# Patient Record
Sex: Male | Born: 1985 | Race: White | Hispanic: No | Smoking: Current every day smoker
Health system: Southern US, Community
[De-identification: ages and names within clinical notes are randomized; demographics above are authoritative.]

## PROBLEM LIST (undated history)

## (undated) DIAGNOSIS — F1920 Other psychoactive substance dependence, uncomplicated: Secondary | ICD-10-CM

## (undated) DIAGNOSIS — B1920 Unspecified viral hepatitis C without hepatic coma: Secondary | ICD-10-CM

## (undated) DIAGNOSIS — F1910 Other psychoactive substance abuse, uncomplicated: Secondary | ICD-10-CM

## (undated) DIAGNOSIS — F17200 Nicotine dependence, unspecified, uncomplicated: Secondary | ICD-10-CM

## (undated) DIAGNOSIS — M4622 Osteomyelitis of vertebra, cervical region: Secondary | ICD-10-CM

## (undated) HISTORY — PX: ANKLE SURGERY: SHX546

---
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
## 2017-07-31 ENCOUNTER — Encounter (HOSPITAL_COMMUNITY): Payer: Self-pay | Admitting: Emergency Medicine

## 2017-07-31 ENCOUNTER — Other Ambulatory Visit: Payer: Self-pay

## 2017-07-31 ENCOUNTER — Emergency Department (HOSPITAL_COMMUNITY): Payer: Self-pay

## 2017-07-31 ENCOUNTER — Emergency Department (HOSPITAL_COMMUNITY)
Admission: EM | Admit: 2017-07-31 | Discharge: 2017-08-01 | Disposition: A | Discharge status: Home / Self Care | Payer: Self-pay | Attending: Emergency Medicine | Admitting: Emergency Medicine

## 2017-07-31 DIAGNOSIS — R748 Abnormal levels of other serum enzymes: Secondary | ICD-10-CM | POA: Insufficient documentation

## 2017-07-31 DIAGNOSIS — R451 Restlessness and agitation: Secondary | ICD-10-CM | POA: Insufficient documentation

## 2017-07-31 DIAGNOSIS — T50901A Poisoning by unspecified drugs, medicaments and biological substances, accidental (unintentional), initial encounter: Secondary | ICD-10-CM | POA: Insufficient documentation

## 2017-07-31 DIAGNOSIS — J181 Lobar pneumonia, unspecified organism: Secondary | ICD-10-CM | POA: Insufficient documentation

## 2017-07-31 DIAGNOSIS — F1910 Other psychoactive substance abuse, uncomplicated: Secondary | ICD-10-CM | POA: Insufficient documentation

## 2017-07-31 DIAGNOSIS — J189 Pneumonia, unspecified organism: Secondary | ICD-10-CM

## 2017-07-31 DIAGNOSIS — R062 Wheezing: Secondary | ICD-10-CM

## 2017-07-31 DIAGNOSIS — F17210 Nicotine dependence, cigarettes, uncomplicated: Secondary | ICD-10-CM | POA: Insufficient documentation

## 2017-07-31 LAB — RAPID URINE DRUG SCREEN, HOSP PERFORMED
Amphetamines: POSITIVE — AB
BENZODIAZEPINES: NOT DETECTED
Cocaine: NOT DETECTED
Opiates: POSITIVE — AB
Tetrahydrocannabinol: NOT DETECTED

## 2017-07-31 LAB — COMPREHENSIVE METABOLIC PANEL
ALBUMIN: 5 g/dL (ref 3.5–5.0)
ALK PHOS: 79 U/L (ref 38–126)
ALT: 63 U/L — ABNORMAL HIGH (ref 0–44)
AST: 54 U/L — AB (ref 15–41)
Anion gap: 13 (ref 5–15)
BUN: 15 mg/dL (ref 6–20)
CALCIUM: 10 mg/dL (ref 8.9–10.3)
CO2: 25 mmol/L (ref 22–32)
CREATININE: 1.14 mg/dL (ref 0.61–1.24)
Chloride: 105 mmol/L (ref 98–111)
GFR calc Af Amer: >60 mL/min (ref >60)
GLUCOSE: 59 mg/dL — AB (ref 70–99)
Potassium: 4.3 mmol/L (ref 3.5–5.1)
Sodium: 143 mmol/L (ref 135–145)
Total Bilirubin: 0.9 mg/dL (ref 0.3–1.2)
Total Protein: 8.6 g/dL — ABNORMAL HIGH (ref 6.5–8.1)

## 2017-07-31 LAB — CBC
HEMATOCRIT: 38.7 % — AB (ref 39.0–52.0)
Hemoglobin: 13 g/dL (ref 13.0–17.0)
MCH: 28.8 pg (ref 26.0–34.0)
MCHC: 33.6 g/dL (ref 30.0–36.0)
MCV: 85.8 fL (ref 78.0–100.0)
Platelets: 307 10*3/uL (ref 150–400)
RBC: 4.51 MIL/uL (ref 4.22–5.81)
RDW: 14.6 % (ref 11.5–15.5)
WBC: 16.1 10*3/uL — ABNORMAL HIGH (ref 4.0–10.5)

## 2017-07-31 LAB — CBG MONITORING, ED: GLUCOSE-CAPILLARY: 87 mg/dL (ref 70–99)

## 2017-07-31 LAB — ETHANOL: Alcohol, Ethyl (B): <10 mg/dL (ref <10)

## 2017-07-31 LAB — TROPONIN I

## 2017-07-31 LAB — ACETAMINOPHEN LEVEL: Acetaminophen (Tylenol), Serum: <10 ug/mL — ABNORMAL LOW (ref 10–30)

## 2017-07-31 LAB — SALICYLATE LEVEL: Salicylate Lvl: <7 mg/dL (ref 2.8–30.0)

## 2017-07-31 IMAGING — CR DG CHEST 1V
1 series · 1 of 1 positions shown · non-contrast
Comparison: None.

CLINICAL DATA: Shortness of breath.  Possible drug overdose.

EXAM:
CHEST  1 VIEW

[x chest ap]
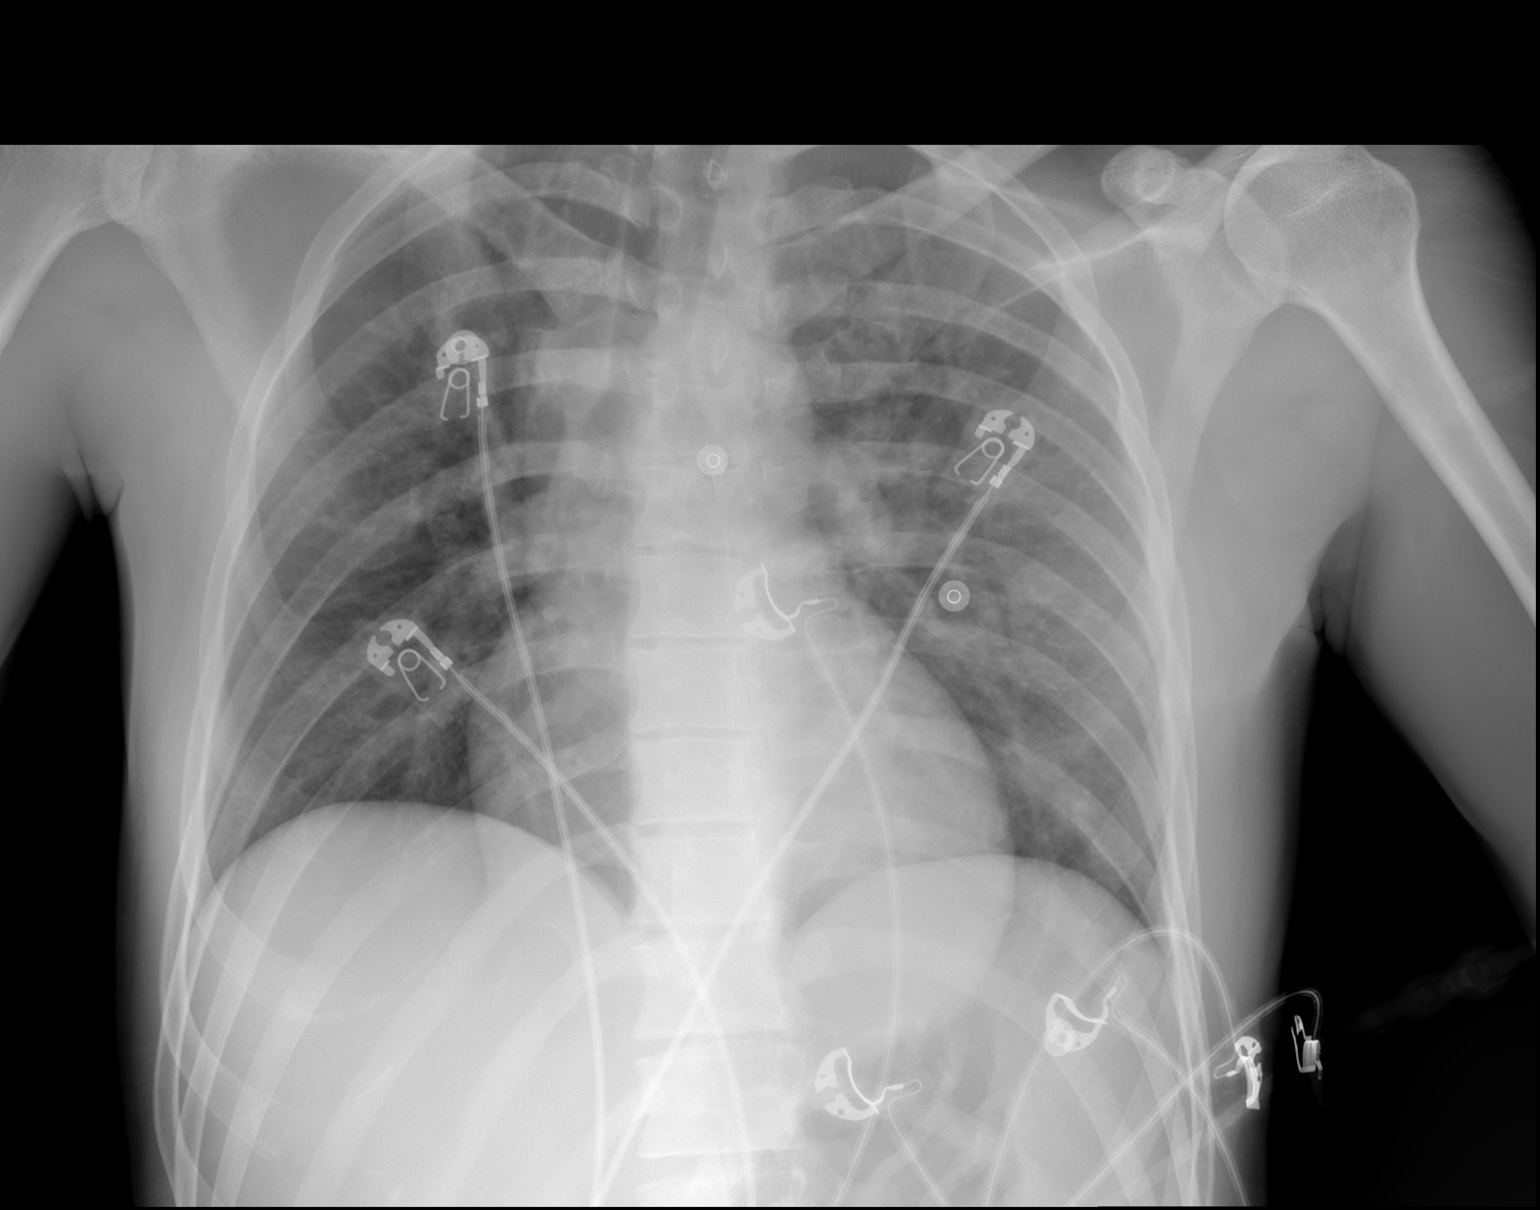

[1 of 1 positions shown; findings below may reference images not displayed]

FINDINGS: [ML] hours. Low lung volumes. Patchy airspace opacities seen in the
left mid lung. No focal airspace consolidation in the right lung.
The cardiopericardial silhouette is within normal limits for size.
The visualized bony structures of the thorax are intact. Telemetry
leads overlie the chest.
IMPRESSION: Patchy airspace disease left mid lung. Infectious/inflammatory
etiology suspected.

## 2017-07-31 MED ORDER — SODIUM CHLORIDE 0.9 % IV BOLUS (SEPSIS)
1000.0000 mL | Freq: Once | INTRAVENOUS | Status: AC
  Administered 2017-07-31: 1000 mL via INTRAVENOUS

## 2017-07-31 MED ORDER — LORAZEPAM 2 MG/ML IJ SOLN
1.0000 mg | Freq: Once | INTRAMUSCULAR | Status: AC
  Administered 2017-07-31: 1 mg via INTRAVENOUS
  Filled 2017-07-31: qty 1

## 2017-07-31 MED ORDER — SODIUM CHLORIDE 0.9 % IV SOLN
1.5000 g | Freq: Once | INTRAVENOUS | Status: AC
  Administered 2017-07-31: 1.5 g via INTRAVENOUS
  Filled 2017-07-31 (×2): qty 1.5

## 2017-07-31 MED ORDER — SODIUM CHLORIDE 0.9 % IV BOLUS (SEPSIS)
1000.0000 mL | Freq: Once | INTRAVENOUS | Status: AC
Start: 2017-07-31 — End: 2017-07-31
  Administered 2017-07-31: 1000 mL via INTRAVENOUS

## 2017-07-31 MED ORDER — SODIUM CHLORIDE 0.9 % IV SOLN
1000.0000 mL | INTRAVENOUS | Status: DC
  Administered 2017-07-31: 1000 mL via INTRAVENOUS
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
## 2017-07-31 NOTE — ED Notes (Signed)
Bed: RESB Expected date:  Expected time:  Means of arrival:  Comments: overdose 
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
## 2017-08-01 LAB — RPR: RPR Ser Ql: NONREACTIVE

## 2017-08-01 LAB — HIV ANTIBODY (ROUTINE TESTING W REFLEX): HIV SCREEN 4TH GENERATION: NONREACTIVE

## 2017-08-01 LAB — CBG MONITORING, ED: Glucose-Capillary: 64 mg/dL — ABNORMAL LOW (ref 70–99)

## 2017-08-01 MED ORDER — AMOXICILLIN-POT CLAVULANATE 875-125 MG PO TABS: 1.0000 | ORAL_TABLET | Freq: Two times a day (BID) | ORAL | 0 refills | Status: AC

## 2017-08-01 MED ORDER — AZITHROMYCIN 250 MG PO TABS
500.0000 mg | ORAL_TABLET | Freq: Once | ORAL | Status: AC
  Administered 2017-08-01: 500 mg via ORAL
  Filled 2017-08-01: qty 2

## 2017-08-01 MED ORDER — AZITHROMYCIN 250 MG PO TABS: 250.0000 mg | ORAL_TABLET | Freq: Every day | ORAL | 0 refills | Status: AC

## 2017-08-01 MED ORDER — AMOXICILLIN-POT CLAVULANATE 875-125 MG PO TABS: 1.0000 | ORAL_TABLET | Freq: Two times a day (BID) | ORAL | 0 refills | Status: DC

## 2017-08-01 MED ORDER — AZITHROMYCIN 250 MG PO TABS: 250.0000 mg | ORAL_TABLET | Freq: Every day | ORAL | 0 refills | Status: DC

## 2017-08-01 MED ORDER — AMMONIA AROMATIC IN INHA
RESPIRATORY_TRACT | Status: AC
  Administered 2017-08-01: 3 mL
  Filled 2017-08-01: qty 10

## 2017-08-01 NOTE — Discharge Instructions (Signed)
Please see the information and instructions below regarding your visit.  Your diagnoses today include:  1. Accidental drug overdose, initial encounter   2. Wheezing     Tests performed today include: See side panel of your discharge paperwork for testing performed today. Vital signs are listed at the bottom of these instructions.   Your chest x-ray shows that you have pneumonia.  This is likely due to aspiration of fluid.  We will treat you with 2 antibiotics.  See the information about these antibiotics below.  He will need a repeat chest x-ray to make sure it is cleared in 3 to 4 weeks.  Medications prescribed:    Take any prescribed medications only as prescribed, and any over the counter medications only as directed on the packaging.  Please take all of your antibiotics until finished.   You are prescribed 2 antibiotics, azithromycin, 250 mg once a day, and Augmentin twice a day for 7 days.  You may develop abdominal discomfort or nausea from the antibiotic. If this occurs, you may take it with food. Some patients also get diarrhea with antibiotics. You may help offset this with probiotics which you can buy or get in yogurt. Do not eat or take the probiotics until 2 hours after your antibiotic. Some women develop vaginal yeast infections after antibiotics. If you develop unusual vaginal discharge after being on this medication, please see your primary care provider.   Some people develop allergies to antibiotics. Symptoms of antibiotic allergy can be mild and include a flat rash and itching. They can also be more serious and include.  ?Hives - Hives are raised, red patches of skin that are usually very itchy.  ?Lip or tongue swelling  ?Trouble swallowing or breathing  ?Blistering of the skin or mouth.  If you have any of these serious symptoms, please seek emergency medical care immediately.   Home care instructions:  Please follow any educational materials contained in this  packet.   Follow-up instructions: Please follow-up with your primary care provider as soon as possible for further evaluation of your symptoms if they are not completely improved.  Please contact the primary care provider as listed in this paperwork.  I also placed a consult to case management to assist you from a primary care provider.  You may need to set up primary care in your community if you are not a [place] resident.  You may go to [place] urgent care in 3 to 4 weeks to get a repeat chest x-ray.  At that time, I recommend repeat liver enzymes.  They were slightly elevated today.  This is nonspecific.  I listed  information in this paperwork regarding substance use treatment options outpatient.  Alternatively, you got some resources for housing for substance use from the Child psychotherapistsocial worker.  Please review this information.  Return instructions:  Please return to the Emergency Department if you experience worsening symptoms.  Please return to the emergency department for any chest pain, shortness of breath, dizziness or lightheadedness, or any new or worsening symptoms. Please return if you have any other emergent concerns.  Additional Information:   Your vital signs today were: BP 90/61 (BP Location: Right Arm)    Pulse 65    Temp 98 F (36.7 C) (Oral)    Resp 12    SpO2 98%  If your blood pressure (BP) was elevated on multiple readings during this visit above 130 for the top number or above 80 for the bottom number, please  have this repeated by your primary care provider within one month. --------------  Thank you for allowing [place] to participate in your care today.
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
## 2017-08-01 NOTE — Care Management Note (Signed)
Case Management Note  CM consulted for no pcp and no ins.  CM noted multiple clinics provided on pt's AVS at time of D/C that pt can call to follow up with.  Pt also does not live locally.  He normally receives resources from [facility] in the [place] area.  Pt is able to follow up with their resources also as he does not normally come to [place].  No further CM needs noted at this time.  Jasiyah Poland, Lynnae SandhoffAngela N, RN 08/01/2017, 9:03 AM

## 2019-11-17 ENCOUNTER — Inpatient Hospital Stay (HOSPITAL_COMMUNITY)
Admission: EM | Admit: 2019-11-17 | Discharge: 2019-12-13 | DRG: 540 | Discharge status: Against Medical Advice | Payer: Self-pay | Attending: Internal Medicine | Admitting: Internal Medicine

## 2019-11-17 ENCOUNTER — Other Ambulatory Visit: Payer: Self-pay

## 2019-11-17 ENCOUNTER — Encounter (HOSPITAL_COMMUNITY): Payer: Self-pay

## 2019-11-17 DIAGNOSIS — M47812 Spondylosis without myelopathy or radiculopathy, cervical region: Secondary | ICD-10-CM | POA: Diagnosis present

## 2019-11-17 DIAGNOSIS — F17200 Nicotine dependence, unspecified, uncomplicated: Secondary | ICD-10-CM | POA: Diagnosis present

## 2019-11-17 DIAGNOSIS — M4622 Osteomyelitis of vertebra, cervical region: Principal | ICD-10-CM | POA: Diagnosis present

## 2019-11-17 DIAGNOSIS — F1910 Other psychoactive substance abuse, uncomplicated: Secondary | ICD-10-CM | POA: Diagnosis present

## 2019-11-17 DIAGNOSIS — Z20822 Contact with and (suspected) exposure to covid-19: Secondary | ICD-10-CM | POA: Diagnosis present

## 2019-11-17 DIAGNOSIS — M4652 Other infective spondylopathies, cervical region: Secondary | ICD-10-CM | POA: Diagnosis present

## 2019-11-17 DIAGNOSIS — Z5329 Procedure and treatment not carried out because of patient's decision for other reasons: Secondary | ICD-10-CM | POA: Diagnosis present

## 2019-11-17 DIAGNOSIS — J45909 Unspecified asthma, uncomplicated: Secondary | ICD-10-CM | POA: Diagnosis present

## 2019-11-17 DIAGNOSIS — F1520 Other stimulant dependence, uncomplicated: Secondary | ICD-10-CM | POA: Diagnosis present

## 2019-11-17 DIAGNOSIS — B1920 Unspecified viral hepatitis C without hepatic coma: Secondary | ICD-10-CM | POA: Diagnosis present

## 2019-11-17 DIAGNOSIS — F17210 Nicotine dependence, cigarettes, uncomplicated: Secondary | ICD-10-CM | POA: Diagnosis present

## 2019-11-17 DIAGNOSIS — F1920 Other psychoactive substance dependence, uncomplicated: Secondary | ICD-10-CM | POA: Diagnosis present

## 2019-11-17 DIAGNOSIS — F1123 Opioid dependence with withdrawal: Secondary | ICD-10-CM | POA: Diagnosis present

## 2019-11-17 DIAGNOSIS — M009 Pyogenic arthritis, unspecified: Secondary | ICD-10-CM | POA: Diagnosis present

## 2019-11-17 DIAGNOSIS — Z5900 Homelessness unspecified: Secondary | ICD-10-CM

## 2019-11-17 DIAGNOSIS — M4650 Other infective spondylopathies, site unspecified: Secondary | ICD-10-CM

## 2019-11-17 HISTORY — DX: Other psychoactive substance dependence, uncomplicated: F19.20

## 2019-11-17 HISTORY — DX: Unspecified viral hepatitis C without hepatic coma: B19.20

## 2019-11-17 HISTORY — DX: Other psychoactive substance abuse, uncomplicated: F19.10

## 2019-11-17 HISTORY — DX: Nicotine dependence, unspecified, uncomplicated: F17.200

## 2019-11-17 HISTORY — DX: Osteomyelitis of vertebra, cervical region: M46.22

## 2019-11-17 LAB — CBC WITH DIFFERENTIAL/PLATELET
Abs Immature Granulocytes: 0.01 10*3/uL (ref 0.00–0.07)
Basophils Absolute: 0 10*3/uL (ref 0.0–0.1)
Basophils Relative: 1 %
Eosinophils Absolute: 0.1 10*3/uL (ref 0.0–0.5)
Eosinophils Relative: 2 %
HCT: 35 % — ABNORMAL LOW (ref 39.0–52.0)
Hemoglobin: 11 g/dL — ABNORMAL LOW (ref 13.0–17.0)
Immature Granulocytes: 0 %
Lymphocytes Relative: 38 %
Lymphs Abs: 2.1 10*3/uL (ref 0.7–4.0)
MCH: 26.4 pg (ref 26.0–34.0)
MCHC: 31.4 g/dL (ref 30.0–36.0)
MCV: 84.1 fL (ref 80.0–100.0)
Monocytes Absolute: 0.7 10*3/uL (ref 0.1–1.0)
Monocytes Relative: 13 %
Neutro Abs: 2.6 10*3/uL (ref 1.7–7.7)
Neutrophils Relative %: 46 %
Platelets: 253 10*3/uL (ref 150–400)
RBC: 4.16 MIL/uL — ABNORMAL LOW (ref 4.22–5.81)
RDW: 15.5 % (ref 11.5–15.5)
WBC: 5.4 10*3/uL (ref 4.0–10.5)
nRBC: 0 % (ref 0.0–0.2)

## 2019-11-17 LAB — COMPREHENSIVE METABOLIC PANEL
ALT: 47 U/L — ABNORMAL HIGH (ref 0–44)
AST: 33 U/L (ref 15–41)
Albumin: 4.1 g/dL (ref 3.5–5.0)
Alkaline Phosphatase: 83 U/L (ref 38–126)
Anion gap: 13 (ref 5–15)
BUN: 23 mg/dL — ABNORMAL HIGH (ref 6–20)
CO2: 24 mmol/L (ref 22–32)
Calcium: 9.6 mg/dL (ref 8.9–10.3)
Chloride: 99 mmol/L (ref 98–111)
Creatinine, Ser: 0.93 mg/dL (ref 0.61–1.24)
GFR, Estimated: >60 mL/min (ref >60)
Glucose, Bld: 98 mg/dL (ref 70–99)
Potassium: 3.7 mmol/L (ref 3.5–5.1)
Sodium: 136 mmol/L (ref 135–145)
Total Bilirubin: 0.5 mg/dL (ref 0.3–1.2)
Total Protein: 8.1 g/dL (ref 6.5–8.1)

## 2019-11-17 MED ORDER — SODIUM CHLORIDE 0.9 % IV SOLN: 1.0000 g | Freq: Two times a day (BID) | INTRAVENOUS | Status: DC

## 2019-11-17 MED ORDER — SODIUM CHLORIDE 0.9 % IV SOLN
2.0000 g | Freq: Two times a day (BID) | INTRAVENOUS | Status: DC
  Administered 2019-11-18 – 2019-11-22 (×8): 2 g via INTRAVENOUS
  Filled 2019-11-17 (×8): qty 20

## 2019-11-17 MED ORDER — VANCOMYCIN HCL 1500 MG/300ML IV SOLN
1500.0000 mg | Freq: Two times a day (BID) | INTRAVENOUS | Status: DC
  Administered 2019-11-18 – 2019-11-19 (×4): 1500 mg via INTRAVENOUS
  Filled 2019-11-17 (×5): qty 300

## 2019-11-17 NOTE — Progress Notes (Signed)
Pharmacy Antibiotic Note  Daniel Raymond is a 34 y.o. male admitted on 11/17/2019 with discitis, left outside hospital AMA.  Pharmacy has been consulted for vancomycin dosing.  BCx from [facility] 10/18 ngtd, therapeutic lvls on vancomycin 1500mg  IV every 12 hours with last dose charted 10/26 AM before pt left AMA.  Also was on ceftriaxone 2g q24h, per MD.    Plan: Vancomycin 1500 mg IV every 12 hours Monitor renal function, clinical progression and LOT Vancomycin trough as needed  Height: 5\' 10"  (177.8 cm) Weight: 72.6 kg (160 lb) IBW/kg (Calculated) : 73  Temp (24hrs), Avg:98.7 F (37.1 C), Min:98.7 F (37.1 C), Max:98.7 F (37.1 C)  Recent Labs  Lab 11/17/19 1827  WBC 5.4  CREATININE 0.93    Estimated Creatinine Clearance: 114.9 mL/min (by C-G formula based on SCr of 0.93 mg/dL).    Allergies  Allergen Reactions  . Ibuprofen Other (See Comments)    Stomach pains    , PharmD Clinical Pharmacist ED Pharmacist Phone # [phone] 11/17/2019 11:04 PM

## 2019-11-17 NOTE — ED Provider Notes (Signed)
[facility] EMERGENCY DEPARTMENT Provider Note   CSN: [id number] Arrival date & time: 11/17/19  1750     History Chief Complaint  Patient presents with  . Wound Infection    Daniel Raymond is a 34 y.o. male.  Patient presents to the emergency department with a chief complaint of discitis.  He states that he was recently admitted at [facility] for the past 9 days.  He left yesterday AMA, after becoming dissatisfied with the care he is receiving from the nurses.  He has hx of IVDU.  States that he had a PICC line in the hospital, but while in the hospital used some heroin and pulled his PICC line out while intoxicated.  He states that he continues to have upper and lower back pain.  He states that he has been having BMs and urinating, though he does report some urinary urgency.  He denies weakness or numbness.  He is ambulatory.  He is homeless.  He would like addiction recovery help.  He denies SI/HI.  The history is provided by the patient. No language interpreter was used.       History reviewed. No pertinent past medical history.  There are no problems to display for this patient.   History reviewed. No pertinent surgical history.     No family history on file.  Social History   Tobacco Use  . Smoking status: Current Every Day Smoker    Types: Cigarettes  . Smokeless tobacco: Never Used  Vaping Use  . Vaping Use: Never used  Substance Use Topics  . Alcohol use: Not Currently  . Drug use: Yes    Types: Methamphetamines    Home Medications Prior to Admission medications   Not on File    Allergies    Ibuprofen  Review of Systems   Review of Systems  All other systems reviewed and are negative.   Physical Exam Updated Vital Signs BP 116/88 (BP Location: Right Arm)   Pulse (!) 112   Temp 98.7 F (37.1 C) (Oral)   Resp 14   Ht 5' 10"  (1.778 m)   Wt 72.6 kg   SpO2 100%   BMI 22.96 kg/m   Physical Exam Vitals and nursing note reviewed.   Constitutional:      Appearance: He is well-developed.  HENT:     Head: Normocephalic and atraumatic.  Eyes:     Conjunctiva/sclera: Conjunctivae normal.  Cardiovascular:     Rate and Rhythm: Normal rate and regular rhythm.     Heart sounds: No murmur heard.   Pulmonary:     Effort: Pulmonary effort is normal. No respiratory distress.     Breath sounds: Normal breath sounds.  Abdominal:     Palpations: Abdomen is soft.     Tenderness: There is no abdominal tenderness.  Musculoskeletal:        General: Normal range of motion.     Cervical back: Neck supple.  Skin:    General: Skin is warm and dry.  Neurological:     Mental Status: He is alert and oriented to person, place, and time.  Psychiatric:        Mood and Affect: Mood normal.        Behavior: Behavior normal.     ED Results / Procedures / Treatments   Labs (all labs ordered are listed, but only abnormal results are displayed) Labs Reviewed  CBC WITH DIFFERENTIAL/PLATELET - Abnormal; Notable for the following components:  Result Value   RBC 4.16 (*)    Hemoglobin 11.0 (*)    HCT 35.0 (*)    All other components within normal limits  COMPREHENSIVE METABOLIC PANEL - Abnormal; Notable for the following components:   BUN 23 (*)    ALT 47 (*)    All other components within normal limits  RESPIRATORY PANEL BY RT PCR (FLU A&B, COVID)  URINALYSIS, ROUTINE W REFLEX MICROSCOPIC  SEDIMENTATION RATE  C-REACTIVE PROTEIN    EKG None  Radiology MR Cervical Spine W or Wo Contrast  Result Date: 11/18/2019 CLINICAL DATA:  History of discitis-osteomyelitis.  Neck pain EXAM: MRI CERVICAL SPINE WITHOUT AND WITH CONTRAST TECHNIQUE: Multiplanar and multiecho pulse sequences of the cervical spine, to include the craniocervical junction and cervicothoracic junction, were obtained without and with intravenous contrast. CONTRAST:  7.61m GADAVIST GADOBUTROL 1 MMOL/ML IV SOLN COMPARISON:  CT cervical spine 05/07/2019 FINDINGS:  Alignment: Reversal of normal cervical lordosis Vertebrae: There is hyperintense T2 and hypointense T1-weighted signal at C6 and C7. There is also mild contrast enhancement at both locations. There is no abnormal signal within the discs. There is abnormal signal and contrast enhancement at the left C2-3 facet joint. Cord: Normal Posterior Fossa, vertebral arteries, paraspinal tissues: Negative. Disc levels: C1-2: Unremarkable. C2-3: Left uncovertebral hypertrophy. There is no spinal canal stenosis. Mild left neural foraminal stenosis. C3-4: Bilateral uncovertebral hypertrophy. There is no spinal canal stenosis. Mild bilateral neural foraminal stenosis. C4-5: Normal disc space and facet joints. There is no spinal canal stenosis. No neural foraminal stenosis. C5-6: Disc space narrowing with endplate ridging. Mild spinal canal stenosis. Mild right neural foraminal stenosis. C6-7: Small disc bulge with left-greater-than-right uncovertebral hypertrophy. There is no spinal canal stenosis. Mild bilateral neural foraminal stenosis. C7-T1: Normal disc space and facet joints. There is no spinal canal stenosis. No neural foraminal stenosis. IMPRESSION: 1. There is abnormal signal and contrast enhancement at the left C2-3 facet joint, which is concerning for septic arthritis. 2. No evidence for active discitis-osteomyelitis. Signal changes at C6-7 could be degenerative or related to chronic osteomyelitis. 3. Mild spinal canal stenosis at C5-6. 4. Mild multilevel neural foraminal stenosis, including on the left at C2-3 and bilaterally at C3-4 and C6-7. Electronically Signed   By: KUlyses JarredM.D.   On: 11/18/2019 03:38    Procedures Procedures (including critical care time)  Medications Ordered in ED Medications - No data to display  ED Course  I have reviewed the triage vital signs and the nursing notes.  Pertinent labs & imaging results that were available during my care of the patient were reviewed by me and  considered in my medical decision making (see chart for details).    MDM Rules/Calculators/A&P                          This patient complains of neck pain and back pain. [facility] He has diagnosed C2-3 septic arthritis.  Pertinent Labs I ordered, reviewed, and interpreted labs, which included CBC, CMP, CRP, and ESR. No significant leukocytosis. No electrolyte derangement. CRP and ESR pending.  Imaging Interpretation I ordered imaging studies which included MR with and without contrast cervical spine, which showed findings consistent with septic arthritis at C2-3, additional findings as listed above.   Medications I ordered medication vancomycin and ceftriaxone for septic arthritis.  Sources [facility].  Negative blood cultures during  last admission. Patient was seen by neurosurgery and infectious disease. Plan was for no surgical or interventional radiology intervention.    Critical Interventions  Broad-spectrum antibiotics for septic arthritis.  Reassessments After the interventions stated above, I reevaluated the patient and found stable for admission.  Consultants Spoke with neurosurgery PA, Maudie Mercury, who states no intervention without epidural involvement. Available for consult if needed, but not planning to consult at this time.  Dr. Marlowe Sax, [facility]: Lambert Rhonda W, who is appreciated for admitting the patient.  Plan Admit to medicine.    Final Clinical Impression(s) / ED Diagnoses Final diagnoses:  Septic arthritis of vertebra, due to unspecified organism [facility])    Rx / [place] Orders ED Discharge Orders    None       Montine Circle, PA-C 11/18/19 0443    Drenda Freeze, MD 11/20/19 [phone]

## 2019-11-17 NOTE — ED Triage Notes (Signed)
Pt reports with an infection to his spine. Pt states [facility], didn't like the way they were treating him so he hitched a ride here to be readmitted for IV ABX.

## 2019-11-18 ENCOUNTER — Emergency Department (HOSPITAL_COMMUNITY): Payer: Medicaid Other

## 2019-11-18 ENCOUNTER — Emergency Department (HOSPITAL_COMMUNITY): Payer: Self-pay

## 2019-11-18 ENCOUNTER — Encounter (HOSPITAL_COMMUNITY): Payer: Self-pay | Admitting: Internal Medicine

## 2019-11-18 ENCOUNTER — Inpatient Hospital Stay: Payer: Self-pay

## 2019-11-18 DIAGNOSIS — F17200 Nicotine dependence, unspecified, uncomplicated: Secondary | ICD-10-CM | POA: Diagnosis present

## 2019-11-18 DIAGNOSIS — M009 Pyogenic arthritis, unspecified: Secondary | ICD-10-CM | POA: Diagnosis present

## 2019-11-18 DIAGNOSIS — F1920 Other psychoactive substance dependence, uncomplicated: Secondary | ICD-10-CM | POA: Diagnosis present

## 2019-11-18 DIAGNOSIS — B1920 Unspecified viral hepatitis C without hepatic coma: Secondary | ICD-10-CM | POA: Diagnosis present

## 2019-11-18 DIAGNOSIS — F1910 Other psychoactive substance abuse, uncomplicated: Secondary | ICD-10-CM | POA: Diagnosis present

## 2019-11-18 DIAGNOSIS — M4622 Osteomyelitis of vertebra, cervical region: Secondary | ICD-10-CM | POA: Diagnosis present

## 2019-11-18 LAB — RAPID URINE DRUG SCREEN, HOSP PERFORMED
Amphetamines: POSITIVE — AB
Barbiturates: NOT DETECTED
Benzodiazepines: NOT DETECTED
Cocaine: NOT DETECTED
Opiates: NOT DETECTED
Tetrahydrocannabinol: NOT DETECTED

## 2019-11-18 LAB — URINALYSIS, ROUTINE W REFLEX MICROSCOPIC
Bilirubin Urine: NEGATIVE
Glucose, UA: 150 mg/dL — AB
Ketones, ur: NEGATIVE mg/dL
Leukocytes,Ua: NEGATIVE
Nitrite: NEGATIVE
Protein, ur: 100 mg/dL — AB
Specific Gravity, Urine: 1.031 — ABNORMAL HIGH (ref 1.005–1.030)
pH: 6 (ref 5.0–8.0)

## 2019-11-18 LAB — RESPIRATORY PANEL BY RT PCR (FLU A&B, COVID)
Influenza A by PCR: NEGATIVE
Influenza B by PCR: NEGATIVE
SARS Coronavirus 2 by RT PCR: NEGATIVE

## 2019-11-18 LAB — SEDIMENTATION RATE: Sed Rate: 27 mm/hr — ABNORMAL HIGH (ref 0–16)

## 2019-11-18 LAB — C-REACTIVE PROTEIN: CRP: 0.9 mg/dL (ref <1.0)

## 2019-11-18 LAB — HIV ANTIBODY (ROUTINE TESTING W REFLEX): HIV Screen 4th Generation wRfx: NONREACTIVE

## 2019-11-18 IMAGING — MR MR CERVICAL SPINE WO/W CM
6 of 8 series · 30 of 48 positions shown · IV contrast (gadavist)
Comparison: CT cervical spine [DATE]

CLINICAL DATA: History of discitis-osteomyelitis.  Neck pain

EXAM:
MRI CERVICAL SPINE WITHOUT AND WITH CONTRAST
TECHNIQUE: Multiplanar and multiecho pulse sequences of the cervical spine, to
include the craniocervical junction and cervicothoracic junction,
were obtained without and with intravenous contrast.
CONTRAST:  7.5mL GADAVIST GADOBUTROL 1 MMOL/ML IV SOLN

[Series 5: T2 · sagittal · 3.0mm · 0.69mm/px · 3 of 15 slices shown (1 of 2)]
[im 1/15]
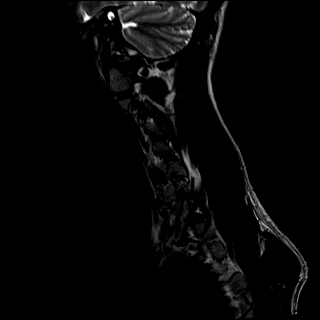
[im 8/15]
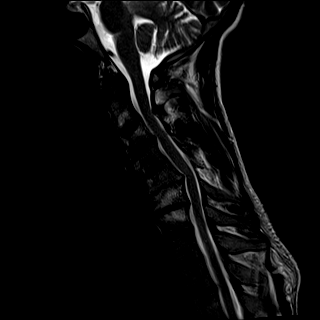
[im 15/15]
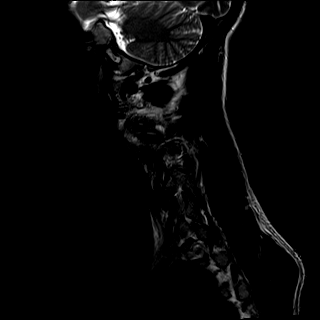

[Series 6: T1 · sagittal · 3.0mm · 0.69mm/px · 3 of 15 slices shown (1 of 2)]
[im 1/15]
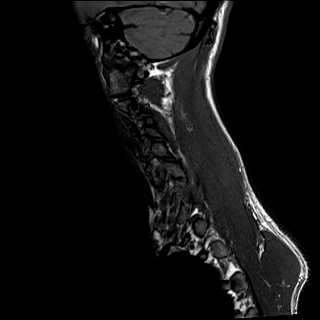
[im 8/15]
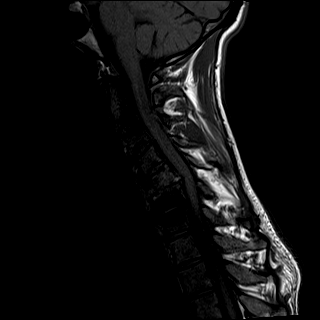
[im 15/15]
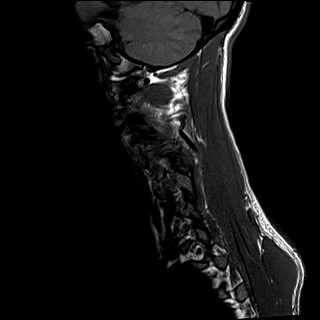

[Series 7: STIR · sagittal · 3.0mm · 0.86mm/px · 3 of 15 slices shown]
[im 1/15]
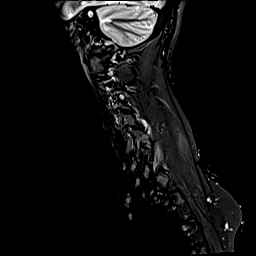
[im 8/15]
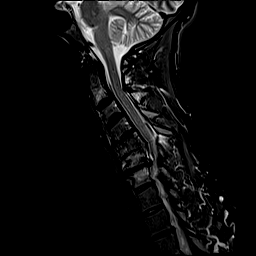
[im 15/15]
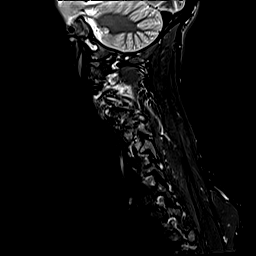

[Series 8: T2 · axial · 3.0mm · 0.66mm/px · z∈[-113,+6]mm · 9 of 40 slices shown (2 of 2)]
[im 1/40]
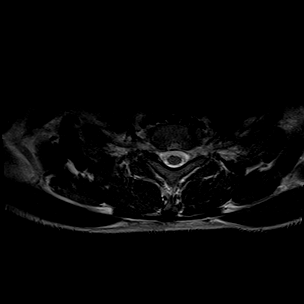
[im 5/40]
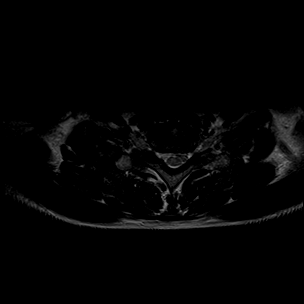
[im 10/40]
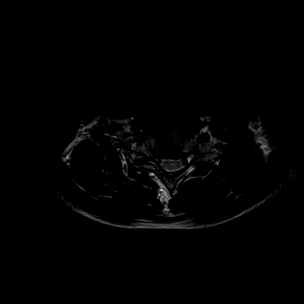
[im 15/40]
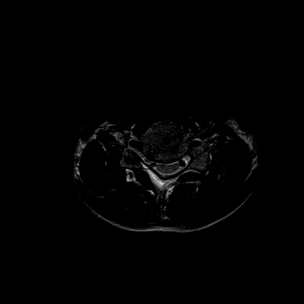
[im 20/40]
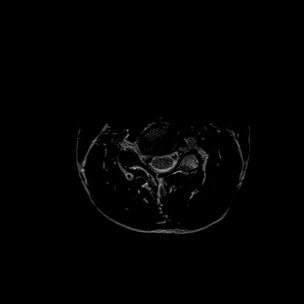
[im 25/40]
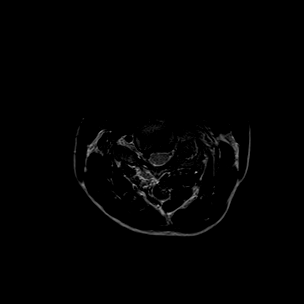
[im 30/40]
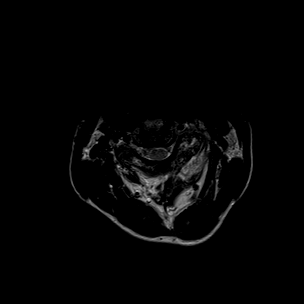
[im 35/40]
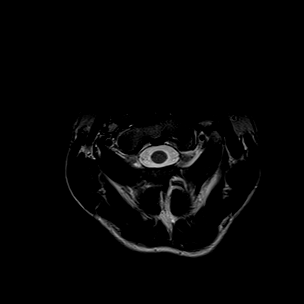
[im 40/40]
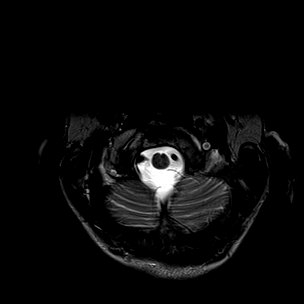

[Series 10: T1 · axial · 3.0mm · 0.39mm/px · z∈[-113,+6]mm · 9 of 40 slices shown (2 of 2)]
[im 1/40]
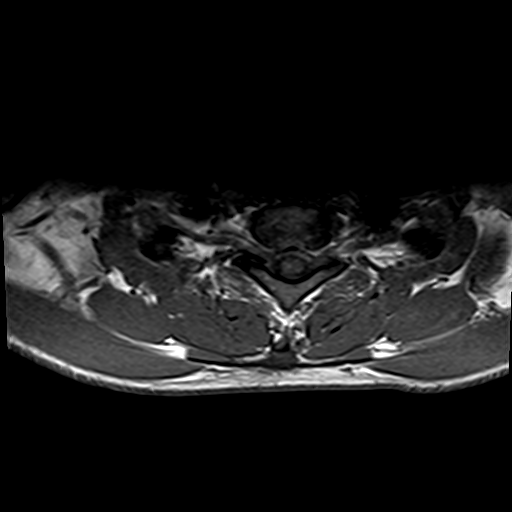
[im 5/40]
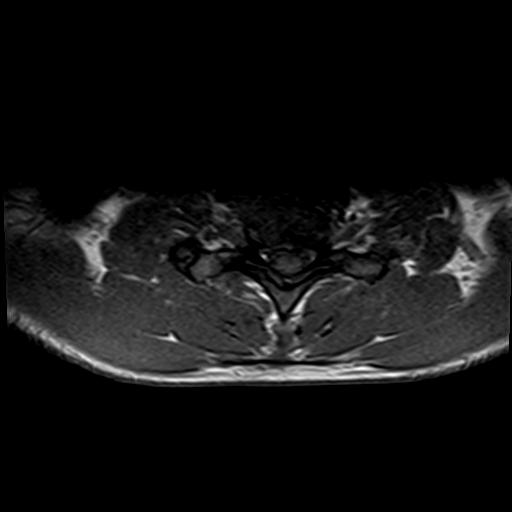
[im 10/40]
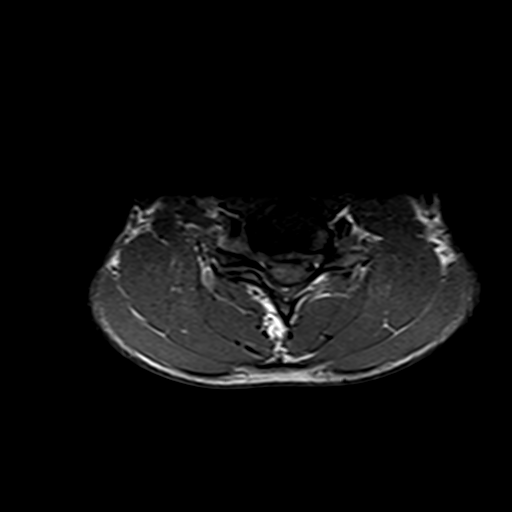
[im 15/40]
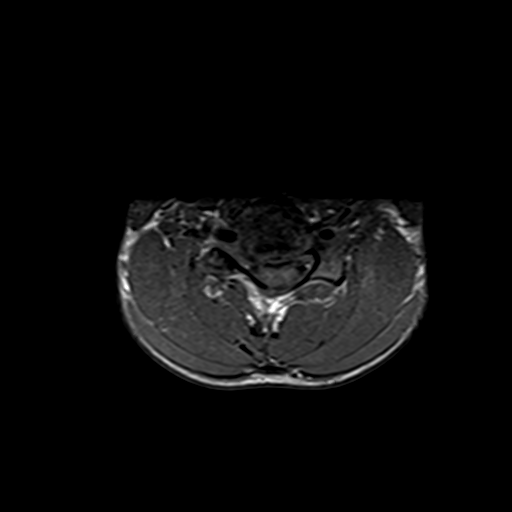
[im 20/40]
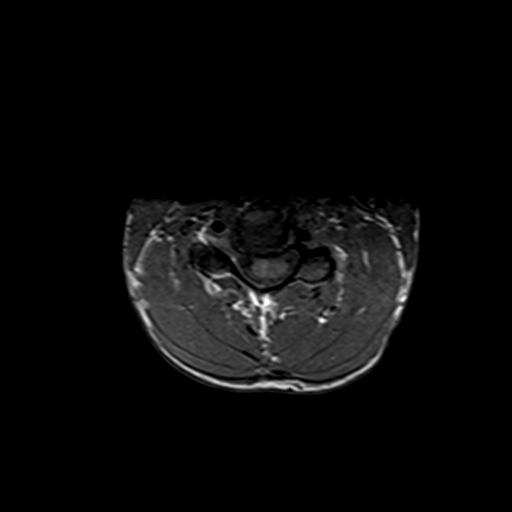
[im 25/40]
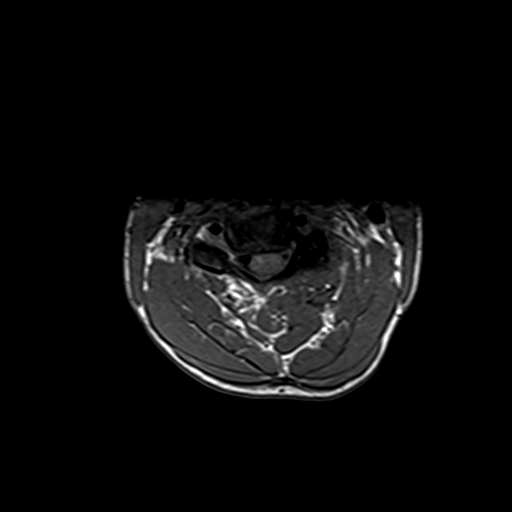
[im 30/40]
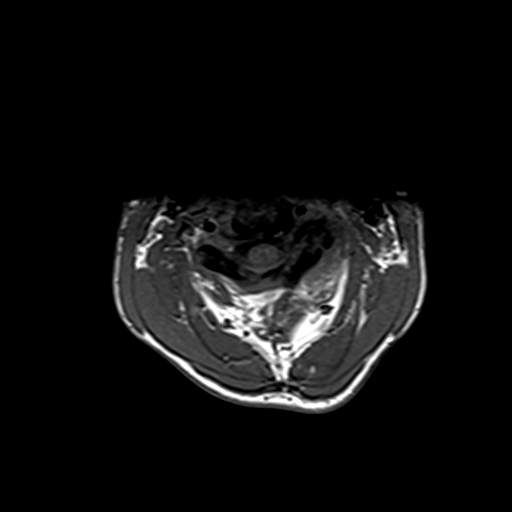
[im 35/40]
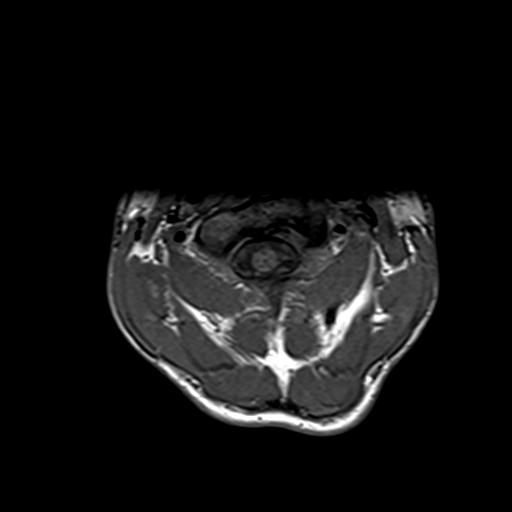
[im 40/40]
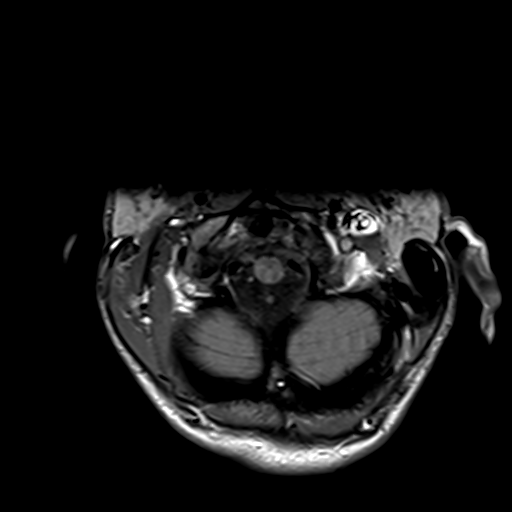

[Series 11: T1 fat-sat post-contrast · sagittal · 3.0mm · 0.43mm/px · 3 of 15 slices shown]
[im 1/15]
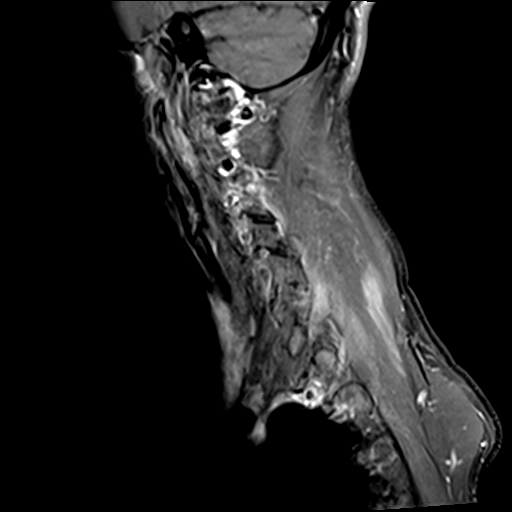
[im 8/15]
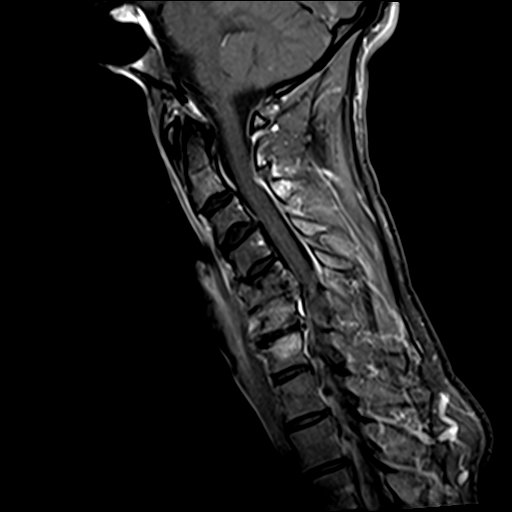
[im 15/15]
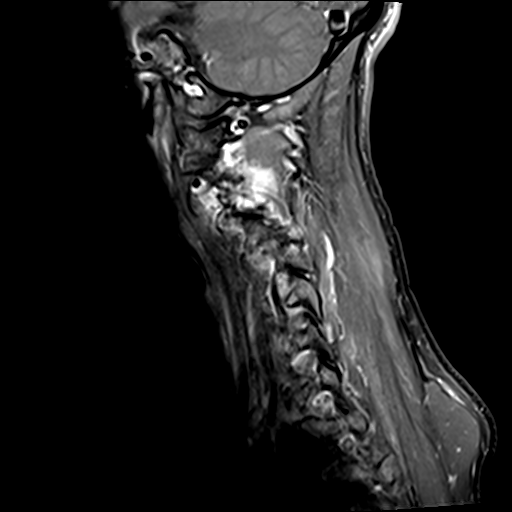

[30 of 48 positions shown; findings below may reference images not displayed]

FINDINGS: Alignment: Reversal of normal cervical lordosis

Vertebrae: There is hyperintense T2 and hypointense T1-weighted
signal at C6 and C7. There is also mild contrast enhancement at both
locations. There is no abnormal signal within the discs. There is
abnormal signal and contrast enhancement at the left C2-3 facet
joint.

Cord: Normal

Posterior Fossa, vertebral arteries, paraspinal tissues: Negative.

Disc levels:

C1-2: Unremarkable.

C2-3: Left uncovertebral hypertrophy. There is no spinal canal
stenosis. Mild left neural foraminal stenosis.

C3-4: Bilateral uncovertebral hypertrophy. There is no spinal canal
stenosis. Mild bilateral neural foraminal stenosis.

C4-5: Normal disc space and facet joints. There is no spinal canal
stenosis. No neural foraminal stenosis.

C5-6: Disc space narrowing with endplate ridging. Mild spinal canal
stenosis. Mild right neural foraminal stenosis.

C6-7: Small disc bulge with left-greater-than-right uncovertebral
hypertrophy. There is no spinal canal stenosis. Mild bilateral
neural foraminal stenosis.

C7-T1: Normal disc space and facet joints. There is no spinal canal
stenosis. No neural foraminal stenosis.
IMPRESSION: 1. There is abnormal signal and contrast enhancement at the left
C2-3 facet joint, which is concerning for septic arthritis.
2. No evidence for active discitis-osteomyelitis. Signal changes at
C6-7 could be degenerative or related to chronic osteomyelitis.
3. Mild spinal canal stenosis at C5-6.
4. Mild multilevel neural foraminal stenosis, including on the left
at C2-3 and bilaterally at C3-4 and C6-7.

## 2019-11-18 MED ORDER — LOPERAMIDE HCL 2 MG PO CAPS: 2.0000 mg | ORAL_CAPSULE | ORAL | Status: AC | PRN

## 2019-11-18 MED ORDER — ONDANSETRON HCL 4 MG/2ML IJ SOLN: 4.0000 mg | Freq: Four times a day (QID) | INTRAMUSCULAR | Status: DC | PRN

## 2019-11-18 MED ORDER — GADOBUTROL 1 MMOL/ML IV SOLN
7.5000 mL | Freq: Once | INTRAVENOUS | Status: AC | PRN
  Administered 2019-11-18: 7.5 mL via INTRAVENOUS

## 2019-11-18 MED ORDER — ONDANSETRON HCL 4 MG PO TABS: 4.0000 mg | ORAL_TABLET | Freq: Four times a day (QID) | ORAL | Status: DC | PRN

## 2019-11-18 MED ORDER — HYDRALAZINE HCL 20 MG/ML IJ SOLN: 5.0000 mg | INTRAMUSCULAR | Status: DC | PRN

## 2019-11-18 MED ORDER — CLONIDINE HCL 0.1 MG PO TABS
0.1000 mg | ORAL_TABLET | Freq: Four times a day (QID) | ORAL | Status: DC
  Administered 2019-11-18 – 2019-11-19 (×5): 0.1 mg via ORAL
  Filled 2019-11-18 (×6): qty 1

## 2019-11-18 MED ORDER — ACETAMINOPHEN 650 MG RE SUPP
650.0000 mg | Freq: Four times a day (QID) | RECTAL | Status: DC | PRN
  Filled 2019-11-18: qty 1

## 2019-11-18 MED ORDER — BUPRENORPHINE HCL-NALOXONE HCL 8-2 MG SL SUBL
1.0000 | SUBLINGUAL_TABLET | Freq: Two times a day (BID) | SUBLINGUAL | Status: DC
  Administered 2019-11-19 – 2019-12-13 (×49): 1 via SUBLINGUAL
  Filled 2019-11-18 (×50): qty 1

## 2019-11-18 MED ORDER — CLONIDINE HCL 0.1 MG PO TABS: 0.1000 mg | ORAL_TABLET | ORAL | Status: DC

## 2019-11-18 MED ORDER — NICOTINE 14 MG/24HR TD PT24
14.0000 mg | MEDICATED_PATCH | Freq: Every day | TRANSDERMAL | Status: DC | PRN
  Filled 2019-11-18: qty 1

## 2019-11-18 MED ORDER — BUPRENORPHINE HCL-NALOXONE HCL 2-0.5 MG SL SUBL: 2.0000 | SUBLINGUAL_TABLET | SUBLINGUAL | Status: AC | PRN

## 2019-11-18 MED ORDER — DICYCLOMINE HCL 20 MG PO TABS
20.0000 mg | ORAL_TABLET | Freq: Four times a day (QID) | ORAL | Status: AC | PRN
  Filled 2019-11-18: qty 1

## 2019-11-18 MED ORDER — ACETAMINOPHEN 325 MG PO TABS
650.0000 mg | ORAL_TABLET | Freq: Four times a day (QID) | ORAL | Status: DC | PRN
  Administered 2019-11-21 – 2019-12-03 (×11): 650 mg via ORAL
  Filled 2019-11-18 (×14): qty 2

## 2019-11-18 MED ORDER — ENOXAPARIN SODIUM 40 MG/0.4ML ~~LOC~~ SOLN
40.0000 mg | SUBCUTANEOUS | Status: DC
  Administered 2019-11-18 – 2019-12-13 (×26): 40 mg via SUBCUTANEOUS
  Filled 2019-11-18 (×26): qty 0.4

## 2019-11-18 MED ORDER — CLONIDINE HCL 0.1 MG PO TABS: 0.1000 mg | ORAL_TABLET | Freq: Every day | ORAL | Status: DC

## 2019-11-18 MED ORDER — HYDROXYZINE HCL 25 MG PO TABS: 25.0000 mg | ORAL_TABLET | Freq: Four times a day (QID) | ORAL | Status: AC | PRN

## 2019-11-18 MED ORDER — ORITAVANCIN DIPHOSPHATE 400 MG IV SOLR
1200.0000 mg | Freq: Once | INTRAVENOUS | Status: AC
  Administered 2019-11-18: 1200 mg via INTRAVENOUS
  Filled 2019-11-18: qty 120

## 2019-11-18 MED ORDER — METHOCARBAMOL 500 MG PO TABS
500.0000 mg | ORAL_TABLET | Freq: Three times a day (TID) | ORAL | Status: AC | PRN
  Administered 2019-11-18 – 2019-11-23 (×4): 500 mg via ORAL
  Filled 2019-11-18 (×5): qty 1

## 2019-11-18 MED ORDER — INFLUENZA VAC SPLIT QUAD 0.5 ML IM SUSY
0.5000 mL | PREFILLED_SYRINGE | INTRAMUSCULAR | Status: DC
  Filled 2019-11-18 (×2): qty 0.5

## 2019-11-18 MED ORDER — ALBUTEROL SULFATE (2.5 MG/3ML) 0.083% IN NEBU: 2.5000 mg | INHALATION_SOLUTION | RESPIRATORY_TRACT | Status: DC | PRN

## 2019-11-18 NOTE — ED Notes (Signed)
Lunch Tray Ordered @ 1042. 

## 2019-11-18 NOTE — Progress Notes (Signed)
Psychiatric consult place for patient with intravenous drug use, recurrent life-threatening behaviors.  Patient remains in the emergency department at this time.  Will complete psych consultation once patient is on the medical floor.  Please consider peer support referral.

## 2019-11-18 NOTE — Consult Note (Signed)
[facility] for Infectious Disease    Date of Admission:  11/17/2019     Total days of antibiotics 2               Reason for Consult: Cervical Osteomyelitis    Referring Provider: Lorin Mercy  Primary Care Provider: Patient, No Pcp Per   ASSESSMENT:  Daniel Raymond is a 34 y/o male IV drug user with subacute cervical osteomyelitis and septic arthritis. Cultures from his previous hospitalization have been negative and he has left AMA on several occasions. Neurosurgery recommends continued antibiotics without surgical intervention. Given his recent history of leaving AMA will give a dose of oritivancin in the event he were decide to leave AMA. It may also be beneficial to explore weekly Delbavancin as an outpatient as another alternative. Will treat Hepatitis C as outpatient.   PLAN:  1. Continue vancomycin and ceftriaxone.  2. Opioid use disorder / substance abuse per primary team.  3. Oritivancin today.  4. Hepatitis C treatment outpatient.   Principal Problem:   Subacute osteomyelitis of cervical spine (HCC) Active Problems:   IV drug abuse ([place])   Polysubstance dependence including opioid type drug with complication, episodic abuse, with unspecified complication (HCC)   Tobacco dependence   Hepatitis C   . [START ON 11/19/2019] buprenorphine-naloxone  1 tablet Sublingual BID  . cloNIDine  0.1 mg Oral QID   Followed by  . [START ON 11/20/2019] cloNIDine  0.1 mg Oral BH-qamhs   Followed by  . [START ON 11/22/2019] cloNIDine  0.1 mg Oral QAC breakfast  . enoxaparin (LOVENOX) injection  40 mg Subcutaneous Q24H     HPI: Daniel Raymond is a 34 y.o. male with previous medical history of IVDU and diagnosis of cervical osteomyelitis in June 2021 presenting with neck pain.   Daniel Raymond was initially diagnosed with cervical osteomyelitis on 6/6/21at [facility] secondary to IV drug use with heroin where he injects into his antecubital fossa and neck. CT showed advanced  disc space height loss and endplate irregularity at C5-C6. Blood cultures were without growth. He left AMA. Then seen at [facility] on 7/24 and tested positive for gonorrhea which was treated with doxycycline. On 9/18 was seen at [facility] with 2-3 months of worsening neck pain complicated by chronic R arm numbness and tingling. MRI cervical spine concerning for C2-3 facet septic joint with mild osteomyelitis. On vancomycin and ceftriaxone. He left AMA on 9/26. Seen on 10/4 at [facility] once again and left AMA on 10/5. He most recently spent 9 days at [facility] on vancomycin and ceftriaxone leaving AMA because he did not like the care he was receiving.  Daniel Raymond presented today with worsening neck pain. MRI remains consistent with previous showing C2-C3 septic arthritis. Pain primarily on the right side of his neck and has some numbness and tingling in the right upper extremity. Neurosurgery consulted recommending medical treatment. Primary team starting buprenorphine for opioid use disorder. Currently receiving vancomycin and ceftriaxone. Most recent CRP 32 and ESR 38 from [facility].  Lab work here shows CRP 0.9 and ESR 27. Afebrile since arrival.   Review of Systems: Review of Systems  Constitutional: Negative for chills, fever and weight loss.  Respiratory: Negative for cough, shortness of breath and wheezing.   Cardiovascular: Negative for chest pain and leg swelling.  Gastrointestinal: Negative for abdominal pain, constipation, diarrhea, nausea and vomiting.  Musculoskeletal: Positive for neck pain.  Skin: Negative for  rash.  Neurological: Positive for tingling.     Past Medical History:  Diagnosis Date  . Hepatitis C   . IV drug abuse ([place])   . Polysubstance dependence including opioid type drug with complication, episodic abuse, with unspecified complication ([place])   . Subacute osteomyelitis of cervical spine (HCC)   . Tobacco dependence      Social History   Tobacco Use  . Smoking status: Current Every Day Smoker    Packs/day: 1.00    Years: 21.00    Pack years: 21.00    Types: Cigarettes  . Smokeless tobacco: Never Used  Vaping Use  . Vaping Use: Never used  Substance Use Topics  . Alcohol use: Not Currently  . Drug use: Yes    Types: Methamphetamines, Heroin, Marijuana    History reviewed. No pertinent family history.  Allergies  Allergen Reactions  . Ibuprofen Other (See Comments)    Stomach pains    OBJECTIVE: Blood pressure 118/86, pulse 72, temperature 98.1 F (36.7 C), temperature source Oral, resp. rate 15, height _0  (1.778 m), weight 72.6 kg, SpO2 100 %.  Physical Exam Constitutional:      General: He is not in acute distress.    Appearance: He is well-developed.     Comments: Lying in bed; sitter present.   Cardiovascular:     Rate and Rhythm: Normal rate and regular rhythm.     Heart sounds: Normal heart sounds.  Pulmonary:     Effort: Pulmonary effort is normal.     Breath sounds: Normal breath sounds.  Skin:    General: Skin is warm and dry.  Neurological:     Mental Status: He is alert and oriented to person, place, and time.  Psychiatric:        Mood and Affect: Mood normal.     Lab Results Lab Results  Component Value Date   WBC 5.4 11/17/2019   HGB 11.0 (L) 11/17/2019   HCT 35.0 (L) 11/17/2019   MCV 84.1 11/17/2019   PLT 253 11/17/2019    Lab Results  Component Value Date   CREATININE 0.93 11/17/2019   BUN 23 (H) 11/17/2019   NA 136 11/17/2019   K 3.7 11/17/2019   CL 99 11/17/2019   CO2 24 11/17/2019    Lab Results  Component Value Date   ALT 47 (H) 11/17/2019   AST 33 11/17/2019   ALKPHOS 83 11/17/2019   BILITOT 0.5 11/17/2019     Microbiology: Recent Results (from the past 240 hour(s))  Respiratory Panel by RT PCR (Flu A&B, Covid) - Nasopharyngeal Swab     Status: None   Collection Time: 11/17/19 11:14 PM   Specimen: Nasopharyngeal Swab  Result  Value Ref Range Status   SARS Coronavirus 2 by RT PCR NEGATIVE NEGATIVE Final    Comment: (NOTE) SARS-CoV-2 target nucleic acids are NOT DETECTED.  The SARS-CoV-2 RNA is generally detectable in upper respiratoy specimens during the acute phase of infection. The lowest concentration of SARS-CoV-2 viral copies this assay can detect is 131 copies/mL. A negative result does not preclude SARS-Cov-2 infection and should not be used as the sole basis for treatment or other patient management decisions. A negative result may occur with  improper specimen collection/handling, submission of specimen other than nasopharyngeal swab, presence of viral mutation(s) within the areas targeted by this assay, and inadequate number of viral copies (<131 copies/mL). A negative result must be combined with clinical observations, patient history, and epidemiological information. The expected result  is Negative.  Fact Sheet for Patients:  PinkCheek.be  Fact Sheet for Healthcare Providers:  GravelBags.it  This test is no t yet approved or cleared by the [place] FDA and  has been authorized for detection and/or diagnosis of SARS-CoV-2 by FDA under an Emergency Use Authorization (EUA). This EUA will remain  in effect (meaning this test can be used) for the duration of the COVID-19 declaration under Section 564(b)(1) of the Act, 21 U.S.C. section 360bbb-3(b)(1), unless the authorization is terminated or revoked sooner.     Influenza A by PCR NEGATIVE NEGATIVE Final   Influenza B by PCR NEGATIVE NEGATIVE Final    Comment: (NOTE) The Xpert Xpress SARS-CoV-2/FLU/RSV assay is intended as an aid in  the diagnosis of influenza from Nasopharyngeal swab specimens and  should not be used as a sole basis for treatment. Nasal washings and  aspirates are unacceptable for Xpert Xpress SARS-CoV-2/FLU/RSV  testing.  Fact Sheet for  Patients: PinkCheek.be  Fact Sheet for Healthcare Providers: GravelBags.it  This test is not yet approved or cleared by the [place] FDA and  has been authorized for detection and/or diagnosis of SARS-CoV-2 by  FDA under an Emergency Use Authorization (EUA). This EUA will remain  in effect (meaning this test can be used) for the duration of the  Covid-19 declaration under Section 564(b)(1) of the Act, 21  U.S.C. section 360bbb-3(b)(1), unless the authorization is  terminated or revoked. Performed at [facility], [place] [address] [place], [place] [address]      Terri Piedra, [facility] Infectious [facility] [employer]  11/18/2019  3:00 PM

## 2019-11-18 NOTE — Plan of Care (Addendum)
  Problem: Health Behavior/Discharge Planning: Goal: Ability to manage health-related needs will improve Outcome: Progressing   Problem: Clinical Measurements: Goal: Ability to maintain clinical measurements within normal limits will improve Outcome: Progressing   Problem: Activity: Goal: Risk for activity intolerance will decrease Outcome: Progressing   Problem: Nutrition: Goal: Adequate nutrition will be maintained Outcome: Progressing   Problem: Coping: Goal: Level of anxiety will decrease Outcome: Progressing   Problem: Elimination: Goal: Will not experience complications related to urinary retention Outcome: Progressing   Problem: Pain Managment: Goal: General experience of comfort will improve Outcome: Progressing  Patient received to room 5N09 via stretcher from ED accompanied by sitter for safety.  Introduced to staff and to unit routine.  Explanation of plan of care as outlined by his primary doctor was discussed.  Questions answered to patient's satisfaction.  Also discussed policy re:  Not utilizing unprescribed substances.  Rationale for sitter explained.  Mood appears appropriate.  Call light within reach and bed in lowest position with wheels locked.

## 2019-11-18 NOTE — H&P (Signed)
History and Physical    Daniel Raymond IFO:[id number] DOB: 02/03/1985 DOA: 11/17/2019  PCP: Patient, No Pcp Per Consultants:  None Patient coming from:  Homeless; NOK: Mother, [phone]  Chief Complaint: neck pain  HPI: Daniel Raymond is a 34 y.o. male with medical history significant of IVDA; tobacco dependence; Hep C; asthma; and osteomyelitis of the cervical spine presenting with neck pain.  This appears to have been initially diagnosed in June at [facility], but he left AMA.  He was then admitted for [facility] from 9/18-26 for cervical osteo, treated with Vanc, Rocephin; TTE on 9/20 without obvious vegetations.  He left AMA on 9/26.  He presented to [facility] on 10/4 and again left AMA.   He was last admitted at [facility] from 10/18-26 with C-spine osteomyelitis, again treated with Vanc and Rocephin.  Hep C viral load was 173,000.  He was seen by psychiatry and started on Subutex, but during the hospitalization he was found to be using heroin and so this was discontinued.  He left AMA on 10/2.  Since he left the hospital, he hasn't finished his antibiotics and felt like he need to come back to finish them.  His neck is continuing to hurt.  He does want to stop using and is willing to do buprenorphine again.  He smoked marijuana yesterday.  He acknowledges using heroin in the hospital last week.    ED Course:  Carryover, per Dr. Marlowe Sax:  Patient with history of IV drug use and prior hospitalizations at [facility], and [facility] where he left AMA without finishing treatment for cervical septic arthritis. Now presenting with complaints of worsening neck pain. MRI showing C2-3 septic arthritis. Neurosurgery recommended medical management with IV antibiotic therapy for several weeks. Not a candidate for PICC given history of IV drug use. Patient was given ceftriaxone and vancomycin.   Review of Systems: As per HPI; otherwise review of systems reviewed and negative.   Ambulatory  Status:  Ambulates without assistance  COVID Vaccine Status:  None  Past Medical History:  Diagnosis Date  . Hepatitis C   . IV drug abuse ([place])   . Polysubstance dependence including opioid type drug with complication, episodic abuse, with unspecified complication ([place])   . Subacute osteomyelitis of cervical spine (HCC)   . Tobacco dependence     Past Surgical History:  Procedure Laterality Date  . ANKLE SURGERY      Social History   Socioeconomic History  . Marital status: Unknown    Spouse name: Not on file  . Number of children: Not on file  . Years of education: Not on file  . Highest education level: Not on file  Occupational History  . Occupation: unemployed  Tobacco Use  . Smoking status: Current Every Day Smoker    Packs/day: 1.00    Years: 21.00    Pack years: 21.00    Types: Cigarettes  . Smokeless tobacco: Never Used  Vaping Use  . Vaping Use: Never used  Substance and Sexual Activity  . Alcohol use: Not Currently  . Drug use: Yes    Types: Methamphetamines, Heroin, Marijuana  . Sexual activity: Not on file  Other Topics Concern  . Not on file  Social History Narrative  . Not on file   Social Determinants of Health   Financial Resource Strain:   . Difficulty of Paying Living Expenses: Not on file  Food Insecurity:   . Worried About Charity fundraiser in the Last Year: Not on  file  . [place] in the Last Year: Not on file  Transportation Needs:   . Lack of Transportation (Medical): Not on file  . Lack of Transportation (Non-Medical): Not on file  Physical Activity:   . Days of Exercise per Week: Not on file  . Minutes of Exercise per Session: Not on file  Stress:   . Feeling of Stress : Not on file  Social Connections:   . Frequency of Communication with Friends and Family: Not on file  . Frequency of Social Gatherings with Friends and Family: Not on file  . Attends Religious Services: Not on file  . Active Member of Clubs or  Organizations: Not on file  . Attends Archivist Meetings: Not on file  . Marital Status: Not on file  Intimate Partner Violence:   . Fear of Current or Ex-Partner: Not on file  . Emotionally Abused: Not on file  . Physically Abused: Not on file  . Sexually Abused: Not on file    Allergies  Allergen Reactions  . Ibuprofen Other (See Comments)    Stomach pains    History reviewed. No pertinent family history.  Prior to Admission medications   Not on File    Physical Exam: Vitals:   11/18/19 0200 11/18/19 0621 11/18/19 1100 11/18/19 1200  BP: 103/75 113/69 111/69 111/72  Pulse: (!) 101 70 98 77  Resp:  15    Temp:      TempSrc:      SpO2: 100% 98% 98% 100%  Weight:      Height:         . General:  Appears disheveled and somewhat withdrawn and is NAD . Eyes:  PERRL, EOMI, normal lids, iris . ENT:  grossly normal hearing, lips & tongue, mmm; poor dentition . Neck:  no LAD, masses or thyromegaly . Cardiovascular:  RRR, no m/r/g. No LE edema.  Marland Kitchen Respiratory:   CTA bilaterally with no wheezes/rales/rhonchi.  Normal respiratory effort. . Abdomen:  soft, NT, ND, NABS . Skin:  no rash or induration seen on limited exam . Musculoskeletal:  grossly normal tone BUE/BLE, good ROM, no bony abnormality . Psychiatric:  flat mood and affect, speech fluent and appropriate, AOx3 . Neurologic:  CN 2-12 grossly intact, moves all extremities in coordinated fashion    Radiological Exams on Admission: Independently reviewed - see discussion in A/P where applicable  MR Cervical Spine W or Wo Contrast  Result Date: 11/18/2019 CLINICAL DATA:  History of discitis-osteomyelitis.  Neck pain EXAM: MRI CERVICAL SPINE WITHOUT AND WITH CONTRAST TECHNIQUE: Multiplanar and multiecho pulse sequences of the cervical spine, to include the craniocervical junction and cervicothoracic junction, were obtained without and with intravenous contrast. CONTRAST:  7.57m GADAVIST GADOBUTROL 1 MMOL/ML  IV SOLN COMPARISON:  CT cervical spine 05/07/2019 FINDINGS: Alignment: Reversal of normal cervical lordosis Vertebrae: There is hyperintense T2 and hypointense T1-weighted signal at C6 and C7. There is also mild contrast enhancement at both locations. There is no abnormal signal within the discs. There is abnormal signal and contrast enhancement at the left C2-3 facet joint. Cord: Normal Posterior Fossa, vertebral arteries, paraspinal tissues: Negative. Disc levels: C1-2: Unremarkable. C2-3: Left uncovertebral hypertrophy. There is no spinal canal stenosis. Mild left neural foraminal stenosis. C3-4: Bilateral uncovertebral hypertrophy. There is no spinal canal stenosis. Mild bilateral neural foraminal stenosis. C4-5: Normal disc space and facet joints. There is no spinal canal stenosis. No neural foraminal stenosis. C5-6: Disc space narrowing with endplate ridging.  Mild spinal canal stenosis. Mild right neural foraminal stenosis. C6-7: Small disc bulge with left-greater-than-right uncovertebral hypertrophy. There is no spinal canal stenosis. Mild bilateral neural foraminal stenosis. C7-T1: Normal disc space and facet joints. There is no spinal canal stenosis. No neural foraminal stenosis. IMPRESSION: 1. There is abnormal signal and contrast enhancement at the left C2-3 facet joint, which is concerning for septic arthritis. 2. No evidence for active discitis-osteomyelitis. Signal changes at C6-7 could be degenerative or related to chronic osteomyelitis. 3. Mild spinal canal stenosis at C5-6. 4. Mild multilevel neural foraminal stenosis, including on the left at C2-3 and bilaterally at C3-4 and C6-7. Electronically Signed   By: Ulyses Jarred M.D.   On: 11/18/2019 03:38    EKG: not done   Labs on Admission: I have personally reviewed the available labs and imaging studies at the time of the admission.  Pertinent labs:   BUN 23/Creatinine 0.93/GFR >60 WBC 5.4 Hgb 11.0 CRP 0.9 ESR 27 COVID/flu  negative   Assessment/Plan Active Problems:   IV drug abuse (HCC)   Polysubstance dependence including opioid type drug with complication, episodic abuse, with unspecified complication (HCC)   Tobacco dependence   Hepatitis C   Subacute osteomyelitis of cervical spine (HCC)    C-spine osteomyelitis -Patient presenting with recurrent C-spine osteomyelitis; this has been ongoing since June but he has yet to remain hospitalized for the duration of therapy -Concerning lab findings include CRP >13.5, elevated ESR (goal is < 1/2 age +40 if male); ESR is mildly elevated but normal CRP (it was 32.3 on 10/25 at [facility]).  WBC is often <15; his is 5.4. -MRI confirms septic arthritis and prior MRI with osteo. -Neurosurgery was consulted but reports no need for intervention since he has no epidural involvement; further consultation reportedly not needed at this time. -Will need antibiotics for 4-6 weeks. -With vertebral osteo, needs testing for TB. AFB stain and culture were negative in 2020 at [facility].  Quantiferon gold ordered. -Further important considerations include nutrition, tobacco cessation - consults ordered. -Will request ID consultation. -Blood cultures were negative on last admission; will repeat. -Will treat with Rocephin and Vancomycin for now, as this is what ID at [facility] recommended. -Will request PICC line for now with locking mechanism if available.  It is essential that this be removed should the patient attempt to leave the hospital.  Hepatitis C -Viral load of 173,697 on 9/19 at [facility] -He has not received treatment for this issue due to ongoing IVDA -ID consult as above  Polysubstance abuse, including IVDA -Long-standing heroin dependence -Hhe has a pattern of leaving AMA and returning to the ER at a different hospital -He reports a desire to quit and recognizes that ongoing use is life-threatening -Will initiate suboxone therapy now; while this may precipitate early  withdrawal, hopefully he can reach a level of withdrawal symptom relief that will enable him to remain hospitalized and complete treatment -Will use the Suboxone therapy order set -If patient experiences precipitated withdrawal, will give small, frequent doses of buprenorphine (2 mg q 1-2h with adjunctive Clonidine 0.1 mg q8h, Zofran, and NSAIDs) until the precipitated withdrawal is overcome -If patient requires pain management in addition to home Suboxone therapy, use high-affinity full agonist opioids such as hydromorphone IV - not ordered at this time -Will monitor on COWS protocol (5-12 mild symptoms; 13-24 moderate symptoms; 25-36 moderately severe symptoms; >36 severe symptoms) -TOC team consult for substance abuse education/support -prn orders from the Clonidine withdrawal order set were also  ordered. -Based on (acknowledged) IVDA during prior admission, the "Illicit Drug Abuse" protocol was used.  The order set is not yet in publication but orders from this order set were used including:  -1:1 sitter  -Psychiatry consult  -Restricted visitors  -Security searched patient/belongings for contraband (none found)  -All non-adherent behavior should be documented as a safety zone  Tobacco dependence -Encourage cessation.   -This was discussed with the patient and should be reviewed on an ongoing basis.   -Patch ordered    Note: This patient has been tested and is negative for the novel coronavirus COVID-19. The patient not been vaccinated against COVID-19.    DVT prophylaxis:  Lovenox  Code Status:  Full  Family Communication: None present Disposition Plan:  The patient is from: homeless  Anticipated d/c is to: be determined; he is at high risk for leaving AMA, although this should be discouraged  Anticipated d/c date will depend on clinical response to treatment, but likely 4-6 weeks if he is willing to stay to complete treatment  Patient is currently: acutely ill Consults called:  ID; Neurosurgery (telephone only); TOC team Admission status:  Admit - It is my clinical opinion that admission to INPATIENT is reasonable and necessary because of the expectation that this patient will require hospital care that crosses at least 2 midnights to treat this condition based on the medical complexity of the problems presented.  Given the aforementioned information, the predictability of an adverse outcome is felt to be significant.    Karmen Bongo MD [facility]   How to contact the [facility] Attending or Consulting provider [place] or covering provider during after hours [place], for this patient?  1. Check the care team in [facility] and look for a) attending/consulting TR[facility] provider listed and b) the [facility] team listed 2. Log into [email or web address] and use [place]'s universal password to access. If you do not have the password, please contact the hospital operator. 3. Locate the [facility] provider you are looking for under [facility] and page to a number that you can be directly reached. 4. If you still have difficulty reaching the provider, please page the [facility] (Director on Call) for the Hospitalists listed on amion for assistance.   11/18/2019, 1:47 PM

## 2019-11-19 DIAGNOSIS — M4622 Osteomyelitis of vertebra, cervical region: Principal | ICD-10-CM

## 2019-11-19 LAB — BASIC METABOLIC PANEL
Anion gap: 8 (ref 5–15)
BUN: 19 mg/dL (ref 6–20)
CO2: 23 mmol/L (ref 22–32)
Calcium: 9.1 mg/dL (ref 8.9–10.3)
Chloride: 106 mmol/L (ref 98–111)
Creatinine, Ser: 0.79 mg/dL (ref 0.61–1.24)
GFR, Estimated: >60 mL/min (ref >60)
Glucose, Bld: 103 mg/dL — ABNORMAL HIGH (ref 70–99)
Potassium: 3.8 mmol/L (ref 3.5–5.1)
Sodium: 137 mmol/L (ref 135–145)

## 2019-11-19 LAB — CBC
HCT: 32.9 % — ABNORMAL LOW (ref 39.0–52.0)
Hemoglobin: 10.4 g/dL — ABNORMAL LOW (ref 13.0–17.0)
MCH: 26.6 pg (ref 26.0–34.0)
MCHC: 31.6 g/dL (ref 30.0–36.0)
MCV: 84.1 fL (ref 80.0–100.0)
Platelets: 226 10*3/uL (ref 150–400)
RBC: 3.91 MIL/uL — ABNORMAL LOW (ref 4.22–5.81)
RDW: 15.4 % (ref 11.5–15.5)
WBC: 4.8 10*3/uL (ref 4.0–10.5)
nRBC: 0 % (ref 0.0–0.2)

## 2019-11-19 MED ORDER — ADULT MULTIVITAMIN W/MINERALS CH
1.0000 | ORAL_TABLET | Freq: Every day | ORAL | Status: DC
  Administered 2019-11-20 – 2019-12-13 (×23): 1 via ORAL
  Filled 2019-11-19 (×24): qty 1

## 2019-11-19 MED ORDER — ENSURE ENLIVE PO LIQD: 237.0000 mL | Freq: Two times a day (BID) | ORAL | Status: DC

## 2019-11-19 MED ORDER — VANCOMYCIN HCL 1500 MG/300ML IV SOLN
1500.0000 mg | Freq: Two times a day (BID) | INTRAVENOUS | Status: DC
  Administered 2019-11-20 – 2019-12-10 (×41): 1500 mg via INTRAVENOUS
  Filled 2019-11-19 (×44): qty 300

## 2019-11-19 NOTE — Progress Notes (Signed)
Dr. [place] notified about pt refusing his clonidine both at 12:00 and 18:00. Pt states he does not like how the medication makes him feel, and that it makes him dizzy and lightheaded.

## 2019-11-19 NOTE — Progress Notes (Signed)
PROGRESS NOTE    Daniel Raymond  DPO:[id number] DOB: 1985/02/05 DOA: 11/17/2019 PCP: Patient, No Pcp Per    Brief Narrative:  Daniel Raymond is a 34 year old male with past medical history notable for IVDA, tobacco dependence, hepatitis C, asthma, chronic osteomyelitis of the cervical spine who presented to the ED with neck pain.  Osteomyelitis initially diagnosed June 2021 at [facility], but he left AMA.  Admitted at [facility] from 9/18-26 for cervical osteomyelitis, treated with vancomycin and Rocephin with TTE 09/2019 without obvious vegetations; left AMA on 9/26.  Presented to [facility] on 10/4 and again left AMA.  Last admitted at [facility] from 10/18-26 with C-spine osteomyelitis, again treated with vancomycin and Rocephin.  He was started on Subutex by psychiatry during hospitalization but was found to be using heroin so this was discontinued.  He left AMA on 10/23/2019.  Since leaving the hospital he has not finished his antibiotics and felt like he needed to come back to finish them.  He does report wanting to stop using illicit drugs and is willing to continue buprenorphine again.  He acknowledges using heroin in the hospital last week.  In the ED, MRI C-spine noted C2-C3 septic arthritis.  Neurosurgery recommended medical management with IV antibiotic therapy for several weeks.  Hospital service consulted for admission.   Assessment & Plan:   Principal Problem:   Subacute osteomyelitis of cervical spine (HCC) Active Problems:   IV drug abuse (HCC)   Polysubstance dependence including opioid type drug with complication, episodic abuse, with unspecified complication (HCC)   Tobacco dependence   Hepatitis C   Cervical spine osteomyelitis, septic arthritis Patient presenting following multiple recent hospitalizations after leaving AMA with continued neck pain.  MR C-spine with abnormal signal/contrast C2--3 facet joint consistent with  septic arthritis with no evidence of active discitis/osteomyelitis but with chronic osteomyelitis C6/7.  Patient is afebrile without leukocytosis.  ESR 27, CRP 0.9.  ED provider discussed case with neurosurgery PA, Kim who advised no intervention without epidural involvement and recommended medical management. --Infectious disease following, appreciate assistance --QuantiFERON gold: Pending --Blood cultures x2: Pending --Received oritavancin on 11/18/2019  --Continue empiric antibiotics with ceftriaxone and vancomycin --Await further ID recommendations  Hepatitis C Viral load 173,697 on 10/10/2019 at [facility].  Is treatment nave due to ongoing IV drug abuse.  Per ID will follow up outpatient.  Polysubstance abuse Patient with long standing history of heroin dependence, has history of leaving AMA and returning to different hospital ED's.  Recently started on Suboxone at [facility], but was discontinued as he was using heroin inpatient.  He currently desires to quit. --Suboxone 8-79m PO BID --Clonidine 0.1 mg q8h --Hydroxyzine 25 mg q6h prn anxiety --continue to montior on COWS protocol --1:1 sitter, restrictive is service, psychiatry consultation  Tobacco dependence Counseled on need for complete cessation. --Nicotine patch   DVT prophylaxis: Lovenox Code Status: Full code Family Communication: No family present at bedside  Disposition Plan:  Status is: Inpatient  Remains inpatient appropriate because:Ongoing active pain requiring inpatient pain management, Unsafe d/c plan, IV treatments appropriate due to intensity of illness or inability to take PO and Inpatient level of care appropriate due to severity of illness   Dispo: The patient is from: Home              Anticipated d/c is to: Home              Anticipated d/c date is: >  3 days              Patient currently is not medically stable to d/c.  Consultants:   Neurosurgery, EDP discussed with PA Kim via  telephone  Infectious disease  Psychiatry  Procedures:   None  Antimicrobials:   Oritavancin 10/28  Vancomycin 10/28>>  Ceftriaxone 10/28>>   Subjective: Patient seen and examined bedside, resting comfortably.  Sitter present.  Continues with some neck discomfort.  No other complaints or concerns at this time.  Denies headache, no visual changes, no chest pain, palpitations, no shortness of breath, no abdominal pain.  No acute events overnight per nursing staff.  Objective: Vitals:   11/18/19 1457 11/18/19 1507 11/18/19 2103 11/19/19 0510  BP:  115/76 103/66 112/71  Pulse:  72 65 (!) 59  Resp:  17 18 18   Temp: 98.1 F (36.7 C) 97.6 F (36.4 C) (!) 97.5 F (36.4 C) 98.3 F (36.8 C)  TempSrc: Oral Oral Oral Oral  SpO2:  99% 100% 100%  Weight:  72.6 kg    Height:  5' 10"  (1.778 m)      Intake/Output Summary (Last 24 hours) at 11/19/2019 1108 Last data filed at 11/19/2019 0400 Gross per 24 hour  Intake 2299.85 ml  Output 2 ml  Net 2297.85 ml   Filed Weights   11/17/19 1804 11/18/19 1507  Weight: 72.6 kg 72.6 kg    Examination:  General exam: Appears calm and comfortable, appears older than stated age, disheveled in appearance Respiratory system: Clear to auscultation. Respiratory effort normal.  Oxygen well on room air Cardiovascular system: S1 & S2 heard, RRR. No JVD, murmurs, rubs, gallops or clicks. No pedal edema. Gastrointestinal system: Abdomen is nondistended, soft and nontender. No organomegaly or masses felt. Normal bowel sounds heard. Central nervous system: Alert and oriented. No focal neurological deficits. Extremities: Symmetric 5 x 5 power. Skin: No rashes, lesions or ulcers Psychiatry: Judgement and insight appear normal. Mood & affect appropriate.     Data Reviewed: I have personally reviewed following labs and imaging studies  CBC: Recent Labs  Lab 11/17/19 1827 11/19/19 0435  WBC 5.4 4.8  NEUTROABS 2.6  --   HGB 11.0* 10.4*  HCT  35.0* 32.9*  MCV 84.1 84.1  PLT 253 938   Basic Metabolic Panel: Recent Labs  Lab 11/17/19 1827 11/19/19 0435  NA 136 137  K 3.7 3.8  CL 99 106  CO2 24 23  GLUCOSE 98 103*  BUN 23* 19  CREATININE 0.93 0.79  CALCIUM 9.6 9.1   GFR: Estimated Creatinine Clearance: 133.6 mL/min (by C-G formula based on SCr of 0.79 mg/dL). Liver Function Tests: Recent Labs  Lab 11/17/19 1827  AST 33  ALT 47*  ALKPHOS 83  BILITOT 0.5  PROT 8.1  ALBUMIN 4.1   No results for input(s): LIPASE, AMYLASE in the last 168 hours. No results for input(s): AMMONIA in the last 168 hours. Coagulation Profile: No results for input(s): INR, PROTIME in the last 168 hours. Cardiac Enzymes: No results for input(s): CKTOTAL, CKMB, CKMBINDEX, TROPONINI in the last 168 hours. BNP (last 3 results) No results for input(s): PROBNP in the last 8760 hours. HbA1C: No results for input(s): HGBA1C in the last 72 hours. CBG: No results for input(s): GLUCAP in the last 168 hours. Lipid Profile: No results for input(s): CHOL, HDL, LDLCALC, TRIG, CHOLHDL, LDLDIRECT in the last 72 hours. Thyroid Function Tests: No results for input(s): TSH, T4TOTAL, FREET4, T3FREE, THYROIDAB in the last 72 hours.  Anemia Panel: No results for input(s): VITAMINB12, FOLATE, FERRITIN, TIBC, IRON, RETICCTPCT in the last 72 hours. Sepsis Labs: No results for input(s): PROCALCITON, LATICACIDVEN in the last 168 hours.  Recent Results (from the past 240 hour(s))  Respiratory Panel by RT PCR (Flu A&B, Covid) - Nasopharyngeal Swab     Status: None   Collection Time: 11/17/19 11:14 PM   Specimen: Nasopharyngeal Swab  Result Value Ref Range Status   SARS Coronavirus 2 by RT PCR NEGATIVE NEGATIVE Final    Comment: (NOTE) SARS-CoV-2 target nucleic acids are NOT DETECTED.  The SARS-CoV-2 RNA is generally detectable in upper respiratoy specimens during the acute phase of infection. The lowest concentration of SARS-CoV-2 viral copies this assay  can detect is 131 copies/mL. A negative result does not preclude SARS-Cov-2 infection and should not be used as the sole basis for treatment or other patient management decisions. A negative result may occur with  improper specimen collection/handling, submission of specimen other than nasopharyngeal swab, presence of viral mutation(s) within the areas targeted by this assay, and inadequate number of viral copies (<131 copies/mL). A negative result must be combined with clinical observations, patient history, and epidemiological information. The expected result is Negative.  Fact Sheet for Patients:  PinkCheek.be  Fact Sheet for Healthcare Providers:  GravelBags.it  This test is no t yet approved or cleared by the [place] FDA and  has been authorized for detection and/or diagnosis of SARS-CoV-2 by FDA under an Emergency Use Authorization (EUA). This EUA will remain  in effect (meaning this test can be used) for the duration of the COVID-19 declaration under Section 564(b)(1) of the Act, 21 U.S.C. section 360bbb-3(b)(1), unless the authorization is terminated or revoked sooner.     Influenza A by PCR NEGATIVE NEGATIVE Final   Influenza B by PCR NEGATIVE NEGATIVE Final    Comment: (NOTE) The Xpert Xpress SARS-CoV-2/FLU/RSV assay is intended as an aid in  the diagnosis of influenza from Nasopharyngeal swab specimens and  should not be used as a sole basis for treatment. Nasal washings and  aspirates are unacceptable for Xpert Xpress SARS-CoV-2/FLU/RSV  testing.  Fact Sheet for Patients: PinkCheek.be  Fact Sheet for Healthcare Providers: GravelBags.it  This test is not yet approved or cleared by the [place] FDA and  has been authorized for detection and/or diagnosis of SARS-CoV-2 by  FDA under an Emergency Use Authorization (EUA). This EUA will remain   in effect (meaning this test can be used) for the duration of the  Covid-19 declaration under Section 564(b)(1) of the Act, 21  U.S.C. section 360bbb-3(b)(1), unless the authorization is  terminated or revoked. Performed at [facility], [place] [address] [place], [place] [address]          Radiology Studies: MR Cervical Spine W or Wo Contrast  Result Date: 11/18/2019 CLINICAL DATA:  History of discitis-osteomyelitis.  Neck pain EXAM: MRI CERVICAL SPINE WITHOUT AND WITH CONTRAST TECHNIQUE: Multiplanar and multiecho pulse sequences of the cervical spine, to include the craniocervical junction and cervicothoracic junction, were obtained without and with intravenous contrast. CONTRAST:  7.40m GADAVIST GADOBUTROL 1 MMOL/ML IV SOLN COMPARISON:  CT cervical spine 05/07/2019 FINDINGS: Alignment: Reversal of normal cervical lordosis Vertebrae: There is hyperintense T2 and hypointense T1-weighted signal at C6 and C7. There is also mild contrast enhancement at both locations. There is no abnormal signal within the discs. There is abnormal signal and contrast enhancement at the left C2-3 facet joint. Cord: Normal Posterior Fossa, vertebral  arteries, paraspinal tissues: Negative. Disc levels: C1-2: Unremarkable. C2-3: Left uncovertebral hypertrophy. There is no spinal canal stenosis. Mild left neural foraminal stenosis. C3-4: Bilateral uncovertebral hypertrophy. There is no spinal canal stenosis. Mild bilateral neural foraminal stenosis. C4-5: Normal disc space and facet joints. There is no spinal canal stenosis. No neural foraminal stenosis. C5-6: Disc space narrowing with endplate ridging. Mild spinal canal stenosis. Mild right neural foraminal stenosis. C6-7: Small disc bulge with left-greater-than-right uncovertebral hypertrophy. There is no spinal canal stenosis. Mild bilateral neural foraminal stenosis. C7-T1: Normal disc space and facet joints. There is no spinal canal stenosis. No neural foraminal  stenosis. IMPRESSION: 1. There is abnormal signal and contrast enhancement at the left C2-3 facet joint, which is concerning for septic arthritis. 2. No evidence for active discitis-osteomyelitis. Signal changes at C6-7 could be degenerative or related to chronic osteomyelitis. 3. Mild spinal canal stenosis at C5-6. 4. Mild multilevel neural foraminal stenosis, including on the left at C2-3 and bilaterally at C3-4 and C6-7. Electronically Signed   By: Ulyses Jarred M.D.   On: 11/18/2019 03:38   [place] EKG SITE RITE  Result Date: 11/18/2019 If Site Rite image not attached, placement could not be confirmed due to current cardiac rhythm.       Scheduled Meds: . buprenorphine-naloxone  1 tablet Sublingual BID  . cloNIDine  0.1 mg Oral QID   Followed by  . [START ON 11/20/2019] cloNIDine  0.1 mg Oral BH-qamhs   Followed by  . [START ON 11/22/2019] cloNIDine  0.1 mg Oral QAC breakfast  . enoxaparin (LOVENOX) injection  40 mg Subcutaneous Q24H  . influenza vac split quadrivalent PF  0.5 mL Intramuscular Tomorrow-1000   Continuous Infusions: . cefTRIAXone (ROCEPHIN)  IV 2 g (11/19/19 1030)  . vancomycin Stopped (11/19/19 0216)     LOS: 1 day    Time spent: 39 minutes spent on chart review, discussion with nursing staff, consultants, updating family and interview/physical exam; more than 50% of that time was spent in counseling and/or coordination of care.    Ivor Kishi J [place] DO [facility] Available via Epic secure chat 7am-7pm After these hours, please refer to coverage provider listed on [email or web address] 11/19/2019, 11:08 AM

## 2019-11-19 NOTE — Plan of Care (Signed)
  Problem: Pain Managment: Goal: General experience of comfort will improve Outcome: Progressing   

## 2019-11-19 NOTE — Progress Notes (Signed)
    [facility]   Reason for visit: Follow up on facet septic arthritis  Interval History: no positive culture findings  Physical Exam: Constitutional:  Vitals:   11/18/19 2103 11/19/19 0510  BP: 103/66 112/71  Pulse: 65 (!) 59  Resp: 18 18  Temp: (!) 97.5 F (36.4 C) 98.3 F (36.8 C)  SpO2: 100% 100%   patient appears in NAD  Impression/plan: Cervical facet septic arthritis.  Initial concerns for infection on CT at [facility] in June 2021.  Follow up in September at [facility] with C2-3 facet septic joint with bony erosions, osteo and myositis and again with facet septic arthritis here.  Poor compliance overall.  No positive cultures.  His ideal treatment is to continue with vancomycin and ceftriaxone inpatient for a prolonged period.   However, he likely will leave so has received oritavancin.  If he does leave, we can follow up with him as an outpatient and give additional doses as an outpatient.   We also can treat his hepatitis C with a positive RNA in September.   Dr. Daiva Eves available over the weekend if needed.

## 2019-11-19 NOTE — Progress Notes (Signed)
Initial Nutrition Assessment  DOCUMENTATION CODES:   Not applicable  INTERVENTION:   -Ensure Enlive po BID, each supplement provides 350 kcal and 20 grams of protein -MVI with minerals daily  NUTRITION DIAGNOSIS:   Increased nutrient needs related to acute illness (osteomyelitis of cervical spine) as evidenced by estimated needs.  GOAL:   Patient will meet greater than or equal to 90% of their needs  MONITOR:   PO intake, Supplement acceptance, Labs, Weight trends, Skin, I & O's  REASON FOR ASSESSMENT:   Consult Assessment of nutrition requirement/status  ASSESSMENT:   Daniel Raymond is a 34 y.o. male with medical history significant of IVDA; tobacco dependence; Hep C; asthma; and osteomyelitis of the cervical spine presenting with neck pain.  Pt admitted with cervical spine osteomyelitis and septic arthritis.  Reviewed I/O's: +2.3 L x 24 hours  Pt sleeping soundly at time of visit and did not arouse to touch of voice. Pt with blankets covering entirety of body.   Pt with good appetite, consuming 100% of meals.   Reviewed wt hx; wt has been stable over the past year.   Pt would benefit form addition of oral nutrition supplements due to increased nutritional needs for acute illness.   Labs reviewed.   Diet Order:   Diet Order            Diet regular Room service appropriate? Yes; Fluid consistency: Thin  Diet effective now                 EDUCATION NEEDS:   No education needs have been identified at this time  Skin:  Skin Assessment: Reviewed RN Assessment  Last BM:  11/10/19  Height:   Ht Readings from Last 1 Encounters:  11/18/19 5\' 10"  (1.778 m)    Weight:   Wt Readings from Last 1 Encounters:  11/18/19 72.6 kg    Ideal Body Weight:  75.5 kg  BMI:  Body mass index is 22.97 kg/m.  Estimated Nutritional Needs:   Kcal:  2000-2200  Protein:  95-110 grams  Fluid:  > 2 L    11/20/19, RD, LDN, CDCES Registered Dietitian  II Certified Diabetes Care and Education Specialist Please refer to [facility] for RD and/or RD on-call/weekend/after hours pager

## 2019-11-20 LAB — VANCOMYCIN, TROUGH: Vancomycin Tr: 15 ug/mL (ref 15–20)

## 2019-11-20 NOTE — Plan of Care (Signed)
°  Problem: Clinical Measurements: °Goal: Ability to maintain clinical measurements within normal limits will improve °Outcome: Progressing °  °Problem: Nutrition: °Goal: Adequate nutrition will be maintained °Outcome: Progressing °  °Problem: Coping: °Goal: Level of anxiety will decrease °Outcome: Progressing °  °Problem: Elimination: °Goal: Will not experience complications related to bowel motility °Outcome: Progressing °  °Problem: Pain Managment: °Goal: General experience of comfort will improve °Outcome: Progressing °  °Problem: Safety: °Goal: Ability to remain free from injury will improve °Outcome: Progressing °  °

## 2019-11-20 NOTE — Progress Notes (Signed)
PROGRESS NOTE    Elder Davidian  GEX:[id number] DOB: 20-Oct-1985 DOA: 11/17/2019 PCP: Patient, No Pcp Per    Brief Narrative:  Tyquez Hollibaugh is a 34 year old male with past medical history notable for IVDA, tobacco dependence, hepatitis C, asthma, chronic osteomyelitis of the cervical spine who presented to the ED with neck pain.  Osteomyelitis initially diagnosed June 2021 at [facility], but he left AMA.  Admitted at [facility] from 9/18-26 for cervical osteomyelitis, treated with vancomycin and Rocephin with TTE 09/2019 without obvious vegetations; left AMA on 9/26.  Presented to [facility] on 10/4 and again left AMA.  Last admitted at [facility] from 10/18-26 with C-spine osteomyelitis, again treated with vancomycin and Rocephin.  He was started on Subutex by psychiatry during hospitalization but was found to be using heroin so this was discontinued.  He left AMA on 10/23/2019.  Since leaving the hospital he has not finished his antibiotics and felt like he needed to come back to finish them.  He does report wanting to stop using illicit drugs and is willing to continue buprenorphine again.  He acknowledges using heroin in the hospital last week.  In the ED, MRI C-spine noted C2-C3 septic arthritis.  Neurosurgery recommended medical management with IV antibiotic therapy for several weeks.  Hospital service consulted for admission.   Assessment & Plan:   Principal Problem:   Subacute osteomyelitis of cervical spine (HCC) Active Problems:   IV drug abuse (HCC)   Polysubstance dependence including opioid type drug with complication, episodic abuse, with unspecified complication (HCC)   Tobacco dependence   Hepatitis C   Cervical spine osteomyelitis, septic arthritis Patient presenting following multiple recent hospitalizations after leaving AMA with continued neck pain.  MR C-spine with abnormal signal/contrast C2--3 facet joint consistent with  septic arthritis with no evidence of active discitis/osteomyelitis but with chronic osteomyelitis C6/7.  Patient is afebrile without leukocytosis.  ESR 27, CRP 0.9.  ED provider discussed case with neurosurgery PA, Kim who advised no intervention without epidural involvement and recommended medical management. --Infectious disease following, appreciate assistance --QuantiFERON gold: Pending --Blood cultures x2: no growth < 24h --Received oritavancin on 11/18/2019  --Continue empiric antibiotics with ceftriaxone and vancomycin --Await further ID recommendations  Hepatitis C Viral load 173,697 on 10/10/2019 at [facility].  Is treatment nave due to ongoing IV drug abuse.  Per ID will follow up outpatient.  Polysubstance abuse Patient with long standing history of heroin dependence, has history of leaving AMA and returning to different hospital ED's.  Recently started on Suboxone at [facility], but was discontinued as he was using heroin inpatient.  He currently desires to quit. --Suboxone 8-98m PO BID --Patient refusing clonidine, will discontinue --Hydroxyzine 25 mg q6h prn anxiety --continue to montior on COWS protocol --1:1 sitter, restrictive visitation  Tobacco dependence Counseled on need for complete cessation. --Nicotine patch   DVT prophylaxis: Lovenox Code Status: Full code Family Communication: No family present at bedside  Disposition Plan:  Status is: Inpatient  Remains inpatient appropriate because:Ongoing active pain requiring inpatient pain management, Unsafe d/c plan, IV treatments appropriate due to intensity of illness or inability to take PO and Inpatient level of care appropriate due to severity of illness   Dispo: The patient is from: Home              Anticipated d/c is to: Home              Anticipated d/c date  is: > 3 days              Patient currently is not medically stable to d/c.  Consultants:   Neurosurgery, EDP discussed with PA Kim  via telephone  Infectious disease  Psychiatry  Procedures:   None  Antimicrobials:   Oritavancin 10/28  Vancomycin 10/28>>  Ceftriaxone 10/28>>   Subjective: Patient seen and examined bedside, resting comfortably.  Continues with some neck pain.   No other complaints or concerns at this time.  Denies headache, no visual changes, no chest pain, palpitations, no shortness of breath, no abdominal pain.  No acute events overnight per nursing staff.  Objective: Vitals:   11/19/19 1514 11/19/19 2039 11/20/19 0336 11/20/19 0820  BP: 100/60 104/70 115/63 120/67  Pulse: 75 73 70 66  Resp: 18 19 17 18   Temp: 97.7 F (36.5 C) 98.6 F (37 C) (!) 97.5 F (36.4 C) 98.3 F (36.8 C)  TempSrc: Oral  Oral Oral  SpO2: 98% 100% 99% 100%  Weight:      Height:        Intake/Output Summary (Last 24 hours) at 11/20/2019 1136 Last data filed at 11/20/2019 0407 Gross per 24 hour  Intake 612.98 ml  Output --  Net 612.98 ml   Filed Weights   11/17/19 1804 11/18/19 1507  Weight: 72.6 kg 72.6 kg    Examination:  General exam: Appears calm and comfortable, appears older than stated age, disheveled in appearance Respiratory system: Clear to auscultation. Respiratory effort normal.  Oxygenating well on room air Cardiovascular system: S1 & S2 heard, RRR. No JVD, murmurs, rubs, gallops or clicks. No pedal edema. Gastrointestinal system: Abdomen is nondistended, soft and nontender. No organomegaly or masses felt. Normal bowel sounds heard. Central nervous system: Alert and oriented. No focal neurological deficits. Extremities: Symmetric 5 x 5 power. Skin: No rashes, lesions or ulcers Psychiatry: Judgement and insight appear normal. Mood & affect appropriate.     Data Reviewed: I have personally reviewed following labs and imaging studies  CBC: Recent Labs  Lab 11/17/19 1827 11/19/19 0435  WBC 5.4 4.8  NEUTROABS 2.6  --   HGB 11.0* 10.4*  HCT 35.0* 32.9*  MCV 84.1 84.1  PLT 253  917   Basic Metabolic Panel: Recent Labs  Lab 11/17/19 1827 11/19/19 0435  NA 136 137  K 3.7 3.8  CL 99 106  CO2 24 23  GLUCOSE 98 103*  BUN 23* 19  CREATININE 0.93 0.79  CALCIUM 9.6 9.1   GFR: Estimated Creatinine Clearance: 133.6 mL/min (by C-G formula based on SCr of 0.79 mg/dL). Liver Function Tests: Recent Labs  Lab 11/17/19 1827  AST 33  ALT 47*  ALKPHOS 83  BILITOT 0.5  PROT 8.1  ALBUMIN 4.1   No results for input(s): LIPASE, AMYLASE in the last 168 hours. No results for input(s): AMMONIA in the last 168 hours. Coagulation Profile: No results for input(s): INR, PROTIME in the last 168 hours. Cardiac Enzymes: No results for input(s): CKTOTAL, CKMB, CKMBINDEX, TROPONINI in the last 168 hours. BNP (last 3 results) No results for input(s): PROBNP in the last 8760 hours. HbA1C: No results for input(s): HGBA1C in the last 72 hours. CBG: No results for input(s): GLUCAP in the last 168 hours. Lipid Profile: No results for input(s): CHOL, HDL, LDLCALC, TRIG, CHOLHDL, LDLDIRECT in the last 72 hours. Thyroid Function Tests: No results for input(s): TSH, T4TOTAL, FREET4, T3FREE, THYROIDAB in the last 72 hours. Anemia Panel: No results for input(s):  VITAMINB12, FOLATE, FERRITIN, TIBC, IRON, RETICCTPCT in the last 72 hours. Sepsis Labs: No results for input(s): PROCALCITON, LATICACIDVEN in the last 168 hours.  Recent Results (from the past 240 hour(s))  Respiratory Panel by RT PCR (Flu A&B, Covid) - Nasopharyngeal Swab     Status: None   Collection Time: 11/17/19 11:14 PM   Specimen: Nasopharyngeal Swab  Result Value Ref Range Status   SARS Coronavirus 2 by RT PCR NEGATIVE NEGATIVE Final    Comment: (NOTE) SARS-CoV-2 target nucleic acids are NOT DETECTED.  The SARS-CoV-2 RNA is generally detectable in upper respiratoy specimens during the acute phase of infection. The lowest concentration of SARS-CoV-2 viral copies this assay can detect is 131 copies/mL. A  negative result does not preclude SARS-Cov-2 infection and should not be used as the sole basis for treatment or other patient management decisions. A negative result may occur with  improper specimen collection/handling, submission of specimen other than nasopharyngeal swab, presence of viral mutation(s) within the areas targeted by this assay, and inadequate number of viral copies (<131 copies/mL). A negative result must be combined with clinical observations, patient history, and epidemiological information. The expected result is Negative.  Fact Sheet for Patients:  PinkCheek.be  Fact Sheet for Healthcare Providers:  GravelBags.it  This test is no t yet approved or cleared by the [place] FDA and  has been authorized for detection and/or diagnosis of SARS-CoV-2 by FDA under an Emergency Use Authorization (EUA). This EUA will remain  in effect (meaning this test can be used) for the duration of the COVID-19 declaration under Section 564(b)(1) of the Act, 21 U.S.C. section 360bbb-3(b)(1), unless the authorization is terminated or revoked sooner.     Influenza A by PCR NEGATIVE NEGATIVE Final   Influenza B by PCR NEGATIVE NEGATIVE Final    Comment: (NOTE) The Xpert Xpress SARS-CoV-2/FLU/RSV assay is intended as an aid in  the diagnosis of influenza from Nasopharyngeal swab specimens and  should not be used as a sole basis for treatment. Nasal washings and  aspirates are unacceptable for Xpert Xpress SARS-CoV-2/FLU/RSV  testing.  Fact Sheet for Patients: PinkCheek.be  Fact Sheet for Healthcare Providers: GravelBags.it  This test is not yet approved or cleared by the [place] FDA and  has been authorized for detection and/or diagnosis of SARS-CoV-2 by  FDA under an Emergency Use Authorization (EUA). This EUA will remain  in effect (meaning this test can  be used) for the duration of the  Covid-19 declaration under Section 564(b)(1) of the Act, 21  U.S.C. section 360bbb-3(b)(1), unless the authorization is  terminated or revoked. Performed at [facility], [place] [address] [place], [place] [address]   Culture, blood (routine x 2)     Status: None (Preliminary result)   Collection Time: 11/18/19  7:49 PM   Specimen: BLOOD  Result Value Ref Range Status   Specimen Description BLOOD RIGHT ANTECUBITAL  Final   Special Requests   Final    BOTTLES DRAWN AEROBIC AND ANAEROBIC Blood Culture adequate volume   Culture   Final    NO GROWTH < 24 HOURS Performed at [facility], [place] [address] [place], [place] [address]    Report Status PENDING  Incomplete  Culture, blood (routine x 2)     Status: None (Preliminary result)   Collection Time: 11/19/19  4:35 AM   Specimen: BLOOD LEFT HAND  Result Value Ref Range Status   Specimen Description BLOOD LEFT HAND  Final  Special Requests   Final    BOTTLES DRAWN AEROBIC AND ANAEROBIC Blood Culture adequate volume   Culture   Final    NO GROWTH < 12 HOURS Performed at [facility], [place] [address] [place], [place] [address]    Report Status PENDING  Incomplete         Radiology Studies: [place] EKG SITE RITE  Result Date: 11/18/2019 If Site Rite image not attached, placement could not be confirmed due to current cardiac rhythm.       Scheduled Meds: . buprenorphine-naloxone  1 tablet Sublingual BID  . enoxaparin (LOVENOX) injection  40 mg Subcutaneous Q24H  . feeding supplement  237 mL Oral BID BM  . influenza vac split quadrivalent PF  0.5 mL Intramuscular Tomorrow-1000  . multivitamin with minerals  1 tablet Oral Daily   Continuous Infusions: . cefTRIAXone (ROCEPHIN)  IV 2 g (11/20/19 0215)  . vancomycin 1,500 mg (11/20/19 0257)     LOS: 2 days    Time spent: 36 minutes spent on chart review, discussion with nursing staff, consultants, updating family and  interview/physical exam; more than 50% of that time was spent in counseling and/or coordination of care.    Alynah Schone J [place] DO [facility] Available via Epic secure chat 7am-7pm After these hours, please refer to coverage provider listed on [email or web address] 11/20/2019, 11:36 AM

## 2019-11-20 NOTE — Progress Notes (Signed)
Pharmacy Antibiotic Note  Daniel Raymond is a 34 y.o. male admitted on 11/17/2019 with discitis, left outside hospital AMA.  Pharmacy has been consulted for vancomycin dosing.  BCx from [facility] 10/18 ngtd, therapeutic lvls on vancomycin 1500mg  IV every 12 hours with last dose charted 10/26 AM before pt left AMA.  Also was on ceftriaxone 2g q24h, per MD.    Vancomycin trough = 15, drawn late Pt also received a dose of oritavancin 10/28 per ID incase he leaves AMA again  Plan: Continue vancomycin 1500 mg IV every 12 hours Monitor renal function, ID recs and LOT Vancomycin trough as needed  Height: 5\' 10"  (177.8 cm) Weight: 72.6 kg (160 lb 0.9 oz) IBW/kg (Calculated) : 73  Temp (24hrs), Avg:98 F (36.7 C), Min:97.5 F (36.4 C), Max:98.6 F (37 C)  Recent Labs  Lab 11/17/19 1827 11/19/19 0435 11/20/19 1548  WBC 5.4 4.8  --   CREATININE 0.93 0.79  --   VANCOTROUGH  --   --  15    Estimated Creatinine Clearance: 133.6 mL/min (by C-G formula based on SCr of 0.79 mg/dL).    Allergies  Allergen Reactions  . Ibuprofen Other (See Comments)    Stomach pains    11/21/19, PharmD Clinical Pharmacist ED Pharmacist Phone # [phone] 11/20/2019 5:10 PM

## 2019-11-21 DIAGNOSIS — F1520 Other stimulant dependence, uncomplicated: Secondary | ICD-10-CM

## 2019-11-21 NOTE — Plan of Care (Signed)

## 2019-11-21 NOTE — Consult Note (Signed)
[facility] Face-to-Face Psychiatry Consult   Reason for Consult:  ''substance abuse, depression''  Referring Physician:  [place] Eric, DO Patient Identification: Daniel Raymond MRN:  [id number] Principal Diagnosis: Subacute osteomyelitis of cervical spine (HCC) Diagnosis:  Principal Problem:   Subacute osteomyelitis of cervical spine (HCC) Active Problems:   IV drug abuse (HCC)   Polysubstance dependence including opioid type drug with complication, episodic abuse, with unspecified complication (HCC)   Tobacco dependence   Hepatitis C   Methamphetamine dependence, continuous (HCC)   Total Time spent with patient: 1 hour  Subjective:   Daniel Raymond is a 34 y.o. male patient admitted with neck pain  HPI:  Patient is a 34 year old male with past medical history of Methamphetamine dependence, Opioid use use disorder-severe (IVDU) hepatitis C, asthma, chronic osteomyelitis of the cervical spine who was admitted to the hospital for neck pain and currently being treated for Osteomyelitis. Patient denies being depressed but admits to 7 years history of 5 days per week Methamphetamine use and 5-6 years history of Opioid dependence. He use methamphetamine the day of current admission but says he last use opioid about 2 weeks ago. He reports being currently homeless, use to own a home but sold it to by drugs. Today, he is cooperative, denies delusions, psychosis, anxiety, depression and self harming thoughts. However, he is requesting to be referral to drugs/alcohol rehabilitation facility after he is medically stable.  Past Psychiatric History: as above  Risk to Self:  denies Risk to Others:  denies Prior Inpatient Therapy:   Prior Outpatient Therapy:    Past Medical History:  Past Medical History:  Diagnosis Date  . Hepatitis C   . IV drug abuse (HCC)   . Polysubstance dependence including opioid type drug with complication, episodic abuse, with unspecified complication (HCC)   . Subacute  osteomyelitis of cervical spine (HCC)   . Tobacco dependence     Past Surgical History:  Procedure Laterality Date  . ANKLE SURGERY     Family History: History reviewed. No pertinent family history. Family Psychiatric  History: Social History:  Social History   Substance and Sexual Activity  Alcohol Use Not Currently     Social History   Substance and Sexual Activity  Drug Use Yes  . Types: Methamphetamines, Heroin, Marijuana    Social History   Socioeconomic History  . Marital status: Unknown    Spouse name: Not on file  . Number of children: Not on file  . Years of education: Not on file  . Highest education level: Not on file  Occupational History  . Occupation: unemployed  Tobacco Use  . Smoking status: Current Every Day Smoker    Packs/day: 1.00    Years: 21.00    Pack years: 21.00    Types: Cigarettes  . Smokeless tobacco: Never Used  Vaping Use  . Vaping Use: Never used  Substance and Sexual Activity  . Alcohol use: Not Currently  . Drug use: Yes    Types: Methamphetamines, Heroin, Marijuana  . Sexual activity: Not on file  Other Topics Concern  . Not on file  Social History Narrative  . Not on file   Social Determinants of Health   Financial Resource Strain:   . Difficulty of Paying Living Expenses: Not on file  Food Insecurity:   . Worried About Programme researcher, broadcasting/film/video in the Last Year: Not on file  . Ran Out of Food in the Last Year: Not on file  Transportation Needs:   . Lack  of Transportation (Medical): Not on file  . Lack of Transportation (Non-Medical): Not on file  Physical Activity:   . Days of Exercise per Week: Not on file  . Minutes of Exercise per Session: Not on file  Stress:   . Feeling of Stress : Not on file  Social Connections:   . Frequency of Communication with Friends and Family: Not on file  . Frequency of Social Gatherings with Friends and Family: Not on file  . Attends Religious Services: Not on file  . Active Member of  Clubs or Organizations: Not on file  . Attends Banker Meetings: Not on file  . Marital Status: Not on file   Additional Social History:    Allergies:   Allergies  Allergen Reactions  . Ibuprofen Other (See Comments)    Stomach pains    Labs:  Results for orders placed or performed during the hospital encounter of 11/17/19 (from the past 48 hour(s))  Vancomycin, trough     Status: None   Collection Time: 11/20/19  3:48 PM  Result Value Ref Range   Vancomycin Tr 15 15 - 20 ug/mL    Comment: Performed at [facility], [address], [place], [place] [address]    Current Facility-Administered Medications  Medication Dose Route Frequency Provider Last Rate Last Admin  . acetaminophen (TYLENOL) tablet 650 mg  650 mg Oral Q6H PRN Jonah Blue, MD       Or  . acetaminophen (TYLENOL) suppository 650 mg  650 mg Rectal Q6H PRN Jonah Blue, MD      . albuterol (PROVENTIL) (2.5 MG/3ML) 0.083% nebulizer solution 2.5 mg  2.5 mg Nebulization Q2H PRN Jonah Blue, MD      . buprenorphine-naloxone (SUBOXONE) 8-2 mg per SL tablet 1 tablet  1 tablet Sublingual BID Jonah Blue, MD   1 tablet at 11/21/19 0841  . cefTRIAXone (ROCEPHIN) 2 g in sodium chloride 0.9 % 100 mL IVPB  2 g Intravenous Q12H Jonah Blue, MD 200 mL/hr at 11/21/19 0205 2 g at 11/21/19 0205  . dicyclomine (BENTYL) tablet 20 mg  20 mg Oral Q6H PRN Jonah Blue, MD      . enoxaparin (LOVENOX) injection 40 mg  40 mg Subcutaneous Q24H Jonah Blue, MD   40 mg at 11/21/19 0841  . feeding supplement (ENSURE ENLIVE / ENSURE PLUS) liquid 237 mL  237 mL Oral BID BM [place], Eric J, DO   237 mL at 11/21/19 0843  . hydrALAZINE (APRESOLINE) injection 5 mg  5 mg Intravenous Q4H PRN Jonah Blue, MD      . hydrOXYzine (ATARAX/VISTARIL) tablet 25 mg  25 mg Oral Q6H PRN Jonah Blue, MD      . influenza vac split quadrivalent PF (FLUARIX) injection 0.5 mL  0.5 mL Intramuscular Tomorrow-1000 Jonah Blue, MD      . loperamide (IMODIUM) capsule 2-4 mg  2-4 mg Oral PRN Jonah Blue, MD      . methocarbamol (ROBAXIN) tablet 500 mg  500 mg Oral Q8H PRN Jonah Blue, MD   500 mg at 11/18/19 1800  . multivitamin with minerals tablet 1 tablet  1 tablet Oral Daily [place], Eric J, DO   1 tablet at 11/21/19 9024  . nicotine (NICODERM CQ - dosed in mg/24 hours) patch 14 mg  14 mg Transdermal Daily PRN Jonah Blue, MD      . ondansetron [facility]) tablet 4 mg  4 mg Oral Q6H PRN Jonah Blue, MD  Or  . ondansetron (ZOFRAN) injection 4 mg  4 mg Intravenous Q6H PRN Jonah Blue, MD      . vancomycin (VANCOREADY) IVPB 1500 mg/300 mL  1,500 mg Intravenous Q12H [place], Eric J, DO   Stopped at 11/21/19 6948    Musculoskeletal: Strength & Muscle Tone: not tested Gait & Station: not tested Patient leans: N/A  Psychiatric Specialty Exam: Physical Exam Psychiatric:        Attention and Perception: Attention and perception normal.        Mood and Affect: Mood normal. Affect is flat.        Speech: Speech normal.        Behavior: Behavior normal. Behavior is cooperative.        Thought Content: Thought content normal.        Cognition and Memory: Cognition and memory normal.        Judgment: Judgment is impulsive.     Review of Systems  Constitutional: Positive for fatigue.  HENT: Negative.   Eyes: Negative.   Respiratory: Negative.   Gastrointestinal: Positive for nausea.  Musculoskeletal: Positive for myalgias.  Psychiatric/Behavioral: Negative.     Blood pressure 118/77, pulse 67, temperature 98 F (36.7 C), temperature source Oral, resp. rate 18, height 5\' 10"  (1.778 m), weight 72.6 kg, SpO2 100 %.Body mass index is 22.97 kg/m.  General Appearance: Casual  Eye Contact:  Good  Speech:  Clear and Coherent  Volume:  Decreased  Mood:  Euthymic  Affect:  Constricted  Thought Process:  Coherent and Linear  Orientation:  Full (Time, Place, and Person)  Thought  Content:  Logical  Suicidal Thoughts:  No  Homicidal Thoughts:  No  Memory:  Immediate;   Good Recent;   Good Remote;   Good  Judgement:  Fair  Insight:  Shallow  Psychomotor Activity:  Psychomotor Retardation  Concentration:  Concentration: Fair and Attention Span: Fair  Recall:  Good  Fund of Knowledge:  Good  Language:  Good  Akathisia:  No  Handed:  Right  AIMS (if indicated):     Assets:  Communication Skills Desire for Improvement  ADL's:  Intact  Cognition:  WNL  Sleep:        Treatment Plan Summary: 34 year old male with long history of Methamphetamine and Opioid dependence who was admitted to the hospital for the treatment of C-spine Osteomyelitis. However, patient is requesting to be referred to Drug rehabilitation facility after he is medically stable.  Recommendation: -Consider Social worker referral to facilitate patient's referral to Drug rehabilitation program.  Disposition: No evidence of imminent risk to self or others at present.   Patient does not meet criteria for psychiatric inpatient admission. Supportive therapy provided about ongoing stressors. Psychiatric service siging out. Re-consult as needed  20, MD 11/21/2019 11:54 AM

## 2019-11-21 NOTE — Progress Notes (Signed)
PROGRESS NOTE    Daniel Raymond  VFI:[id number] DOB: 1985/08/03 DOA: 11/17/2019 PCP: Patient, No Pcp Per    Brief Narrative:  Daniel Raymond is a 34 year old male with past medical history notable for IVDA, tobacco dependence, hepatitis C, asthma, chronic osteomyelitis of the cervical spine who presented to the ED with neck pain.  Osteomyelitis initially diagnosed June 2021 at [facility], but he left AMA.  Admitted at [facility] from 9/18-26 for cervical osteomyelitis, treated with vancomycin and Rocephin with TTE 09/2019 without obvious vegetations; left AMA on 9/26.  Presented to [facility] on 10/4 and again left AMA.  Last admitted at [facility] from 10/18-26 with C-spine osteomyelitis, again treated with vancomycin and Rocephin.  He was started on Subutex by psychiatry during hospitalization but was found to be using heroin so this was discontinued.  He left AMA on 10/23/2019.  Since leaving the hospital he has not finished his antibiotics and felt like he needed to come back to finish them.  He does report wanting to stop using illicit drugs and is willing to continue buprenorphine again.  He acknowledges using heroin in the hospital last week.  In the ED, MRI C-spine noted C2-C3 septic arthritis.  Neurosurgery recommended medical management with IV antibiotic therapy for several weeks.  Hospital service consulted for admission.   Assessment & Plan:   Principal Problem:   Subacute osteomyelitis of cervical spine (HCC) Active Problems:   IV drug abuse (HCC)   Polysubstance dependence including opioid type drug with complication, episodic abuse, with unspecified complication (HCC)   Tobacco dependence   Hepatitis C   Cervical spine osteomyelitis, septic arthritis Patient presenting following multiple recent hospitalizations after leaving AMA with continued neck pain.  MR C-spine with abnormal signal/contrast C2--3 facet joint consistent with  septic arthritis with no evidence of active discitis/osteomyelitis but with chronic osteomyelitis C6/7.  Patient is afebrile without leukocytosis.  ESR 27, CRP 0.9.  ED provider discussed case with neurosurgery PA, Daniel Raymond who advised no intervention without epidural involvement and recommended medical management. --Infectious disease following, appreciate assistance --QuantiFERON gold: Pending --Blood cultures x2: no growth x 2 days --Received oritavancin on 11/18/2019  --Continue empiric antibiotics with ceftriaxone and vancomycin --Await further ID recommendations  Hepatitis C Viral load 173,697 on 10/10/2019 at [facility].  Is treatment nave due to ongoing IV drug abuse.  Per ID will follow up outpatient.  Polysubstance abuse Patient with long standing history of heroin dependence, has history of leaving AMA and returning to different hospital ED's.  Recently started on Suboxone at [facility], but was discontinued as he was using heroin inpatient.  He currently desires to quit. --Suboxone 8-2mg  PO BID --Hydroxyzine 25 mg q6h prn anxiety --continue to montior on COWS protocol --restrictive visitation  Tobacco dependence Counseled on need for complete cessation. --Nicotine patch   DVT prophylaxis: Lovenox Code Status: Full code Family Communication: No family present at bedside  Disposition Plan:  Status is: Inpatient  Remains inpatient appropriate because:Ongoing active pain requiring inpatient pain management, Unsafe d/c plan, IV treatments appropriate due to intensity of illness or inability to take PO and Inpatient level of care appropriate due to severity of illness   Dispo: The patient is from: Home              Anticipated d/c is to: Home              Anticipated d/c date is: > 3 days  Patient currently is not medically stable to d/c.  Consultants:   Neurosurgery, EDP discussed with PA Daniel Raymond via telephone  Infectious  disease  Psychiatry  Procedures:   None  Antimicrobials:   Oritavancin 10/28  Vancomycin 10/28>>  Ceftriaxone 10/28>>   Subjective: Patient seen and examined bedside, resting comfortably.  In good spirits this morning.  Neck pain tolerable.  Endorses commitment to stop abusing illicit drugs and requesting assistance for rehabilitation post hospitalization. No other complaints or concerns at this time.  Denies headache, no visual changes, no chest pain, palpitations, no shortness of breath, no abdominal pain.  No acute events overnight per nursing staff.  Objective: Vitals:   11/20/19 1417 11/20/19 1920 11/21/19 0549 11/21/19 0810  BP: 114/73 120/79 108/70 118/77  Pulse: 82 83 67 67  Resp: _0 Temp: (!) 97.5 F (36.4 C) 97.7 F (36.5 C) 98.4 F (36.9 C) 98 F (36.7 C)  TempSrc: Oral Oral Oral Oral  SpO2: 100% 100% 99% 100%  Weight:      Height:        Intake/Output Summary (Last 24 hours) at 11/21/2019 1100 Last data filed at 11/21/2019 1000 Gross per 24 hour  Intake 1526.89 ml  Output --  Net 1526.89 ml   Filed Weights   11/17/19 1804 11/18/19 1507  Weight: 72.6 kg 72.6 kg    Examination:  General exam: Appears calm and comfortable, appears older than stated age Respiratory system: Clear to auscultation. Respiratory effort normal.  Oxygenating well on room air Cardiovascular system: S1 & S2 heard, RRR. No JVD, murmurs, rubs, gallops or clicks. No pedal edema. Gastrointestinal system: Abdomen is nondistended, soft and nontender. No organomegaly or masses felt. Normal bowel sounds heard. Central nervous system: Alert and oriented. No focal neurological deficits. Extremities: Symmetric 5 x 5 power. Skin: No rashes, lesions or ulcers Psychiatry: Judgement and insight appear normal. Mood & affect appropriate.     Data Reviewed: I have personally reviewed following labs and imaging studies  CBC: Recent Labs  Lab 11/17/19 1827 11/19/19 0435  WBC  5.4 4.8  NEUTROABS 2.6  --   HGB 11.0* 10.4*  HCT 35.0* 32.9*  MCV 84.1 84.1  PLT 253 951   Basic Metabolic Panel: Recent Labs  Lab 11/17/19 1827 11/19/19 0435  NA 136 137  K 3.7 3.8  CL 99 106  CO2 24 23  GLUCOSE 98 103*  BUN 23* 19  CREATININE 0.93 0.79  CALCIUM 9.6 9.1   GFR: Estimated Creatinine Clearance: 133.6 mL/min (by C-G formula based on SCr of 0.79 mg/dL). Liver Function Tests: Recent Labs  Lab 11/17/19 1827  AST 33  ALT 47*  ALKPHOS 83  BILITOT 0.5  PROT 8.1  ALBUMIN 4.1   No results for input(s): LIPASE, AMYLASE in the last 168 hours. No results for input(s): AMMONIA in the last 168 hours. Coagulation Profile: No results for input(s): INR, PROTIME in the last 168 hours. Cardiac Enzymes: No results for input(s): CKTOTAL, CKMB, CKMBINDEX, TROPONINI in the last 168 hours. BNP (last 3 results) No results for input(s): PROBNP in the last 8760 hours. HbA1C: No results for input(s): HGBA1C in the last 72 hours. CBG: No results for input(s): GLUCAP in the last 168 hours. Lipid Profile: No results for input(s): CHOL, HDL, LDLCALC, TRIG, CHOLHDL, LDLDIRECT in the last 72 hours. Thyroid Function Tests: No results for input(s): TSH, T4TOTAL, FREET4, T3FREE, THYROIDAB in the last 72 hours. Anemia Panel: No results for input(s): VITAMINB12, FOLATE, FERRITIN,  TIBC, IRON, RETICCTPCT in the last 72 hours. Sepsis Labs: No results for input(s): PROCALCITON, LATICACIDVEN in the last 168 hours.  Recent Results (from the past 240 hour(s))  Respiratory Panel by RT PCR (Flu A&B, Covid) - Nasopharyngeal Swab     Status: None   Collection Time: 11/17/19 11:14 PM   Specimen: Nasopharyngeal Swab  Result Value Ref Range Status   SARS Coronavirus 2 by RT PCR NEGATIVE NEGATIVE Final    Comment: (NOTE) SARS-CoV-2 target nucleic acids are NOT DETECTED.  The SARS-CoV-2 RNA is generally detectable in upper respiratoy specimens during the acute phase of infection. The  lowest concentration of SARS-CoV-2 viral copies this assay can detect is 131 copies/mL. A negative result does not preclude SARS-Cov-2 infection and should not be used as the sole basis for treatment or other patient management decisions. A negative result may occur with  improper specimen collection/handling, submission of specimen other than nasopharyngeal swab, presence of viral mutation(s) within the areas targeted by this assay, and inadequate number of viral copies (<131 copies/mL). A negative result must be combined with clinical observations, patient history, and epidemiological information. The expected result is Negative.  Fact Sheet for Patients:  PinkCheek.be  Fact Sheet for Healthcare Providers:  GravelBags.it  This test is no t yet approved or cleared by the [place] FDA and  has been authorized for detection and/or diagnosis of SARS-CoV-2 by FDA under an Emergency Use Authorization (EUA). This EUA will remain  in effect (meaning this test can be used) for the duration of the COVID-19 declaration under Section 564(b)(1) of the Act, 21 U.S.C. section 360bbb-3(b)(1), unless the authorization is terminated or revoked sooner.     Influenza A by PCR NEGATIVE NEGATIVE Final   Influenza B by PCR NEGATIVE NEGATIVE Final    Comment: (NOTE) The Xpert Xpress SARS-CoV-2/FLU/RSV assay is intended as an aid in  the diagnosis of influenza from Nasopharyngeal swab specimens and  should not be used as a sole basis for treatment. Nasal washings and  aspirates are unacceptable for Xpert Xpress SARS-CoV-2/FLU/RSV  testing.  Fact Sheet for Patients: PinkCheek.be  Fact Sheet for Healthcare Providers: GravelBags.it  This test is not yet approved or cleared by the [place] FDA and  has been authorized for detection and/or diagnosis of SARS-CoV-2 by  FDA under  an Emergency Use Authorization (EUA). This EUA will remain  in effect (meaning this test can be used) for the duration of the  Covid-19 declaration under Section 564(b)(1) of the Act, 21  U.S.C. section 360bbb-3(b)(1), unless the authorization is  terminated or revoked. Performed at [facility], [place] [address] [place], [place] [address]   Culture, blood (routine x 2)     Status: None (Preliminary result)   Collection Time: 11/18/19  7:49 PM   Specimen: BLOOD  Result Value Ref Range Status   Specimen Description BLOOD RIGHT ANTECUBITAL  Final   Special Requests   Final    BOTTLES DRAWN AEROBIC AND ANAEROBIC Blood Culture adequate volume   Culture   Final    NO GROWTH 3 DAYS Performed at [facility], [place] [address] [place], [place] [address]    Report Status PENDING  Incomplete  Culture, blood (routine x 2)     Status: None (Preliminary result)   Collection Time: 11/19/19  4:35 AM   Specimen: BLOOD LEFT HAND  Result Value Ref Range Status   Specimen Description BLOOD LEFT HAND  Final   Special Requests  Final    BOTTLES DRAWN AEROBIC AND ANAEROBIC Blood Culture adequate volume   Culture   Final    NO GROWTH 2 DAYS Performed at [facility], [place] [address], [place], [place] [address]    Report Status PENDING  Incomplete         Radiology Studies: No results found.      Scheduled Meds: . buprenorphine-naloxone  1 tablet Sublingual BID  . enoxaparin (LOVENOX) injection  40 mg Subcutaneous Q24H  . feeding supplement  237 mL Oral BID BM  . influenza vac split quadrivalent PF  0.5 mL Intramuscular Tomorrow-1000  . multivitamin with minerals  1 tablet Oral Daily   Continuous Infusions: . cefTRIAXone (ROCEPHIN)  IV 2 g (11/21/19 0205)  . vancomycin Stopped (11/21/19 0457)     LOS: 3 days    Time spent: 35 minutes spent on chart review, discussion with nursing staff, consultants, updating family and interview/physical exam; more than 50% of that  time was spent in counseling and/or coordination of care.    Roosevelt Bisher J [place] DO [facility] Available via Epic secure chat 7am-7pm After these hours, please refer to coverage provider listed on [email or web address] 11/21/2019, 11:00 AM

## 2019-11-22 LAB — CBC
HCT: 32.6 % — ABNORMAL LOW (ref 39.0–52.0)
Hemoglobin: 10.2 g/dL — ABNORMAL LOW (ref 13.0–17.0)
MCH: 26.6 pg (ref 26.0–34.0)
MCHC: 31.3 g/dL (ref 30.0–36.0)
MCV: 85.1 fL (ref 80.0–100.0)
Platelets: 252 10*3/uL (ref 150–400)
RBC: 3.83 MIL/uL — ABNORMAL LOW (ref 4.22–5.81)
RDW: 15.4 % (ref 11.5–15.5)
WBC: 5 10*3/uL (ref 4.0–10.5)
nRBC: 0 % (ref 0.0–0.2)

## 2019-11-22 LAB — COMPREHENSIVE METABOLIC PANEL
ALT: 44 U/L (ref 0–44)
AST: 29 U/L (ref 15–41)
Albumin: 3.5 g/dL (ref 3.5–5.0)
Alkaline Phosphatase: 70 U/L (ref 38–126)
Anion gap: 9 (ref 5–15)
BUN: 19 mg/dL (ref 6–20)
CO2: 24 mmol/L (ref 22–32)
Calcium: 9.2 mg/dL (ref 8.9–10.3)
Chloride: 102 mmol/L (ref 98–111)
Creatinine, Ser: 0.88 mg/dL (ref 0.61–1.24)
GFR, Estimated: >60 mL/min (ref >60)
Glucose, Bld: 104 mg/dL — ABNORMAL HIGH (ref 70–99)
Potassium: 3.8 mmol/L (ref 3.5–5.1)
Sodium: 135 mmol/L (ref 135–145)
Total Bilirubin: 0.5 mg/dL (ref 0.3–1.2)
Total Protein: 7 g/dL (ref 6.5–8.1)

## 2019-11-22 LAB — C-REACTIVE PROTEIN: CRP: 0.9 mg/dL (ref <1.0)

## 2019-11-22 MED ORDER — SODIUM CHLORIDE 0.9% FLUSH
10.0000 mL | Freq: Two times a day (BID) | INTRAVENOUS | Status: DC
  Administered 2019-11-22 – 2019-11-27 (×6): 10 mL
  Administered 2019-11-28: 3 mL
  Administered 2019-11-30 – 2019-12-12 (×14): 10 mL

## 2019-11-22 MED ORDER — SODIUM CHLORIDE 0.9% FLUSH: 10.0000 mL | INTRAVENOUS | Status: DC | PRN

## 2019-11-22 MED ORDER — SODIUM CHLORIDE 0.9 % IV SOLN
2.0000 g | INTRAVENOUS | Status: DC
  Administered 2019-11-23 – 2019-12-13 (×20): 2 g via INTRAVENOUS
  Filled 2019-11-22 (×22): qty 20

## 2019-11-22 NOTE — Plan of Care (Signed)

## 2019-11-22 NOTE — Progress Notes (Signed)
PROGRESS NOTE    Lynn Sissel  ZOX:[id number] DOB: 07/13/1985 DOA: 11/17/2019 PCP: Patient, No Pcp Per    Brief Narrative:  Arpan Eskelson is a 34 year old male with past medical history notable for IVDA, tobacco dependence, hepatitis C, asthma, chronic osteomyelitis of the cervical spine who presented to the ED with neck pain.  Osteomyelitis initially diagnosed June 2021 at [facility], but he left AMA.  Admitted at [facility] from 9/18-26 for cervical osteomyelitis, treated with vancomycin and Rocephin with TTE 09/2019 without obvious vegetations; left AMA on 9/26.  Presented to [facility] on 10/4 and again left AMA.  Last admitted at [facility] from 10/18-26 with C-spine osteomyelitis, again treated with vancomycin and Rocephin.  He was started on Subutex by psychiatry during hospitalization but was found to be using heroin so this was discontinued.  He left AMA on 10/23/2019.  Since leaving the hospital he has not finished his antibiotics and felt like he needed to come back to finish them.  He does report wanting to stop using illicit drugs and is willing to continue buprenorphine again.  He acknowledges using heroin in the hospital last week.  In the ED, MRI C-spine noted C2-C3 septic arthritis.  Neurosurgery recommended medical management with IV antibiotic therapy for several weeks.  Hospital service consulted for admission.   Assessment & Plan:   Principal Problem:   Subacute osteomyelitis of cervical spine (HCC) Active Problems:   IV drug abuse (HCC)   Polysubstance dependence including opioid type drug with complication, episodic abuse, with unspecified complication (HCC)   Tobacco dependence   Hepatitis C   Methamphetamine dependence, continuous (HCC)   Cervical spine osteomyelitis, septic arthritis Patient presenting following multiple recent hospitalizations after leaving AMA with continued neck pain.  MR C-spine with abnormal  signal/contrast C2--3 facet joint consistent with septic arthritis with no evidence of active discitis/osteomyelitis but with chronic osteomyelitis C6/7.  Patient is afebrile without leukocytosis.  ESR 27, CRP 0.9.  ED provider discussed case with neurosurgery PA, Kim who advised no intervention without epidural involvement and recommended medical management. --Infectious disease following, appreciate assistance --QuantiFERON gold: Pending --Blood cultures x2: no growth x 4 days --Received oritavancin on 11/18/2019  --Continue empiric antibiotics with ceftriaxone and vancomycin --Await further ID recommendations  Hepatitis C Viral load 173,697 on 10/10/2019 at [facility].  Is treatment nave due to ongoing IV drug abuse.  Per ID will follow up outpatient.  Polysubstance abuse Patient with long standing history of heroin dependence, has history of leaving AMA and returning to different hospital ED's.  Recently started on Suboxone at [facility], but was discontinued as he was using heroin inpatient.  He currently desires to quit. --Suboxone 8-37m PO BID --Hydroxyzine 25 mg q6h prn anxiety --continue to montior on COWS protocol --restrictive visitation --Patient wishes for drug rehabilitation on discharge, social work for assistance  Tobacco dependence Counseled on need for complete cessation. --Nicotine patch   DVT prophylaxis: Lovenox Code Status: Full code Family Communication: No family present at bedside  Disposition Plan:  Status is: Inpatient  Remains inpatient appropriate because:Ongoing active pain requiring inpatient pain management, Unsafe d/c plan, IV treatments appropriate due to intensity of illness or inability to take PO and Inpatient level of care appropriate due to severity of illness   Dispo: The patient is from: Home              Anticipated d/c is to: Home  Anticipated d/c date is: > 3 days              Patient currently is not medically  stable to d/c.  Consultants:   Neurosurgery, EDP discussed with PA Kim via telephone  Infectious disease  Psychiatry  Procedures:   None  Antimicrobials:   Oritavancin 10/28  Vancomycin 10/28>>  Ceftriaxone 10/28>>   Subjective: Patient seen and examined bedside, resting comfortably.  Patient realizes that he needs to change his life and endorses commitment to stop abusing illicit drugs. Requests assistance for rehabilitation post hospitalization. No other complaints or concerns at this time.  Denies headache, no visual changes, no chest pain, palpitations, no shortness of breath, no abdominal pain.  No acute events overnight per nursing staff.  Objective: Vitals:   11/21/19 1559 11/21/19 1925 11/22/19 0539 11/22/19 0756  BP: 108/69 127/79 108/72 124/88  Pulse: 79 82 76 91  Resp: 17 16 16 15   Temp: 98.6 F (37 C) 98.4 F (36.9 C) 98.2 F (36.8 C) 98.3 F (36.8 C)  TempSrc: Oral Oral Oral Oral  SpO2: 100% 96% 99% 100%  Weight:      Height:        Intake/Output Summary (Last 24 hours) at 11/22/2019 1027 Last data filed at 11/22/2019 0900 Gross per 24 hour  Intake 1605.54 ml  Output --  Net 1605.54 ml   Filed Weights   11/17/19 1804 11/18/19 1507  Weight: 72.6 kg 72.6 kg    Examination:  General exam: Appears calm and comfortable, appears older than stated age Respiratory system: Clear to auscultation. Respiratory effort normal.  Oxygenating well on room air Cardiovascular system: S1 & S2 heard, RRR. No JVD, murmurs, rubs, gallops or clicks. No pedal edema. Gastrointestinal system: Abdomen is nondistended, soft and nontender. No organomegaly or masses felt. Normal bowel sounds heard. Central nervous system: Alert and oriented. No focal neurological deficits. Extremities: Symmetric 5 x 5 power. Skin: No rashes, lesions or ulcers Psychiatry: Judgement and insight appear normal. Mood & affect appropriate.     Data Reviewed: I have personally reviewed  following labs and imaging studies  CBC: Recent Labs  Lab 11/17/19 1827 11/19/19 0435 11/22/19 0212  WBC 5.4 4.8 5.0  NEUTROABS 2.6  --   --   HGB 11.0* 10.4* 10.2*  HCT 35.0* 32.9* 32.6*  MCV 84.1 84.1 85.1  PLT 253 226 638   Basic Metabolic Panel: Recent Labs  Lab 11/17/19 1827 11/19/19 0435 11/22/19 0212  NA 136 137 135  K 3.7 3.8 3.8  CL 99 106 102  CO2 24 23 24   GLUCOSE 98 103* 104*  BUN 23* 19 19  CREATININE 0.93 0.79 0.88  CALCIUM 9.6 9.1 9.2   GFR: Estimated Creatinine Clearance: 121.5 mL/min (by C-G formula based on SCr of 0.88 mg/dL). Liver Function Tests: Recent Labs  Lab 11/17/19 1827 11/22/19 0212  AST 33 29  ALT 47* 44  ALKPHOS 83 70  BILITOT 0.5 0.5  PROT 8.1 7.0  ALBUMIN 4.1 3.5   No results for input(s): LIPASE, AMYLASE in the last 168 hours. No results for input(s): AMMONIA in the last 168 hours. Coagulation Profile: No results for input(s): INR, PROTIME in the last 168 hours. Cardiac Enzymes: No results for input(s): CKTOTAL, CKMB, CKMBINDEX, TROPONINI in the last 168 hours. BNP (last 3 results) No results for input(s): PROBNP in the last 8760 hours. HbA1C: No results for input(s): HGBA1C in the last 72 hours. CBG: No results for input(s): GLUCAP in the  last 168 hours. Lipid Profile: No results for input(s): CHOL, HDL, LDLCALC, TRIG, CHOLHDL, LDLDIRECT in the last 72 hours. Thyroid Function Tests: No results for input(s): TSH, T4TOTAL, FREET4, T3FREE, THYROIDAB in the last 72 hours. Anemia Panel: No results for input(s): VITAMINB12, FOLATE, FERRITIN, TIBC, IRON, RETICCTPCT in the last 72 hours. Sepsis Labs: No results for input(s): PROCALCITON, LATICACIDVEN in the last 168 hours.  Recent Results (from the past 240 hour(s))  Respiratory Panel by RT PCR (Flu A&B, Covid) - Nasopharyngeal Swab     Status: None   Collection Time: 11/17/19 11:14 PM   Specimen: Nasopharyngeal Swab  Result Value Ref Range Status   SARS Coronavirus 2 by  RT PCR NEGATIVE NEGATIVE Final    Comment: (NOTE) SARS-CoV-2 target nucleic acids are NOT DETECTED.  The SARS-CoV-2 RNA is generally detectable in upper respiratoy specimens during the acute phase of infection. The lowest concentration of SARS-CoV-2 viral copies this assay can detect is 131 copies/mL. A negative result does not preclude SARS-Cov-2 infection and should not be used as the sole basis for treatment or other patient management decisions. A negative result may occur with  improper specimen collection/handling, submission of specimen other than nasopharyngeal swab, presence of viral mutation(s) within the areas targeted by this assay, and inadequate number of viral copies (<131 copies/mL). A negative result must be combined with clinical observations, patient history, and epidemiological information. The expected result is Negative.  Fact Sheet for Patients:  PinkCheek.be  Fact Sheet for Healthcare Providers:  GravelBags.it  This test is no t yet approved or cleared by the [place] FDA and  has been authorized for detection and/or diagnosis of SARS-CoV-2 by FDA under an Emergency Use Authorization (EUA). This EUA will remain  in effect (meaning this test can be used) for the duration of the COVID-19 declaration under Section 564(b)(1) of the Act, 21 U.S.C. section 360bbb-3(b)(1), unless the authorization is terminated or revoked sooner.     Influenza A by PCR NEGATIVE NEGATIVE Final   Influenza B by PCR NEGATIVE NEGATIVE Final    Comment: (NOTE) The Xpert Xpress SARS-CoV-2/FLU/RSV assay is intended as an aid in  the diagnosis of influenza from Nasopharyngeal swab specimens and  should not be used as a sole basis for treatment. Nasal washings and  aspirates are unacceptable for Xpert Xpress SARS-CoV-2/FLU/RSV  testing.  Fact Sheet for Patients: PinkCheek.be  Fact Sheet for  Healthcare Providers: GravelBags.it  This test is not yet approved or cleared by the [place] FDA and  has been authorized for detection and/or diagnosis of SARS-CoV-2 by  FDA under an Emergency Use Authorization (EUA). This EUA will remain  in effect (meaning this test can be used) for the duration of the  Covid-19 declaration under Section 564(b)(1) of the Act, 21  U.S.C. section 360bbb-3(b)(1), unless the authorization is  terminated or revoked. Performed at [facility], [place] [address], [place], [place] [address]   Culture, blood (routine x 2)     Status: None (Preliminary result)   Collection Time: 11/18/19  7:49 PM   Specimen: BLOOD  Result Value Ref Range Status   Specimen Description BLOOD RIGHT ANTECUBITAL  Final   Special Requests   Final    BOTTLES DRAWN AEROBIC AND ANAEROBIC Blood Culture adequate volume   Culture   Final    NO GROWTH 4 DAYS Performed at [facility], [place] [address] [place], [place] [address]    Report Status PENDING  Incomplete  Culture,  blood (routine x 2)     Status: None (Preliminary result)   Collection Time: 11/19/19  4:35 AM   Specimen: BLOOD LEFT HAND  Result Value Ref Range Status   Specimen Description BLOOD LEFT HAND  Final   Special Requests   Final    BOTTLES DRAWN AEROBIC AND ANAEROBIC Blood Culture adequate volume   Culture   Final    NO GROWTH 3 DAYS Performed at [facility], [address], [place], [place] [address]    Report Status PENDING  Incomplete         Radiology Studies: No results found.      Scheduled Meds: . buprenorphine-naloxone  1 tablet Sublingual BID  . enoxaparin (LOVENOX) injection  40 mg Subcutaneous Q24H  . feeding supplement  237 mL Oral BID BM  . influenza vac split quadrivalent PF  0.5 mL Intramuscular Tomorrow-1000  . multivitamin with minerals  1 tablet Oral Daily  . sodium chloride flush  10-40 mL Intracatheter Q12H   Continuous  Infusions: . cefTRIAXone (ROCEPHIN)  IV Stopped (11/22/19 0140)  . vancomycin 1,500 mg (11/22/19 0308)     LOS: 4 days    Time spent: 35 minutes spent on chart review, discussion with nursing staff, consultants, updating family and interview/physical exam; more than 50% of that time was spent in counseling and/or coordination of care.    Surya Schroeter J [place] DO [facility] Available via Epic secure chat 7am-7pm After these hours, please refer to coverage provider listed on [email or web address] 11/22/2019, 10:27 AM

## 2019-11-22 NOTE — Progress Notes (Signed)
[facility]  Date of Admission:  11/17/2019      Total days of antibiotics 6  Vancomycin + ceftriaxone            ASSESSMENT: Daniel Raymond is a 34 y.o. male with a history of partially treated c-spine septic arthritis since June 2021. MRI 10/28 with C2-3 facet joint arthritis w/o any acute osteomyelitis - there is possible C6-7 chronic osteomyelitis vs degenerative changes. Had a long discussion with him about risks of this going under treated including paralysis and death given high level on spine. No surgery indications at present. No pathogen to target. Will continue Vancomycin and Ceftriaxone. He is motivated to stay the duration of 6 weeks with hopes to bridge to drug abuse program thereafter.  Opioid withdrawal currently well managed on Suboxone.  HIV non-reactive. Will   10/25 labs - ESR 38   CRP 32  Hepatitis C RNA + 173,967 at [facility] on 9/19 --> no indications for treatment presently. Will address this outpatient should he be interested in treatment.     PLAN: 1. Continue Ceftriaxone + Vancomycin  2. Follow creatinine and vanc troughs. 3. ESR / CRP weekly  4. BMP twice weekly once stable  5. CBC with diff weekly  6. Hepatitis C treatment outpatient after acute infection resolved 7. Will likely need midline exchanged for PICC at some point given duration of therapy with Vanc.  8. Appreciate care for coordination to drug rehab program   Principal Problem:   Subacute osteomyelitis of cervical spine ([name]) Active Problems:   IV drug abuse ([name])   Polysubstance dependence including opioid type drug with complication, episodic abuse, with unspecified complication (HCC)   Tobacco dependence   Hepatitis C   Methamphetamine dependence, continuous (HCC)    buprenorphine-naloxone  1 tablet Sublingual BID   enoxaparin (LOVENOX) injection  40 mg Subcutaneous Q24H   feeding supplement  237 mL Oral BID BM   influenza vac split  quadrivalent PF  0.5 mL Intramuscular Tomorrow-1000   multivitamin with minerals  1 tablet Oral Daily   sodium chloride flush  10-40 mL Intracatheter Q12H    SUBJECTIVE: Doing better. Suboxone has been helpful for him. Left [facility] and came here within 24 hours due to being "treated like a child" there.  He says he brought his bags here and just needs a place that will work with him and acknowledge his addiction. He is motivated to seek transfer of care to rehab outpatient at discharge.  No trouble with antibiotics and glad he has a midline.  Neck pain is ongoing but no changes in extremity sensation / movement. Able to walk around freely in the room and upper extremity motor is normal.    Review of Systems: Review of Systems  Constitutional: Negative for chills and fever.  HENT: Negative for tinnitus.   Eyes: Negative for blurred vision and photophobia.  Respiratory: Negative for cough and sputum production.   Cardiovascular: Negative for chest pain.  Gastrointestinal: Negative for diarrhea, nausea and vomiting.  Genitourinary: Negative for dysuria.  Musculoskeletal: Positive for neck pain.  Skin: Negative for rash.  Neurological: Negative for headaches.  Psychiatric/Behavioral: Positive for substance abuse.    Allergies  Allergen Reactions   Ibuprofen Other (See Comments)    Stomach pains    OBJECTIVE: Vitals:   11/21/19 1559 11/21/19 1925 11/22/19 0539 11/22/19 0756  BP: 108/69 127/79 108/72 124/88  Pulse: 79 82 76 91  Resp:  _0 Temp: 98.6 F (37 C) 98.4 F (36.9 C) 98.2 F (36.8 C) 98.3 F (36.8 C)  TempSrc: Oral Oral Oral Oral  SpO2: 100% 96% 99% 100%  Weight:      Height:       Body mass index is 22.97 kg/m.  Physical Exam Constitutional:      Appearance: Normal appearance. He is not ill-appearing.  Eyes:     General: No scleral icterus. Neck:     Comments: Stiff range of motion demonstrated with limited lateral rotation bilaterally.   Cardiovascular:     Rate and Rhythm: Normal rate and regular rhythm.     Pulses: Normal pulses.     Heart sounds: Normal heart sounds.  Pulmonary:     Effort: Pulmonary effort is normal.     Breath sounds: Normal breath sounds.  Abdominal:     General: Bowel sounds are normal. There is no distension.     Palpations: Abdomen is soft. There is no fluid wave, hepatomegaly or splenomegaly.     Tenderness: There is no abdominal tenderness.  Musculoskeletal:        General: No swelling.     Right lower leg: No edema.     Left lower leg: No edema.  Skin:    Capillary Refill: Capillary refill takes less than 2 seconds.     Coloration: Skin is not jaundiced.     Findings: No rash.  Neurological:     General: No focal deficit present.     Mental Status: He is alert and oriented to person, place, and time.     Sensory: No sensory deficit.     Coordination: Coordination normal.  Psychiatric:        Behavior: Behavior normal.        Judgment: Judgment normal.     Lab Results Lab Results  Component Value Date   WBC 5.0 11/22/2019   HGB 10.2 (L) 11/22/2019   HCT 32.6 (L) 11/22/2019   MCV 85.1 11/22/2019   PLT 252 11/22/2019    Lab Results  Component Value Date   CREATININE 0.88 11/22/2019   BUN 19 11/22/2019   NA 135 11/22/2019   K 3.8 11/22/2019   CL 102 11/22/2019   CO2 24 11/22/2019    Lab Results  Component Value Date   ALT 44 11/22/2019   AST 29 11/22/2019   ALKPHOS 70 11/22/2019   BILITOT 0.5 11/22/2019     Microbiology: Recent Results (from the past 240 hour(s))  Respiratory Panel by RT PCR (Flu A&B, Covid) - Nasopharyngeal Swab     Status: None   Collection Time: 11/17/19 11:14 PM   Specimen: Nasopharyngeal Swab  Result Value Ref Range Status   SARS Coronavirus 2 by RT PCR NEGATIVE NEGATIVE Final    Comment: (NOTE) SARS-CoV-2 target nucleic acids are NOT DETECTED.  The SARS-CoV-2 RNA is generally detectable in upper respiratoy specimens during the acute  phase of infection. The lowest concentration of SARS-CoV-2 viral copies this assay can detect is 131 copies/mL. A negative result does not preclude SARS-Cov-2 infection and should not be used as the sole basis for treatment or other patient management decisions. A negative result may occur with  improper specimen collection/handling, submission of specimen other than nasopharyngeal swab, presence of viral mutation(s) within the areas targeted by this assay, and inadequate number of viral copies (<131 copies/mL). A negative result must be combined with clinical observations, patient history, and epidemiological information. The expected result is  Negative.  Fact Sheet for Patients:  PinkCheek.be  Fact Sheet for Healthcare Providers:  GravelBags.it  This test is no t yet approved or cleared by the [place] FDA and  has been authorized for detection and/or diagnosis of SARS-CoV-2 by FDA under an Emergency Use Authorization (EUA). This EUA will remain  in effect (meaning this test can be used) for the duration of the COVID-19 declaration under Section 564(b)(1) of the Act, 21 U.S.C. section 360bbb-3(b)(1), unless the authorization is terminated or revoked sooner.     Influenza A by PCR NEGATIVE NEGATIVE Final   Influenza B by PCR NEGATIVE NEGATIVE Final    Comment: (NOTE) The Xpert Xpress SARS-CoV-2/FLU/RSV assay is intended as an aid in  the diagnosis of influenza from Nasopharyngeal swab specimens and  should not be used as a sole basis for treatment. Nasal washings and  aspirates are unacceptable for Xpert Xpress SARS-CoV-2/FLU/RSV  testing.  Fact Sheet for Patients: PinkCheek.be  Fact Sheet for Healthcare Providers: GravelBags.it  This test is not yet approved or cleared by the [place] FDA and  has been authorized for detection and/or diagnosis of  SARS-CoV-2 by  FDA under an Emergency Use Authorization (EUA). This EUA will remain  in effect (meaning this test can be used) for the duration of the  Covid-19 declaration under Section 564(b)(1) of the Act, 21  U.S.C. section 360bbb-3(b)(1), unless the authorization is  terminated or revoked. Performed at [facility], [place] [address], [place], [place] [address]   Culture, blood (routine x 2)     Status: None (Preliminary result)   Collection Time: 11/18/19  7:49 PM   Specimen: BLOOD  Result Value Ref Range Status   Specimen Description BLOOD RIGHT ANTECUBITAL  Final   Special Requests   Final    BOTTLES DRAWN AEROBIC AND ANAEROBIC Blood Culture adequate volume   Culture   Final    NO GROWTH 4 DAYS Performed at [facility], [place] [address] [place], [place] [address]    Report Status PENDING  Incomplete  Culture, blood (routine x 2)     Status: None (Preliminary result)   Collection Time: 11/19/19  4:35 AM   Specimen: BLOOD LEFT HAND  Result Value Ref Range Status   Specimen Description BLOOD LEFT HAND  Final   Special Requests   Final    BOTTLES DRAWN AEROBIC AND ANAEROBIC Blood Culture adequate volume   Culture   Final    NO GROWTH 3 DAYS Performed at [facility], [place] [address] [place], [place] [address]    Report Status PENDING  Incomplete     Janene Madeira, MSN, NP-C [facility]ious [facility] [email or web address] Pager: [phone] Office: [phone] [place]: [phone]

## 2019-11-23 LAB — CULTURE, BLOOD (ROUTINE X 2)
Culture: NO GROWTH
Special Requests: ADEQUATE

## 2019-11-23 LAB — C-REACTIVE PROTEIN: CRP: 1.3 mg/dL — ABNORMAL HIGH (ref <1.0)

## 2019-11-23 LAB — SEDIMENTATION RATE: Sed Rate: 26 mm/hr — ABNORMAL HIGH (ref 0–16)

## 2019-11-23 MED ORDER — KETOROLAC TROMETHAMINE 30 MG/ML IJ SOLN
30.0000 mg | Freq: Once | INTRAMUSCULAR | Status: AC
  Administered 2019-11-23: 30 mg via INTRAVENOUS
  Filled 2019-11-23: qty 1

## 2019-11-23 NOTE — Progress Notes (Signed)
Pharmacy Antibiotic Note  Klyde Banka is a 34 y.o. male admitted on 11/17/2019 with discitis, left outside hospital AMA.  Pharmacy has been consulted for vancomycin dosing.  BCx from [facility] 10/18 ngtd, therapeutic lvls on vancomycin 1500mg  IV every 12 hours with last dose charted 10/26 AM before pt left AMA.  Also was on ceftriaxone 2g q24h, per MD.  Scr 0.88 (stable)  Vancomycin trough to be drawn on 11/3 Pt also received a dose of oritavancin 10/28 per ID incase he leaves AMA again  Plan: Continue vancomycin 1500 mg IV every 12 hours Vancomycin trough with AM dose Monitor renal function, ID recs and LOT Vancomycin troughs as needed  Height: 5\' 10"  (177.8 cm) Weight: 72.6 kg (160 lb 0.9 oz) IBW/kg (Calculated) : 73  Temp (24hrs), Avg:98.3 F (36.8 C), Min:97.7 F (36.5 C), Max:98.9 F (37.2 C)  Recent Labs  Lab 11/17/19 1827 11/19/19 0435 11/20/19 1548 11/22/19 0212  WBC 5.4 4.8  --  5.0  CREATININE 0.93 0.79  --  0.88  VANCOTROUGH  --   --  15  --     Estimated Creatinine Clearance: 121.5 mL/min (by C-G formula based on SCr of 0.88 mg/dL).    Allergies  Allergen Reactions  . Ibuprofen Other (See Comments)    Stomach pains    Antonetta Clanton A. 11/22/19, PharmD, BCPS, FNKF Clinical Pharmacist [place] Please utilize Amion for appropriate phone number to reach the unit pharmacist [facility])   11/23/2019 8:11 AM

## 2019-11-23 NOTE — Plan of Care (Signed)

## 2019-11-23 NOTE — Progress Notes (Signed)
PROGRESS NOTE    Daniel Raymond  GYF:[id number] DOB: March 31, 1985 DOA: 11/17/2019 PCP: Patient, No Pcp Per    Brief Narrative:  Daniel Raymond is a 34 year old male with past medical history notable for IVDA, tobacco dependence, hepatitis C, asthma, chronic osteomyelitis of the cervical spine who presented to the ED with neck pain.  Osteomyelitis initially diagnosed June 2021 at [facility], but he left AMA.  Admitted at [facility] from 9/18-26 for cervical osteomyelitis, treated with vancomycin and Rocephin with TTE 09/2019 without obvious vegetations; left AMA on 9/26.  Presented to [facility] on 10/4 and again left AMA.  Last admitted at [facility] from 10/18-26 with C-spine osteomyelitis, again treated with vancomycin and Rocephin.  He was started on Subutex by psychiatry during hospitalization but was found to be using heroin so this was discontinued.  He left AMA on 10/23/2019.  Since leaving the hospital he has not finished his antibiotics and felt like he needed to come back to finish them.  He does report wanting to stop using illicit drugs and is willing to continue buprenorphine again.  He acknowledges using heroin in the hospital last week.  In the ED, MRI C-spine noted C2-C3 septic arthritis.  Neurosurgery recommended medical management with IV antibiotic therapy for several weeks.  Hospital service consulted for admission.   Assessment & Plan:   Principal Problem:   Subacute osteomyelitis of cervical spine (HCC) Active Problems:   IV drug abuse (HCC)   Polysubstance dependence including opioid type drug with complication, episodic abuse, with unspecified complication (HCC)   Tobacco dependence   Hepatitis C   Methamphetamine dependence, continuous (HCC)   Cervical spine osteomyelitis, septic arthritis Patient presenting following multiple recent hospitalizations after leaving AMA with continued neck pain.  MR C-spine with abnormal  signal/contrast C2--3 facet joint consistent with septic arthritis with no evidence of active discitis/osteomyelitis but with chronic osteomyelitis C6/7.  Patient is afebrile without leukocytosis.  ESR 27, CRP 0.9.  ED provider discussed case with neurosurgery PA, Kim who advised no intervention without epidural involvement and recommended medical management. --Infectious disease following, appreciate assistance --QuantiFERON gold: Pending --Blood cultures x2: no growth x 5 days --Received oritavancin on 11/18/2019  --Continue empiric antibiotics with ceftriaxone and vancomycin --Await further ID recommendations  Hepatitis C Viral load 173,697 on 10/10/2019 at [facility].  Is treatment nave due to ongoing IV drug abuse.  Per ID will follow up outpatient.  Polysubstance abuse Patient with long standing history of heroin dependence, has history of leaving AMA and returning to different hospital ED's.  Recently started on Suboxone at [facility], but was discontinued as he was using heroin inpatient.  He currently desires to quit. --Suboxone 8-50m PO BID --Hydroxyzine 25 mg q6h prn anxiety --continue to montior on COWS protocol --restrictive visitation --Patient wishes for drug rehabilitation on discharge, social work for assistance  Tobacco dependence Counseled on need for complete cessation. --Nicotine patch   DVT prophylaxis: Lovenox Code Status: Full code Family Communication: No family present at bedside  Disposition Plan:  Status is: Inpatient  Remains inpatient appropriate because:Ongoing active pain requiring inpatient pain management, Unsafe d/c plan, IV treatments appropriate due to intensity of illness or inability to take PO and Inpatient level of care appropriate due to severity of illness   Dispo: The patient is from: Home              Anticipated d/c is to: Home  Anticipated d/c date is: > 3 days              Patient currently is not medically  stable to d/c.  Consultants:   Neurosurgery, EDP discussed with PA Kim via telephone  Infectious disease  Psychiatry  Procedures:   None  Antimicrobials:   Oritavancin 10/28  Vancomycin 10/28>>  Ceftriaxone 10/28>>   Subjective: Patient seen and examined bedside, resting comfortably.  No complaints this morning.  Denies headache, no chest pain, no palpitations, no shortness of breath, no abdominal pain.  No acute events overnight per nurse staff.  Objective: Vitals:   11/22/19 0756 11/22/19 1500 11/22/19 2003 11/23/19 0458  BP: 124/88 128/90 122/71 101/67  Pulse: 91  99 72  Resp: 15 16 19 17   Temp: 98.3 F (36.8 C) 98.3 F (36.8 C) 97.7 F (36.5 C) 98.9 F (37.2 C)  TempSrc: Oral Oral Oral Oral  SpO2: 100% 100% 100% 100%  Weight:      Height:        Intake/Output Summary (Last 24 hours) at 11/23/2019 1215 Last data filed at 11/23/2019 0600 Gross per 24 hour  Intake 1360.18 ml  Output --  Net 1360.18 ml   Filed Weights   11/17/19 1804 11/18/19 1507  Weight: 72.6 kg 72.6 kg    Examination:  General exam: Appears calm and comfortable, appears older than stated age Respiratory system: Clear to auscultation. Respiratory effort normal.  Oxygenating well on room air Cardiovascular system: S1 & S2 heard, RRR. No JVD, murmurs, rubs, gallops or clicks. No pedal edema. Gastrointestinal system: Abdomen is nondistended, soft and nontender. No organomegaly or masses felt. Normal bowel sounds heard. Central nervous system: Alert and oriented. No focal neurological deficits. Extremities: Symmetric 5 x 5 power. Skin: No rashes, lesions or ulcers Psychiatry: Judgement and insight appear normal. Mood & affect appropriate.     Data Reviewed: I have personally reviewed following labs and imaging studies  CBC: Recent Labs  Lab 11/17/19 1827 11/19/19 0435 11/22/19 0212  WBC 5.4 4.8 5.0  NEUTROABS 2.6  --   --   HGB 11.0* 10.4* 10.2*  HCT 35.0* 32.9* 32.6*  MCV  84.1 84.1 85.1  PLT 253 226 465   Basic Metabolic Panel: Recent Labs  Lab 11/17/19 1827 11/19/19 0435 11/22/19 0212  NA 136 137 135  K 3.7 3.8 3.8  CL 99 106 102  CO2 24 23 24   GLUCOSE 98 103* 104*  BUN 23* 19 19  CREATININE 0.93 0.79 0.88  CALCIUM 9.6 9.1 9.2   GFR: Estimated Creatinine Clearance: 121.5 mL/min (by C-G formula based on SCr of 0.88 mg/dL). Liver Function Tests: Recent Labs  Lab 11/17/19 1827 11/22/19 0212  AST 33 29  ALT 47* 44  ALKPHOS 83 70  BILITOT 0.5 0.5  PROT 8.1 7.0  ALBUMIN 4.1 3.5   No results for input(s): LIPASE, AMYLASE in the last 168 hours. No results for input(s): AMMONIA in the last 168 hours. Coagulation Profile: No results for input(s): INR, PROTIME in the last 168 hours. Cardiac Enzymes: No results for input(s): CKTOTAL, CKMB, CKMBINDEX, TROPONINI in the last 168 hours. BNP (last 3 results) No results for input(s): PROBNP in the last 8760 hours. HbA1C: No results for input(s): HGBA1C in the last 72 hours. CBG: No results for input(s): GLUCAP in the last 168 hours. Lipid Profile: No results for input(s): CHOL, HDL, LDLCALC, TRIG, CHOLHDL, LDLDIRECT in the last 72 hours. Thyroid Function Tests: No results for input(s): TSH, T4TOTAL,  FREET4, T3FREE, THYROIDAB in the last 72 hours. Anemia Panel: No results for input(s): VITAMINB12, FOLATE, FERRITIN, TIBC, IRON, RETICCTPCT in the last 72 hours. Sepsis Labs: No results for input(s): PROCALCITON, LATICACIDVEN in the last 168 hours.  Recent Results (from the past 240 hour(s))  Respiratory Panel by RT PCR (Flu A&B, Covid) - Nasopharyngeal Swab     Status: None   Collection Time: 11/17/19 11:14 PM   Specimen: Nasopharyngeal Swab  Result Value Ref Range Status   SARS Coronavirus 2 by RT PCR NEGATIVE NEGATIVE Final    Comment: (NOTE) SARS-CoV-2 target nucleic acids are NOT DETECTED.  The SARS-CoV-2 RNA is generally detectable in upper respiratoy specimens during the acute phase of  infection. The lowest concentration of SARS-CoV-2 viral copies this assay can detect is 131 copies/mL. A negative result does not preclude SARS-Cov-2 infection and should not be used as the sole basis for treatment or other patient management decisions. A negative result may occur with  improper specimen collection/handling, submission of specimen other than nasopharyngeal swab, presence of viral mutation(s) within the areas targeted by this assay, and inadequate number of viral copies (<131 copies/mL). A negative result must be combined with clinical observations, patient history, and epidemiological information. The expected result is Negative.  Fact Sheet for Patients:  PinkCheek.be  Fact Sheet for Healthcare Providers:  GravelBags.it  This test is no t yet approved or cleared by the [place] FDA and  has been authorized for detection and/or diagnosis of SARS-CoV-2 by FDA under an Emergency Use Authorization (EUA). This EUA will remain  in effect (meaning this test can be used) for the duration of the COVID-19 declaration under Section 564(b)(1) of the Act, 21 U.S.C. section 360bbb-3(b)(1), unless the authorization is terminated or revoked sooner.     Influenza A by PCR NEGATIVE NEGATIVE Final   Influenza B by PCR NEGATIVE NEGATIVE Final    Comment: (NOTE) The Xpert Xpress SARS-CoV-2/FLU/RSV assay is intended as an aid in  the diagnosis of influenza from Nasopharyngeal swab specimens and  should not be used as a sole basis for treatment. Nasal washings and  aspirates are unacceptable for Xpert Xpress SARS-CoV-2/FLU/RSV  testing.  Fact Sheet for Patients: PinkCheek.be  Fact Sheet for Healthcare Providers: GravelBags.it  This test is not yet approved or cleared by the [place] FDA and  has been authorized for detection and/or diagnosis of SARS-CoV-2  by  FDA under an Emergency Use Authorization (EUA). This EUA will remain  in effect (meaning this test can be used) for the duration of the  Covid-19 declaration under Section 564(b)(1) of the Act, 21  U.S.C. section 360bbb-3(b)(1), unless the authorization is  terminated or revoked. Performed at [facility], [place] [address], [place], [place] [address]   Culture, blood (routine x 2)     Status: None   Collection Time: 11/18/19  7:49 PM   Specimen: BLOOD  Result Value Ref Range Status   Specimen Description BLOOD RIGHT ANTECUBITAL  Final   Special Requests   Final    BOTTLES DRAWN AEROBIC AND ANAEROBIC Blood Culture adequate volume   Culture   Final    NO GROWTH 5 DAYS Performed at [facility], [place] [address], [place], [place] [address]    Report Status 11/23/2019 FINAL  Final  Culture, blood (routine x 2)     Status: None (Preliminary result)   Collection Time: 11/19/19  4:35 AM   Specimen: BLOOD LEFT HAND  Result Value Ref  Range Status   Specimen Description BLOOD LEFT HAND  Final   Special Requests   Final    BOTTLES DRAWN AEROBIC AND ANAEROBIC Blood Culture adequate volume   Culture   Final    NO GROWTH 4 DAYS Performed at [facility], [address] [place], [place] [address]    Report Status PENDING  Incomplete         Radiology Studies: No results found.      Scheduled Meds: . buprenorphine-naloxone  1 tablet Sublingual BID  . enoxaparin (LOVENOX) injection  40 mg Subcutaneous Q24H  . feeding supplement  237 mL Oral BID BM  . influenza vac split quadrivalent PF  0.5 mL Intramuscular Tomorrow-1000  . multivitamin with minerals  1 tablet Oral Daily  . sodium chloride flush  10-40 mL Intracatheter Q12H   Continuous Infusions: . cefTRIAXone (ROCEPHIN)  IV 2 g (11/23/19 0525)  . vancomycin 1,500 mg (11/23/19 0305)     LOS: 5 days    Time spent: 35 minutes spent on chart review, discussion with nursing staff, consultants, updating  family and interview/physical exam; more than 50% of that time was spent in counseling and/or coordination of care.    Keylen Uzelac J [place] DO [facility] Available via Epic secure chat 7am-7pm After these hours, please refer to coverage provider listed on [email or web address] 11/23/2019, 12:15 PM

## 2019-11-24 LAB — VANCOMYCIN, TROUGH: Vancomycin Tr: 17 ug/mL (ref 15–20)

## 2019-11-24 LAB — CULTURE, BLOOD (ROUTINE X 2)
Culture: NO GROWTH
Special Requests: ADEQUATE

## 2019-11-24 LAB — QUANTIFERON-TB GOLD PLUS (RQFGPL)
QuantiFERON Mitogen Value: >10 IU/mL
QuantiFERON Nil Value: 0.08 IU/mL
QuantiFERON TB1 Ag Value: 0.08 IU/mL
QuantiFERON TB2 Ag Value: 0.08 IU/mL

## 2019-11-24 LAB — QUANTIFERON-TB GOLD PLUS: QuantiFERON-TB Gold Plus: NEGATIVE

## 2019-11-24 NOTE — Progress Notes (Signed)
Pharmacy Antibiotic Note  Daniel Raymond is a 34 y.o. male admitted on 11/17/2019 with discitis, left outside hospital AMA.  Pharmacy has been consulted for vancomycin dosing.  BCx from [facility] 10/18 ngtd, therapeutic lvls on vancomycin 1500mg  IV every 12 hours with last dose charted 10/26 AM before pt left AMA.  Also was on ceftriaxone 2g q24h, per MD.  Scr 0.88 (stable)  Vancomycin trough drawn on time on 11/3 was 17. Within the goal range of 15-20. Pt plans to stay for his entire treatment course here 6-8 weeks depending on improvement.    Plan: Continue vancomycin 1500 mg IV every 12 hours Vancomycin trough as indicated Monitor renal function   Height: 5\' 10"  (177.8 cm) Weight: 72.6 kg (160 lb 0.9 oz) IBW/kg (Calculated) : 73  Temp (24hrs), Avg:98 F (36.7 C), Min:97.8 F (36.6 C), Max:98.3 F (36.8 C)  Recent Labs  Lab 11/17/19 1827 11/19/19 0435 11/20/19 1548 11/22/19 0212 11/24/19 0434  WBC 5.4 4.8  --  5.0  --   CREATININE 0.93 0.79  --  0.88  --   VANCOTROUGH  --   --  15  --  17    Estimated Creatinine Clearance: 121.5 mL/min (by C-G formula based on SCr of 0.88 mg/dL).    Allergies  Allergen Reactions  . Ibuprofen Other (See Comments)    Stomach pains    13/01/21, PharmD Infectious Disease Pharmacist  Phone: [phone]   11/24/2019 8:02 AM

## 2019-11-24 NOTE — TOC CAGE-AID Note (Signed)
Transition of Care [facility]) - CAGE-AID Screening   Patient Details  Name: Margarita Bobrowski MRN: [id number] Date of Birth: 1985/07/08  Transition of Care [facility]) CM/SW Contact:    Emeterio Reeve, [place] Phone Number: 11/24/2019, 2:43 PM   Clinical Narrative: CSW met with pt at bedside. CSW introduced self and explained her role at the hospital.  Pt reports almost daily substance use. CSW and pt discussed IV substance use and safe needle exchange programs. Pt states he has not been to treatment in the past but is interested in going after leaving the hospital. Pt thanked csw for resources and stated he will look over them and will make a decision.    CAGE-AID Screening:    Have You Ever Felt You Ought to Cut Down on Your Drinking or Drug Use?: Yes Have People Annoyed You By Critizing Your Drinking Or Drug Use?: Yes Have You Felt Bad Or Guilty About Your Drinking Or Drug Use?: Yes Have You Ever Had a Drink or Used Drugs First Thing In The Morning to Steady Your Nerves or to Get Rid of a Hangover?: Yes CAGE-AID Score: 4  Substance Abuse Education Offered: Yes  Substance abuse interventions: Patient Counseling, Educational Materials, Referral to (must comment)   [place] [place] Clinical Social Worker [phone]

## 2019-11-24 NOTE — Plan of Care (Signed)

## 2019-11-24 NOTE — Progress Notes (Signed)
PROGRESS NOTE    Herny Scurlock  TJQ:[id number] DOB: Nov 01, 1985 DOA: 11/17/2019 PCP: Patient, No Pcp Per    Brief Narrative:  Rubin Dais is a 34 year old male with past medical history notable for IVDA, tobacco dependence, hepatitis C, asthma, chronic osteomyelitis of the cervical spine who presented to the ED with neck pain.  Osteomyelitis initially diagnosed June 2021 at [facility], but he left AMA.  Admitted at [facility] from 9/18-26 for cervical osteomyelitis, treated with vancomycin and Rocephin with TTE 09/2019 without obvious vegetations; left AMA on 9/26.  Presented to [facility] on 10/4 and again left AMA.  Last admitted at [facility] from 10/18-26 with C-spine osteomyelitis, again treated with vancomycin and Rocephin.  He was started on Subutex by psychiatry during hospitalization but was found to be using heroin so this was discontinued.  He left AMA on 10/23/2019.  Since leaving the hospital he has not finished his antibiotics and felt like he needed to come back to finish them.  He does report wanting to stop using illicit drugs and is willing to continue buprenorphine again.  He acknowledges using heroin in the hospital last week.  In the ED, MRI C-spine noted C2-C3 septic arthritis.  Neurosurgery recommended medical management with IV antibiotic therapy for several weeks.  Hospital service consulted for admission.   Assessment & Plan:   Principal Problem:   Subacute osteomyelitis of cervical spine (HCC) Active Problems:   IV drug abuse (HCC)   Polysubstance dependence including opioid type drug with complication, episodic abuse, with unspecified complication (HCC)   Tobacco dependence   Hepatitis C   Methamphetamine dependence, continuous (HCC)   Cervical spine osteomyelitis, septic arthritis Patient presenting following multiple recent hospitalizations after leaving AMA with continued neck pain.  MR C-spine with abnormal  signal/contrast C2--3 facet joint consistent with septic arthritis with no evidence of active discitis/osteomyelitis but with chronic osteomyelitis C6/7.  Patient is afebrile without leukocytosis.  ESR 27, CRP 0.9.  ED provider discussed case with neurosurgery PA, Kim who advised no intervention without epidural involvement and recommended medical management. --Infectious disease following, appreciate assistance --QuantiFERON gold: Negative --Blood cultures x2: no growth x 5 days --Received oritavancin on 11/18/2019  --Continue empiric antibiotics with ceftriaxone and vancomycin --Await further ID recommendations  Hepatitis C Viral load 173,697 on 10/10/2019 at [facility].  Is treatment nave due to ongoing IV drug abuse.  Per ID will follow up outpatient.  Polysubstance abuse Patient with long standing history of heroin dependence, has history of leaving AMA and returning to different hospital ED's.  Recently started on Suboxone at [facility], but was discontinued as he was using heroin inpatient.  He currently desires to quit. --Suboxone 8-43m PO BID --Hydroxyzine 25 mg q6h prn anxiety --continue to montior on COWS protocol --restrictive visitation --Patient wishes for drug rehabilitation on discharge, social work for assistance  Tobacco dependence Counseled on need for complete cessation. --Nicotine patch   DVT prophylaxis: Lovenox Code Status: Full code Family Communication: No family present at bedside  Disposition Plan:  Status is: Inpatient  Remains inpatient appropriate because:Ongoing active pain requiring inpatient pain management, Unsafe d/c plan, IV treatments appropriate due to intensity of illness or inability to take PO and Inpatient level of care appropriate due to severity of illness   Dispo: The patient is from: Home              Anticipated d/c is to: Home  Anticipated d/c date is: > 3 days              Patient currently is not  medically stable to d/c.  Consultants:   Neurosurgery, EDP discussed with PA Kim via telephone  Infectious disease  Psychiatry  Procedures:   None  Antimicrobials:   Oritavancin 10/28  Vancomycin 10/28>>  Ceftriaxone 10/28>>   Subjective: Patient sleeping initially on my evaluation.  On repeat evaluation patient still sleeping.  When I woke him up he states that his neck is stiff and is hurting.  No nausea no vomiting.  No fever no chills.  No diarrhea.  Oral anticoagulant.  Objective: Vitals:   11/23/19 0743 11/23/19 1641 11/23/19 1910 11/24/19 0233  BP: 120/69 112/68 116/88 104/71  Pulse: 70 76 71 85  Resp: 18 14 16 20   Temp: 98.3 F (36.8 C) 97.8 F (36.6 C) 98 F (36.7 C) 98.3 F (36.8 C)  TempSrc: Oral Oral Oral Oral  SpO2: 100% 100% 99% 99%  Weight:      Height:        Intake/Output Summary (Last 24 hours) at 11/24/2019 1713 Last data filed at 11/24/2019 1500 Gross per 24 hour  Intake 720 ml  Output --  Net 720 ml   Filed Weights   11/17/19 1804 11/18/19 1507  Weight: 72.6 kg 72.6 kg    Examination:  General exam: Appears calm and comfortable, appears older than stated age Respiratory system: Clear to auscultation. Respiratory effort normal.  Oxygenating well on room air Cardiovascular system: S1 & S2 heard, RRR. No JVD, murmurs, rubs, gallops or clicks. No pedal edema. Gastrointestinal system: Abdomen is nondistended, soft and nontender. No organomegaly or masses felt. Normal bowel sounds heard. Central nervous system: Alert and oriented. No focal neurological deficits. Extremities: Symmetric 5 x 5 power. Skin: No rashes, lesions or ulcers Psychiatry: Judgement and insight appear normal. Mood & affect appropriate.     Data Reviewed: I have personally reviewed following labs and imaging studies  CBC: Recent Labs  Lab 11/17/19 1827 11/19/19 0435 11/22/19 0212  WBC 5.4 4.8 5.0  NEUTROABS 2.6  --   --   HGB 11.0* 10.4* 10.2*  HCT 35.0*  32.9* 32.6*  MCV 84.1 84.1 85.1  PLT 253 226 762   Basic Metabolic Panel: Recent Labs  Lab 11/17/19 1827 11/19/19 0435 11/22/19 0212  NA 136 137 135  K 3.7 3.8 3.8  CL 99 106 102  CO2 24 23 24   GLUCOSE 98 103* 104*  BUN 23* 19 19  CREATININE 0.93 0.79 0.88  CALCIUM 9.6 9.1 9.2   GFR: Estimated Creatinine Clearance: 121.5 mL/min (by C-G formula based on SCr of 0.88 mg/dL). Liver Function Tests: Recent Labs  Lab 11/17/19 1827 11/22/19 0212  AST 33 29  ALT 47* 44  ALKPHOS 83 70  BILITOT 0.5 0.5  PROT 8.1 7.0  ALBUMIN 4.1 3.5   No results for input(s): LIPASE, AMYLASE in the last 168 hours. No results for input(s): AMMONIA in the last 168 hours. Coagulation Profile: No results for input(s): INR, PROTIME in the last 168 hours. Cardiac Enzymes: No results for input(s): CKTOTAL, CKMB, CKMBINDEX, TROPONINI in the last 168 hours. BNP (last 3 results) No results for input(s): PROBNP in the last 8760 hours. HbA1C: No results for input(s): HGBA1C in the last 72 hours. CBG: No results for input(s): GLUCAP in the last 168 hours. Lipid Profile: No results for input(s): CHOL, HDL, LDLCALC, TRIG, CHOLHDL, LDLDIRECT in the last 72  hours. Thyroid Function Tests: No results for input(s): TSH, T4TOTAL, FREET4, T3FREE, THYROIDAB in the last 72 hours. Anemia Panel: No results for input(s): VITAMINB12, FOLATE, FERRITIN, TIBC, IRON, RETICCTPCT in the last 72 hours. Sepsis Labs: No results for input(s): PROCALCITON, LATICACIDVEN in the last 168 hours.  Recent Results (from the past 240 hour(s))  Respiratory Panel by RT PCR (Flu A&B, Covid) - Nasopharyngeal Swab     Status: None   Collection Time: 11/17/19 11:14 PM   Specimen: Nasopharyngeal Swab  Result Value Ref Range Status   SARS Coronavirus 2 by RT PCR NEGATIVE NEGATIVE Final    Comment: (NOTE) SARS-CoV-2 target nucleic acids are NOT DETECTED.  The SARS-CoV-2 RNA is generally detectable in upper respiratoy specimens during  the acute phase of infection. The lowest concentration of SARS-CoV-2 viral copies this assay can detect is 131 copies/mL. A negative result does not preclude SARS-Cov-2 infection and should not be used as the sole basis for treatment or other patient management decisions. A negative result may occur with  improper specimen collection/handling, submission of specimen other than nasopharyngeal swab, presence of viral mutation(s) within the areas targeted by this assay, and inadequate number of viral copies (<131 copies/mL). A negative result must be combined with clinical observations, patient history, and epidemiological information. The expected result is Negative.  Fact Sheet for Patients:  PinkCheek.be  Fact Sheet for Healthcare Providers:  GravelBags.it  This test is no t yet approved or cleared by the [place] FDA and  has been authorized for detection and/or diagnosis of SARS-CoV-2 by FDA under an Emergency Use Authorization (EUA). This EUA will remain  in effect (meaning this test can be used) for the duration of the COVID-19 declaration under Section 564(b)(1) of the Act, 21 U.S.C. section 360bbb-3(b)(1), unless the authorization is terminated or revoked sooner.     Influenza A by PCR NEGATIVE NEGATIVE Final   Influenza B by PCR NEGATIVE NEGATIVE Final    Comment: (NOTE) The Xpert Xpress SARS-CoV-2/FLU/RSV assay is intended as an aid in  the diagnosis of influenza from Nasopharyngeal swab specimens and  should not be used as a sole basis for treatment. Nasal washings and  aspirates are unacceptable for Xpert Xpress SARS-CoV-2/FLU/RSV  testing.  Fact Sheet for Patients: PinkCheek.be  Fact Sheet for Healthcare Providers: GravelBags.it  This test is not yet approved or cleared by the [place] FDA and  has been authorized for detection and/or  diagnosis of SARS-CoV-2 by  FDA under an Emergency Use Authorization (EUA). This EUA will remain  in effect (meaning this test can be used) for the duration of the  Covid-19 declaration under Section 564(b)(1) of the Act, 21  U.S.C. section 360bbb-3(b)(1), unless the authorization is  terminated or revoked. Performed at [facility], [place] [address], [place], [place] [address]   Culture, blood (routine x 2)     Status: None   Collection Time: 11/18/19  7:49 PM   Specimen: BLOOD  Result Value Ref Range Status   Specimen Description BLOOD RIGHT ANTECUBITAL  Final   Special Requests   Final    BOTTLES DRAWN AEROBIC AND ANAEROBIC Blood Culture adequate volume   Culture   Final    NO GROWTH 5 DAYS Performed at [facility], [place] [address] [place], [place] [address]    Report Status 11/23/2019 FINAL  Final  Culture, blood (routine x 2)     Status: None   Collection Time: 11/19/19  4:35 AM  Specimen: BLOOD LEFT HAND  Result Value Ref Range Status   Specimen Description BLOOD LEFT HAND  Final   Special Requests   Final    BOTTLES DRAWN AEROBIC AND ANAEROBIC Blood Culture adequate volume   Culture   Final    NO GROWTH 5 DAYS Performed at [facility], [address] [place], [place] [address]    Report Status 11/24/2019 FINAL  Final         Radiology Studies: No results found.      Scheduled Meds: . buprenorphine-naloxone  1 tablet Sublingual BID  . enoxaparin (LOVENOX) injection  40 mg Subcutaneous Q24H  . feeding supplement  237 mL Oral BID BM  . influenza vac split quadrivalent PF  0.5 mL Intramuscular Tomorrow-1000  . multivitamin with minerals  1 tablet Oral Daily  . sodium chloride flush  10-40 mL Intracatheter Q12H   Continuous Infusions: . cefTRIAXone (ROCEPHIN)  IV 2 g (11/24/19 0559)  . vancomycin 1,500 mg (11/24/19 1701)     LOS: 6 days    Time spent: 35 minutes spent on chart review, discussion with nursing staff, consultants,  updating family and interview/physical exam; more than 50% of that time was spent in counseling and/or coordination of care.    Berle Mull, [facility] Available via Epic secure chat 7am-7pm After these hours, please refer to coverage provider listed on [email or web address] 11/24/2019, 5:13 PM

## 2019-11-25 LAB — BASIC METABOLIC PANEL
Anion gap: 11 (ref 5–15)
BUN: 15 mg/dL (ref 6–20)
CO2: 28 mmol/L (ref 22–32)
Calcium: 9.7 mg/dL (ref 8.9–10.3)
Chloride: 101 mmol/L (ref 98–111)
Creatinine, Ser: 0.82 mg/dL (ref 0.61–1.24)
GFR, Estimated: >60 mL/min (ref >60)
Glucose, Bld: 112 mg/dL — ABNORMAL HIGH (ref 70–99)
Potassium: 4.1 mmol/L (ref 3.5–5.1)
Sodium: 140 mmol/L (ref 135–145)

## 2019-11-25 NOTE — Progress Notes (Signed)
[facility] Progress Note  Patient: Daniel Raymond    WCB:[id number]  DOA: 11/17/2019     Date of Service: the patient was seen and examined on 11/25/2019  Brief hospital course: Past medical history of IV drug abuse, tobacco abuse, hep C, asthma, chronic osteomyelitis presents with worsening neck pain. 06/2019 diagnosed with cervical osteomyelitis at [facility].  Left AMA. 9/18-26 admitted at [facility] for the same.  Treated with IV vancomycin and Rocephin.  Left AMA 9/26. 10/4 admitted at [facility].  Left AMA. 10/18-26 admitted at [facility] again.  Left AMA.  Found to be using heroin in the hospital. Currently plan is continue IV antibiotics.  Assessment and Plan: Cervical spine osteomyelitis Septic arthritis Patient presenting following multiple recent hospitalizations after leaving AMA with continued neck pain.  MR C-spine with abnormal signal/contrast C2--3 facet joint consistent with septic arthritis with no evidence of active discitis/osteomyelitis but with chronic osteomyelitis C6/7.   Patient is afebrile without leukocytosis.   ESR 27, CRP 0.9.   ED provider discussed case with neurosurgery PA, Kim who advised no intervention without epidural involvement and recommended medical management. --Infectious disease following, appreciate assistance --QuantiFERON gold: Negative --Blood cultures x2: no growth x 5 days --Received oritavancin on 11/18/2019  --Continue empiric antibiotics with ceftriaxone and vancomycin --Await further ID recommendations  Hepatitis C Viral load 173,697 on 10/10/2019 at [facility].   Is treatment nave due to ongoing IV drug abuse.   Per ID will follow up outpatient.  Patient requesting checkup for gonorrhea.  Polysubstance abuse Patient with long standing history of heroin dependence, has history of leaving AMA and returning to different hospital ED's.   Recently started on  Suboxone at [facility], but was discontinued as he was using heroin inpatient.   He currently desires to quit. --Suboxone 8-51m PO BID --Hydroxyzine 25 mg q6h prn anxiety --continue to montior on COWS protocol --restrictive visitation --Patient wishes for drug rehabilitation on discharge, social work for assistance  Tobacco dependence Counseled on need for complete cessation. --Nicotine patch  Body mass index is 22.97 kg/m.  Nutrition Problem: Increased nutrient needs Etiology: acute illness (osteomyelitis of cervical spine) Interventions: Interventions: Ensure Enlive (each supplement provides 350kcal and 20 grams of protein), MVI      Diet: Regular diet DVT Prophylaxis:   enoxaparin (LOVENOX) injection 40 mg Start: 11/18/19 1000    Advance goals of care discussion: Full code  Family Communication: no family was present at bedside, at the time of interview.   Disposition:  Status is: Inpatient  Remains inpatient appropriate because:Inpatient level of care appropriate due to severity of illness  Dispo: The patient is from: Home              Anticipated d/c is to: Home              Anticipated d/c date is: > 3 days              Patient currently is not medically stable to d/c.  Subjective: Continues to have neck pain.  No nausea no vomiting.  No fever no chills.  No chest pain.  Physical Exam:  General: Appear in mild distress, no Rash; Oral Mucosa Clear, moist. no Abnormal Neck Mass Or lumps, Conjunctiva normal  Cardiovascular: S1 and S2 Present, no Murmur, Respiratory: good respiratory effort, Bilateral Air entry present and CTA, no Crackles, no wheezes Abdomen: Bowel Sound present, Soft and no tenderness Extremities: no Pedal  edema Neurology: alert and oriented to time, place, and person affect appropriate. no new focal deficit Gait not checked due to patient safety concerns  Vitals:   11/24/19 0233 11/24/19 1916 11/25/19 0330 11/25/19 0852  BP: 104/71  113/74 115/81 128/85  Pulse: 85 91 88 92  Resp: 20 20 20 18   Temp: 98.3 F (36.8 C) 98.4 F (36.9 C) 98.5 F (36.9 C) (!) 97.5 F (36.4 C)  TempSrc: Oral Oral Oral Oral  SpO2: 99% 100% 100% 99%  Weight:      Height:        Intake/Output Summary (Last 24 hours) at 11/25/2019 1828 Last data filed at 11/24/2019 2300 Gross per 24 hour  Intake 620 ml  Output --  Net 620 ml   Filed Weights   11/17/19 1804 11/18/19 1507  Weight: 72.6 kg 72.6 kg    Data Reviewed: I have personally reviewed and interpreted daily labs, tele strips, imagings as discussed above. I reviewed all nursing notes, pharmacy notes, vitals, pertinent old records I have discussed plan of care as described above with RN and patient/family.  CBC: Recent Labs  Lab 11/19/19 0435 11/22/19 0212  WBC 4.8 5.0  HGB 10.4* 10.2*  HCT 32.9* 32.6*  MCV 84.1 85.1  PLT 226 017   Basic Metabolic Panel: Recent Labs  Lab 11/19/19 0435 11/22/19 0212 11/25/19 1416  NA 137 135 140  K 3.8 3.8 4.1  CL 106 102 101  CO2 23 24 28   GLUCOSE 103* 104* 112*  BUN 19 19 15   CREATININE 0.79 0.88 0.82  CALCIUM 9.1 9.2 9.7    Studies: No results found.  Scheduled Meds:  buprenorphine-naloxone  1 tablet Sublingual BID   enoxaparin (LOVENOX) injection  40 mg Subcutaneous Q24H   influenza vac split quadrivalent PF  0.5 mL Intramuscular Tomorrow-1000   multivitamin with minerals  1 tablet Oral Daily   sodium chloride flush  10-40 mL Intracatheter Q12H   Continuous Infusions:  cefTRIAXone (ROCEPHIN)  IV 2 g (11/25/19 0506)   vancomycin 1,500 mg (11/25/19 1642)   PRN Meds: acetaminophen **OR** acetaminophen, albuterol, hydrALAZINE, nicotine, ondansetron **OR** ondansetron (ZOFRAN) IV, sodium chloride flush  Time spent: 35 minutes  Author: Berle Mull, MD [facility] 11/25/2019 6:28 PM  To reach On-call, see care teams to locate the attending and reach out via [email or web address]. Between 7PM-7AM, please  contact night-coverage If you still have difficulty reaching the attending provider, please page the [facility] (Director on Call) for [facility] on amion for assistance.

## 2019-11-25 NOTE — Plan of Care (Signed)
Patient is alert and oriented x4. In no acute distress. Has pain to neck, prn tylenol administered with some relief. Patient is self care. Problem: Education: Goal: Knowledge of General Education information will improve Description: Including pain rating scale, medication(s)/side effects and non-pharmacologic comfort measures Outcome: Progressing   Problem: Health Behavior/Discharge Planning: Goal: Ability to manage health-related needs will improve Outcome: Progressing   Problem: Clinical Measurements: Goal: Ability to maintain clinical measurements within normal limits will improve Outcome: Progressing Goal: Will remain free from infection Outcome: Progressing Goal: Diagnostic test results will improve Outcome: Progressing Goal: Respiratory complications will improve Outcome: Progressing Goal: Cardiovascular complication will be avoided Outcome: Progressing   Problem: Activity: Goal: Risk for activity intolerance will decrease Outcome: Progressing   Problem: Nutrition: Goal: Adequate nutrition will be maintained Outcome: Progressing   Problem: Coping: Goal: Level of anxiety will decrease Outcome: Progressing   Problem: Elimination: Goal: Will not experience complications related to bowel motility Outcome: Progressing Goal: Will not experience complications related to urinary retention Outcome: Progressing   Problem: Pain Managment: Goal: General experience of comfort will improve Outcome: Progressing   Problem: Safety: Goal: Ability to remain free from injury will improve Outcome: Progressing   Problem: Skin Integrity: Goal: Risk for impaired skin integrity will decrease Outcome: Progressing

## 2019-11-25 NOTE — Progress Notes (Signed)
Nutrition Follow-up  DOCUMENTATION CODES:   Not applicable  INTERVENTION:   -D/c Ensure Enlive po BID, each supplement provides 350 kcal and 20 grams of protein -Magic cup TID with meals, each supplement provides 290 kcal and 9 grams of protein -MVI with minerals daily  NUTRITION DIAGNOSIS:   Increased nutrient needs related to acute illness (osteomyelitis of cervical spine) as evidenced by estimated needs.  Ongoing  GOAL:   Patient will meet greater than or equal to 90% of their needs  Progressing   MONITOR:   PO intake, Supplement acceptance, Labs, Weight trends, Skin, I & O's  REASON FOR ASSESSMENT:   Consult Assessment of nutrition requirement/status  ASSESSMENT:   Daniel Raymond is a 34 y.o. male with medical history significant of IVDA; tobacco dependence; Hep C; asthma; and osteomyelitis of the cervical spine presenting with neck pain.  Reviewed I/O's: +1.7 L x 24 hours and +9.8 L since admission  Pt resting quietly at time of visit. He did not respond to name being called.   Pt remains with good appetite; consuming 100% of meals he is refusing Ensure Enlive supplements.   Per ID notes, pt to remain inpatient for completion of IV antibiotics.   Medications reviewed and include lovenox.  Labs reviewed.   Diet Order:   Diet Order            Diet regular Room service appropriate? Yes; Fluid consistency: Thin  Diet effective now                 EDUCATION NEEDS:   No education needs have been identified at this time  Skin:  Skin Assessment: Reviewed RN Assessment  Last BM:  11/24/19  Height:   Ht Readings from Last 1 Encounters:  11/18/19 5\' 10"  (1.778 m)    Weight:   Wt Readings from Last 1 Encounters:  11/18/19 72.6 kg    Ideal Body Weight:  75.5 kg  BMI:  Body mass index is 22.97 kg/m.  Estimated Nutritional Needs:   Kcal:  2000-2200  Protein:  95-110 grams  Fluid:  > 2 L    11/20/19, RD, LDN, CDCES Registered Dietitian  II Certified Diabetes Care and Education Specialist Please refer to [facility] for RD and/or RD on-call/weekend/after hours pager

## 2019-11-25 NOTE — Plan of Care (Signed)

## 2019-11-26 LAB — GC/CHLAMYDIA PROBE AMP (~~LOC~~) NOT AT ARMC
Chlamydia: NEGATIVE
Comment: NEGATIVE
Comment: NORMAL
Neisseria Gonorrhea: NEGATIVE

## 2019-11-26 LAB — CBC WITH DIFFERENTIAL/PLATELET
Abs Immature Granulocytes: 0.01 10*3/uL (ref 0.00–0.07)
Basophils Absolute: 0 10*3/uL (ref 0.0–0.1)
Basophils Relative: 1 %
Eosinophils Absolute: 0.2 10*3/uL (ref 0.0–0.5)
Eosinophils Relative: 3 %
HCT: 31.8 % — ABNORMAL LOW (ref 39.0–52.0)
Hemoglobin: 10.1 g/dL — ABNORMAL LOW (ref 13.0–17.0)
Immature Granulocytes: 0 %
Lymphocytes Relative: 40 %
Lymphs Abs: 1.9 10*3/uL (ref 0.7–4.0)
MCH: 26.9 pg (ref 26.0–34.0)
MCHC: 31.8 g/dL (ref 30.0–36.0)
MCV: 84.8 fL (ref 80.0–100.0)
Monocytes Absolute: 0.7 10*3/uL (ref 0.1–1.0)
Monocytes Relative: 15 %
Neutro Abs: 2 10*3/uL (ref 1.7–7.7)
Neutrophils Relative %: 41 %
Platelets: 239 10*3/uL (ref 150–400)
RBC: 3.75 MIL/uL — ABNORMAL LOW (ref 4.22–5.81)
RDW: 15.7 % — ABNORMAL HIGH (ref 11.5–15.5)
WBC: 4.8 10*3/uL (ref 4.0–10.5)
nRBC: 0 % (ref 0.0–0.2)

## 2019-11-26 NOTE — Plan of Care (Signed)
  Problem: Education: Goal: Knowledge of General Education information will improve Description: Including pain rating scale, medication(s)/side effects and non-pharmacologic comfort measures Outcome: Progressing   Problem: Health Behavior/Discharge Planning: Goal: Ability to manage health-related needs will improve Outcome: Progressing   Problem: Clinical Measurements: Goal: Will remain free from infection Outcome: Progressing   Problem: Activity: Goal: Risk for activity intolerance will decrease Outcome: Progressing   Problem: Coping: Goal: Level of anxiety will decrease Outcome: Progressing   Problem: Pain Managment: Goal: General experience of comfort will improve Outcome: Progressing   

## 2019-11-26 NOTE — Progress Notes (Signed)
    [facility]   Reason for visit: Follow up on facet septic arthritis, discitis, osteomyelitis  Interval History: no acute events.  Blood cultures remained negative, final.  vancomcyin and ceftriaxone  Physical Exam: Constitutional:  Vitals:   11/25/19 2047 11/26/19 0546  BP: 108/69 106/62  Pulse: 90 77  Resp: 18 19  Temp: 97.8 F (36.6 C) 97.6 F (36.4 C)  SpO2: 99% 99%   patient appears in NAD  Impression: above infection.  Ideally he will stay here for above antibiotics for another 3 weeks or so before conversion to outpatient treatment (oritavancin + levaquin, for example).   Plan: 1.  No changes  Will follow up again next week

## 2019-11-26 NOTE — TOC Initial Note (Addendum)
Transition of Care [facility]) - Initial/Assessment Note    Patient Details  Name: Daniel Raymond MRN: [id number] Date of Birth: 08/17/1985  [facility]) CM/SW Contact:    Epifanio Lesches, RN Phone Number: 11/26/2019, 1:22 PM  Clinical Narrative:       Admitted with C-spine osteomyelitis/ IV drug abuse, hx of IVDA ( heroin); tobacco dependence; Hep C; asthma; and osteomyelitis of the cervical spine, recent  multi hospital AMA visits.   Per ID pt will need 6 weeks LT IV ABX therapy, end date per pharmacy, 12/17. Pt will complete while in hospital 2/2 drug hx.  Pt states PTA was homeless. Has supportive parents.Can't live with parents 2/2 parents raising his 68 y/o son.  Pt requesting assistance with going to a drug rehab. once d/c. CSW Tamera Punt) had provided resources and will f/u closer to the  week of d/c to help with ? landing a rehab center for pt. Pt states would like to wean off SUBOXONE prior to d/c, MD made aware by NCM.  TOC team will continue to monitor and follow for needs....  Expected Discharge Plan: Home/Self Care Barriers to Discharge: Continued Medical Work up   Patient Goals and CMS Choice        Expected Discharge Plan and Services Expected Discharge Plan: Home/Self Care       Living arrangements for the past 2 months: Homeless                                      Prior Living Arrangements/Services Living arrangements for the past 2 months: Homeless Lives with:: Self Patient language and need for interpreter reviewed:: Yes        Need for Family Participation in Patient Care: No (Comment) Care giver support system in place?: No (comment)   Criminal Activity/Legal Involvement Pertinent to Current Situation/Hospitalization: No - Comment as needed  Activities of Daily Living Home Assistive Devices/Equipment: None ADL Screening (condition at time of admission) Patient's cognitive ability adequate to safely complete daily activities?:  No Is the patient deaf or have difficulty hearing?: No Does the patient have difficulty seeing, even when wearing glasses/contacts?: No Does the patient have difficulty concentrating, remembering, or making decisions?: No Patient able to express need for assistance with ADLs?: Yes Does the patient have difficulty dressing or bathing?: No Independently performs ADLs?: Yes (appropriate for developmental age) Does the patient have difficulty walking or climbing stairs?: No Weakness of Legs: None Weakness of Arms/Hands: None  Permission Sought/Granted                  Emotional Assessment Appearance:: Appears stated age Attitude/Demeanor/Rapport: Engaged Affect (typically observed): Accepting Orientation: : Oriented to Self, Oriented to Place, Oriented to  Time, Oriented to Situation Alcohol / Substance Use: Illicit Drugs Psych Involvement: No (comment)  Admission diagnosis:  Septic arthritis (HCC) [M00.9] Septic arthritis of vertebra, due to unspecified organism [facility]) [M46.50] Patient Active Problem List   Diagnosis Date Noted  . Methamphetamine dependence, continuous (HCC) 11/21/2019  . IV drug abuse (HCC)   . Polysubstance dependence including opioid type drug with complication, episodic abuse, with unspecified complication (HCC)   . Tobacco dependence   . Hepatitis C   . Subacute osteomyelitis of cervical spine (HCC)    PCP:  Patient, No Pcp Per Pharmacy:   [facility] [address], [place] - [place] [address] [place] [address] Phone: [phone] Fax: [phone]     Social Determinants of Health (SDOH) Interventions    Readmission Risk Interventions No flowsheet data found.

## 2019-11-26 NOTE — Plan of Care (Signed)
Patient is alert and oriented x4. Self care, ambulates to the bathroom and in the room. Tylenol administered for neck pain with some relief. Remains on IV antibiotics. Problem: Education: Goal: Knowledge of General Education information will improve Description: Including pain rating scale, medication(s)/side effects and non-pharmacologic comfort measures Outcome: Progressing   Problem: Health Behavior/Discharge Planning: Goal: Ability to manage health-related needs will improve Outcome: Progressing   Problem: Clinical Measurements: Goal: Ability to maintain clinical measurements within normal limits will improve Outcome: Progressing Goal: Will remain free from infection Outcome: Progressing Goal: Diagnostic test results will improve Outcome: Progressing Goal: Respiratory complications will improve Outcome: Progressing Goal: Cardiovascular complication will be avoided Outcome: Progressing   Problem: Activity: Goal: Risk for activity intolerance will decrease Outcome: Progressing   Problem: Nutrition: Goal: Adequate nutrition will be maintained Outcome: Progressing   Problem: Coping: Goal: Level of anxiety will decrease Outcome: Progressing   Problem: Elimination: Goal: Will not experience complications related to bowel motility Outcome: Progressing Goal: Will not experience complications related to urinary retention Outcome: Progressing   Problem: Pain Managment: Goal: General experience of comfort will improve Outcome: Progressing   Problem: Safety: Goal: Ability to remain free from injury will improve Outcome: Progressing   Problem: Skin Integrity: Goal: Risk for impaired skin integrity will decrease Outcome: Progressing

## 2019-11-26 NOTE — Progress Notes (Signed)
[facility] Progress Note  Patient: Daniel Raymond    QIW:[id number]  DOA: 11/17/2019     Date of Service: the patient was seen and examined on 11/26/2019  Brief hospital course: Past medical history of IV drug abuse, tobacco abuse, hep C, asthma, chronic osteomyelitis presents with worsening neck pain. 06/2019 diagnosed with cervical osteomyelitis at [facility].  Left AMA. 9/18-26 admitted at [facility] for the same.  Treated with IV vancomycin and Rocephin.  Left AMA 9/26. 10/4 admitted at [facility].  Left AMA. 10/18-26 admitted at [facility] again.  Left AMA.  Found to be using heroin in the hospital.  Currently plan is continue IV biotics.  Assessment and Plan: Cervical spine osteomyelitis Septic arthritis Patient presenting following multiple recent hospitalizations after leaving AMA with continued neck pain. MR C-spine with abnormal signal/contrast C2--3 facet joint consistent with septic arthritis with no evidence of active discitis/osteomyelitis but with chronic osteomyelitis C6/7.  Patient is afebrile without leukocytosis.  ESR 27, CRP 0.9.  ED provider discussed case with neurosurgery PA, Kim who advised no intervention without epidural involvement and recommended medical management. --Infectious disease following, appreciate assistance --QuantiFERON gold:Negative --Blood cultures x2: no growth x 5 days --Received oritavancin on 11/18/2019  --Continue empiric antibiotics with ceftriaxone and vancomycin --Await further ID recommendations  Hepatitis C Viral load 173,697 on 10/10/2019 at [facility].  Is treatment nave due to ongoing IV drug abuse.  Per ID will follow up outpatient. Gonococcal and Chlamydia work-up negative.  Polysubstance abuse Patient with long standing history of heroin dependence, has history of leaving AMA and returning to different hospital ED's.  Recently started on  Suboxone at [facility], but was discontinued as he was using heroin inpatient.  He currently desires to quit. --Suboxone 8-71m PO BID --Hydroxyzine 25 mg q6h prn anxiety --continue to montior on COWS protocol --restrictive visitation --Patient wishes for drug rehabilitation on discharge, social work for assistance  Tobacco dependence Counseled on need for complete cessation. --Nicotine patch  Body mass index is 22.97 kg/m.  Nutrition Problem: Increased nutrient needs Etiology: acute illness (osteomyelitis of cervical spine) Interventions: Interventions: Ensure Enlive (each supplement provides 350kcal and 20 grams of protein), MVI  Diet: Regular diet DVT Prophylaxis:   enoxaparin (LOVENOX) injection 40 mg Start: 11/18/19 1000    Advance goals of care discussion: Full code  Family Communication: no family was present at bedside, at the time of interview.   Disposition:  Status is: Inpatient  Remains inpatient appropriate because:Inpatient level of care appropriate due to severity of illness   Dispo: The patient is from: Home              Anticipated d/c is to: Home              Anticipated d/c date is: > 3 days              Patient currently is not medically stable to d/c.  Subjective: Patient denies any acute complaint. No fever no chills. No chest pain. No diarrhea.  Physical Exam:  General: Appear in mild distress, no Rash; Oral Mucosa Clear, moist. no Abnormal Neck Mass Or lumps, Conjunctiva normal  Cardiovascular: S1 and S2 Present, no Murmur, Respiratory: good respiratory effort, Bilateral Air entry present and CTA, no Crackles, no wheezes Abdomen: Bowel Sound present, Soft and no tenderness Extremities: no Pedal edema Neurology: alert and oriented to time, place, and person affect appropriate. no new focal deficit Gait  not checked due to patient safety concerns  Vitals:   11/25/19 0852 11/25/19 1130 11/25/19 2047 11/26/19 0546  BP: 128/85 129/80 108/69  106/62  Pulse: 92 80 90 77  Resp: 18  18 19   Temp: (!) 97.5 F (36.4 C) 98.9 F (37.2 C) 97.8 F (36.6 C) 97.6 F (36.4 C)  TempSrc: Oral Oral Oral Oral  SpO2: 99% 100% 99% 99%  Weight:      Height:        Intake/Output Summary (Last 24 hours) at 11/26/2019 1806 Last data filed at 11/26/2019 1100 Gross per 24 hour  Intake 720 ml  Output --  Net 720 ml   Filed Weights   11/17/19 1804 11/18/19 1507  Weight: 72.6 kg 72.6 kg    Data Reviewed: I have personally reviewed and interpreted daily labs, tele strips, imagings as discussed above. I reviewed all nursing notes, pharmacy notes, vitals, pertinent old records I have discussed plan of care as described above with RN and patient/family.  CBC: Recent Labs  Lab 11/22/19 0212 11/26/19 0410  WBC 5.0 4.8  NEUTROABS  --  2.0  HGB 10.2* 10.1*  HCT 32.6* 31.8*  MCV 85.1 84.8  PLT 252 407   Basic Metabolic Panel: Recent Labs  Lab 11/22/19 0212 11/25/19 1416  NA 135 140  K 3.8 4.1  CL 102 101  CO2 24 28  GLUCOSE 104* 112*  BUN 19 15  CREATININE 0.88 0.82  CALCIUM 9.2 9.7    Studies: No results found.  Scheduled Meds: . buprenorphine-naloxone  1 tablet Sublingual BID  . enoxaparin (LOVENOX) injection  40 mg Subcutaneous Q24H  . influenza vac split quadrivalent PF  0.5 mL Intramuscular Tomorrow-1000  . multivitamin with minerals  1 tablet Oral Daily  . sodium chloride flush  10-40 mL Intracatheter Q12H   Continuous Infusions: . cefTRIAXone (ROCEPHIN)  IV 2 g (11/26/19 0538)  . vancomycin 1,500 mg (11/26/19 1607)   PRN Meds: acetaminophen **OR** acetaminophen, albuterol, hydrALAZINE, nicotine, ondansetron **OR** ondansetron (ZOFRAN) IV, sodium chloride flush  Time spent: 35 minutes  Author: Berle Mull, MD [facility] 11/26/2019 6:06 PM  To reach On-call, see care teams to locate the attending and reach out via [email or web address]. Between 7PM-7AM, please contact night-coverage If you still have  difficulty reaching the attending provider, please page the [facility] (Director on Call) for [facility] on amion for assistance.

## 2019-11-27 NOTE — Progress Notes (Signed)
[facility] Progress Note  Patient: Daniel Raymond    SVX:[id number]  DOA: 11/17/2019     Date of Service: the patient was seen and examined on 11/27/2019  Brief hospital course: Past medical history of IV drug abuse, tobacco abuse, hep C, asthma, chronic osteomyelitis presents with worsening neck pain. 06/2019 diagnosed with cervical osteomyelitis at [facility].  Left AMA. 9/18-26 admitted at [facility] for the same.  Treated with IV vancomycin and Rocephin.  Left AMA 9/26. 10/4 admitted at [facility].  Left AMA. 10/18-26 admitted at [facility] again.  Left AMA.  Found to be using heroin in the hospital. Currently plan is continue IV antibiotics.  Assessment and Plan: Cervical spine osteomyelitis Septic arthritis Patient presenting following multiple recent hospitalizations after leaving AMA with continued neck pain. MR C-spine with abnormal signal/contrast C2--3 facet joint consistent with septic arthritis with no evidence of active discitis/osteomyelitis but with chronic osteomyelitis C6/7.  Patient is afebrile without leukocytosis.  ESR 27, CRP 0.9.  ED provider discussed case with neurosurgery PA, Kim who advised no intervention without epidural involvement and recommended medical management. --Infectious disease following, appreciate assistance --QuantiFERON gold:Negative --Blood cultures x2: no growth x 5 days --Received oritavancin on 11/18/2019  --Continue empiric antibiotics with ceftriaxone and vancomycin at least for 3 more weeks, likely till the end of November before considering to transition to outpatient regimen.  Hepatitis C Viral load 173,697 on 10/10/2019 at [facility].  Is treatment nave due to ongoing IV drug abuse.  Per ID will follow up outpatient. Gonococcal and Chlamydia work-up negative.  Polysubstance abuse Patient with long standing history of heroin dependence, has history of  leaving AMA and returning to different hospital ED's.  Recently started on Suboxone at [facility], but was discontinued as he was using heroin inpatient.  He currently desires to quit. --Suboxone 8-57m PO BID, patient does not want to continue Suboxone on discharge will initiate taper when appropriate. --Hydroxyzine 25 mg q6h prn anxiety --continue to montior on COWS protocol --restrictive visitation  Tobacco dependence Counseled on need for complete cessation. --Nicotine patch  Body mass index is 22.97 kg/m.  Nutrition Problem: Increased nutrient needs Etiology: acute illness (osteomyelitis of cervical spine) Interventions: Interventions: Ensure Enlive (each supplement provides 350kcal and 20 grams of protein), MVI  Diet: Regular diet DVT Prophylaxis:   enoxaparin (LOVENOX) injection 40 mg Start: 11/18/19 1000    Advance goals of care discussion: Full code  Family Communication: no family was present at bedside, at the time of interview.   Disposition:  Status is: Inpatient  Remains inpatient appropriate because:Inpatient level of care appropriate due to severity of illness   Dispo: The patient is from: Home              Anticipated d/c is to: Home              Anticipated d/c date is: > 3 days              Patient currently is not medically stable to d/c.  Subjective: Pain well controlled.  No nausea no vomiting.  Physical Exam:  General: Appear in mild distress, no Rash; Oral Mucosa Clear, moist. no Abnormal Neck Mass Or lumps, Conjunctiva normal  Cardiovascular: S1 and S2 Present, no Murmur, Respiratory: good respiratory effort, Bilateral Air entry present and CTA, no Crackles, no wheezes Abdomen: Bowel Sound present, Soft and no tenderness Extremities: no Pedal edema Neurology: alert and oriented to  time, place, and person affect appropriate. no new focal deficit Gait not checked due to patient safety concerns  Vitals:   11/26/19 0546 11/26/19 1700  11/26/19 1916 11/27/19 0709  BP: 106/62 109/70 111/66 (!) 89/55  Pulse: 77 78 86 90  Resp: 19 18 18 18   Temp: 97.6 F (36.4 C) 98 F (36.7 C) 99 F (37.2 C) 98.8 F (37.1 C)  TempSrc: Oral Oral Oral Oral  SpO2: 99% 99% 100% 99%  Weight:      Height:        Intake/Output Summary (Last 24 hours) at 11/27/2019 1534 Last data filed at 11/27/2019 0900 Gross per 24 hour  Intake 360 ml  Output --  Net 360 ml   Filed Weights   11/17/19 1804 11/18/19 1507  Weight: 72.6 kg 72.6 kg    Data Reviewed: I have personally reviewed and interpreted daily labs, tele strips, imagings as discussed above. I reviewed all nursing notes, pharmacy notes, vitals, pertinent old records I have discussed plan of care as described above with RN and patient/family.  CBC: Recent Labs  Lab 11/22/19 0212 11/26/19 0410  WBC 5.0 4.8  NEUTROABS  --  2.0  HGB 10.2* 10.1*  HCT 32.6* 31.8*  MCV 85.1 84.8  PLT 252 876   Basic Metabolic Panel: Recent Labs  Lab 11/22/19 0212 11/25/19 1416  NA 135 140  K 3.8 4.1  CL 102 101  CO2 24 28  GLUCOSE 104* 112*  BUN 19 15  CREATININE 0.88 0.82  CALCIUM 9.2 9.7    Studies: No results found.  Scheduled Meds: . buprenorphine-naloxone  1 tablet Sublingual BID  . enoxaparin (LOVENOX) injection  40 mg Subcutaneous Q24H  . influenza vac split quadrivalent PF  0.5 mL Intramuscular Tomorrow-1000  . multivitamin with minerals  1 tablet Oral Daily  . sodium chloride flush  10-40 mL Intracatheter Q12H   Continuous Infusions: . cefTRIAXone (ROCEPHIN)  IV 2 g (11/27/19 0659)  . vancomycin 1,500 mg (11/27/19 0434)   PRN Meds: acetaminophen **OR** acetaminophen, albuterol, hydrALAZINE, nicotine, ondansetron **OR** ondansetron (ZOFRAN) IV, sodium chloride flush  Time spent: 35 minutes  Author: Berle Mull, MD [facility] 11/27/2019 3:34 PM  To reach On-call, see care teams to locate the attending and reach out via [email or web address]. Between 7PM-7AM,  please contact night-coverage If you still have difficulty reaching the attending provider, please page the [facility] (Director on Call) for [facility] on amion for assistance.

## 2019-11-27 NOTE — Progress Notes (Signed)
Pharmacy Antibiotic Note  Daniel Raymond is a 34 y.o. male admitted on 11/17/2019 with discitis, left outside hospital AMA.  Pharmacy has been consulted for vancomycin dosing.  BCx from [facility] 10/18 ngtd, therapeutic lvls on vancomycin 1500mg  IV every 12 hours with last dose charted 10/26 AM before pt left AMA.  Also was on ceftriaxone 2g q24h, per MD. Last Scr 11/4 stable at ~0.8. Vancomycin troughs 10/30 and 11/3 both within goal range of 15-20 mcg/mL.  BMet ordered MWF - Scr stable. Continue current vancomycin dosing for now. Will continue monitoring Scr and recheck trough weekly if needed. Pt plans to stay for his entire treatment course here 6-8 weeks depending on improvement.    Plan: Continue vancomycin 1500 mg IV every 12 hours Vancomycin trough as indicated Monitor renal function   Height: 5\' 10"  (177.8 cm) Weight: 72.6 kg (160 lb 0.9 oz) IBW/kg (Calculated) : 73  Temp (24hrs), Avg:98.6 F (37 C), Min:98 F (36.7 C), Max:99 F (37.2 C)  Recent Labs  Lab 11/20/19 1548 11/22/19 0212 11/24/19 0434 11/25/19 1416 11/26/19 0410  WBC  --  5.0  --   --  4.8  CREATININE  --  0.88  --  0.82  --   VANCOTROUGH 15  --  17  --   --     Estimated Creatinine Clearance: 130.3 mL/min (by C-G formula based on SCr of 0.82 mg/dL).    Allergies  Allergen Reactions  . Ibuprofen Other (See Comments)    Stomach pains    13/04/21, PharmD PGY2 ID Pharmacy Resident Phone between 7 am - 3:30 pm: 13/05/21  Please check AMION for all [facility] Pharmacy phone numbers After 10:00 PM, call Main Pharmacy [phone]  11/27/2019 9:07 AM

## 2019-11-27 NOTE — Plan of Care (Signed)
  Problem: Education: Goal: Knowledge of General Education information will improve Description: Including pain rating scale, medication(s)/side effects and non-pharmacologic comfort measures Outcome: Progressing   Problem: Health Behavior/Discharge Planning: Goal: Ability to manage health-related needs will improve Outcome: Progressing   Problem: Clinical Measurements: Goal: Will remain free from infection Outcome: Progressing   Problem: Activity: Goal: Risk for activity intolerance will decrease Outcome: Progressing   Problem: Nutrition: Goal: Adequate nutrition will be maintained Outcome: Progressing   Problem: Pain Managment: Goal: General experience of comfort will improve Outcome: Progressing   

## 2019-11-28 ENCOUNTER — Inpatient Hospital Stay: Payer: Self-pay

## 2019-11-28 NOTE — Progress Notes (Signed)
[facility] Progress Note  Patient: Daniel Raymond    XVQ:[id number]  DOA: 11/17/2019     Date of Service: the patient was seen and examined on 11/28/2019  Brief hospital course: Past medical history of IV drug abuse, tobacco abuse, hep C, asthma, chronic osteomyelitis presents with worsening neck pain. 06/2019 diagnosed with cervical osteomyelitis at [facility].  Left AMA. 9/18-26 admitted at [facility] for the same.  Treated with IV vancomycin and Rocephin.  Left AMA 9/26. 10/4 admitted at [facility].  Left AMA. 10/18-26 admitted at [facility] again.  Left AMA.  Found to be using heroin in the hospital. Currently plan is continue IV antibiotics.   Assessment and Plan: Cervical spine osteomyelitis Septic arthritis Patient presenting following multiple recent hospitalizations after leaving AMA with continued neck pain. MR C-spine with abnormal signal/contrast C2--3 facet joint consistent with septic arthritis with no evidence of active discitis/osteomyelitis but with chronic osteomyelitis C6/7.  Patient is afebrile without leukocytosis.  ESR 27, CRP 0.9.  ED provider discussed case with neurosurgery PA, Kim who advised no intervention without epidural involvement and recommended medical management. QuantiFERON gold:Negative Blood cultures x2: no growth x 5 days  --Infectious disease following, appreciate assistance On 11/26/2019 ID mentioned"  Ideally he will stay here for above antibiotics for another 3 weeks or so before conversion to outpatient treatment (oritavancin + levaquin, for example). "  Hepatitis C Viral load 173,697 on 10/10/2019 at [facility].  Is treatment nave due to ongoing IV drug abuse.  HIV, Gonococcal and Chlamydia work-up negative. Per ID will follow up outpatient.  Polysubstance abuse Patient with long standing history of heroin dependence, has history of leaving AMA and returning  to different hospital ED's.  Recently started on Suboxone at [facility], but was discontinued as he was using heroin inpatient.  He currently desires to quit. --Suboxone 8-40m PO BID, patient does not want to continue Suboxone on discharge will initiate taper when appropriate. --Hydroxyzine 25 mg q6h prn anxiety --continue to montior on COWS protocol --restrictive visitation  Tobacco dependence Counseled on need for complete cessation. --Nicotine patch  Limited IV access. Upper lip abrasion patient has experienced during this hospital stay. PICC line ordered.  Body mass index is 22.97 kg/m.  Nutrition Problem: Increased nutrient needs Etiology: acute illness (osteomyelitis of cervical spine) Interventions: Interventions: Ensure Enlive (each supplement provides 350kcal and 20 grams of protein), MVI  Diet: Regular diet DVT Prophylaxis:   enoxaparin (LOVENOX) injection 40 mg Start: 11/18/19 1000  Advance goals of care discussion: Full code  Family Communication: no family was present at bedside, at the time of interview.   Disposition:  Status is: Inpatient  Remains inpatient appropriate because:Inpatient level of care appropriate due to severity of illness   Dispo: The patient is from: Home              Anticipated d/c is to: Home              Anticipated d/c date is: > 3 days              Patient currently is not medically stable to d/c.  Subjective: No acute complaint.  Pain well controlled.  No fever no chills.  Physical Exam:  General: Appear in mild distress, no Rash; Oral Mucosa Clear, moist. no Abnormal Neck Mass Or lumps, Conjunctiva normal  Cardiovascular: S1 and S2 Present, no Murmur, Respiratory: good respiratory effort, Bilateral Air entry present and CTA,  no Crackles, no wheezes Abdomen: Bowel Sound present, Soft and no tenderness Extremities: no Pedal edema Neurology: alert and oriented to time, place, and person affect appropriate. no new focal  deficit Gait not checked due to patient safety concerns  Vitals:   11/27/19 1553 11/27/19 1921 11/28/19 0540 11/28/19 1239  BP: 115/70 103/65 113/81 112/73  Pulse: 90 79 83 89  Resp: 14 19 16 18   Temp: 97.9 F (36.6 C) 97.7 F (36.5 C) 98.4 F (36.9 C) 98.1 F (36.7 C)  TempSrc: Oral Oral Oral Oral  SpO2: 99% 99% 97% 99%  Weight:      Height:        Intake/Output Summary (Last 24 hours) at 11/28/2019 1511 Last data filed at 11/28/2019 1300 Gross per 24 hour  Intake 240 ml  Output --  Net 240 ml   Filed Weights   11/17/19 1804 11/18/19 1507  Weight: 72.6 kg 72.6 kg    Data Reviewed: I have personally reviewed and interpreted daily labs, tele strips, imagings as discussed above. I reviewed all nursing notes, pharmacy notes, vitals, pertinent old records I have discussed plan of care as described above with RN and patient/family.  CBC: Recent Labs  Lab 11/22/19 0212 11/26/19 0410  WBC 5.0 4.8  NEUTROABS  --  2.0  HGB 10.2* 10.1*  HCT 32.6* 31.8*  MCV 85.1 84.8  PLT 252 401   Basic Metabolic Panel: Recent Labs  Lab 11/22/19 0212 11/25/19 1416  NA 135 140  K 3.8 4.1  CL 102 101  CO2 24 28  GLUCOSE 104* 112*  BUN 19 15  CREATININE 0.88 0.82  CALCIUM 9.2 9.7    Studies: [place] EKG SITE RITE  Result Date: 11/28/2019 If Site Rite image not attached, placement could not be confirmed due to current cardiac rhythm.   Scheduled Meds: . buprenorphine-naloxone  1 tablet Sublingual BID  . enoxaparin (LOVENOX) injection  40 mg Subcutaneous Q24H  . influenza vac split quadrivalent PF  0.5 mL Intramuscular Tomorrow-1000  . multivitamin with minerals  1 tablet Oral Daily  . sodium chloride flush  10-40 mL Intracatheter Q12H   Continuous Infusions: . cefTRIAXone (ROCEPHIN)  IV 2 g (11/28/19 0705)  . vancomycin 1,500 mg (11/28/19 0445)   PRN Meds: acetaminophen **OR** acetaminophen, albuterol, hydrALAZINE, nicotine, ondansetron **OR** ondansetron (ZOFRAN) IV, sodium  chloride flush  Time spent: 35 minutes  Author: Berle Mull, MD [facility] 11/28/2019 3:11 PM  To reach On-call, see care teams to locate the attending and reach out via [email or web address]. Between 7PM-7AM, please contact night-coverage If you still have difficulty reaching the attending provider, please page the [facility] (Director on Call) for [facility] on amion for assistance.

## 2019-11-28 NOTE — Progress Notes (Signed)
PICC order received this pm.  Secure chat with Dr Leodis Binet and Synetta Fail RN.  PICC placement concerning due to recent hx of IVDA and leaving hospital AMA per chart.  Upon entering pt room and introducing self as IV Team, pt stated that usually needed a ultrasound and a PICC line.  Able to visualize veins without a tourniquet and requested to assess without the ultrasound.  Explained to pt if [place] was needed, a PICC would be placed. Pt agreed with plan.  Minimal scarring/tracks noted BUE, when asked pt where he injected, he stated his neck and that is why he has infection there (multiple tracks noted on neck).  No swelling or redness either UE and minimal bruising noted.  States RAFA was tender from most recent PIV (no redness present at area).  22G PIV placed in large vein LAFA in single attempt without [place].  Pt states he was able to taste the saline and saw good blood return at site.  Pt asked to have site wrapped with guaze.  Synetta Fail RN and Dr Leodis Binet notified of assessment and interventions.  New order to cancel PICC placement.

## 2019-11-28 NOTE — Plan of Care (Signed)
  Problem: Education: Goal: Knowledge of General Education information will improve Description: Including pain rating scale, medication(s)/side effects and non-pharmacologic comfort measures Outcome: Progressing   Problem: Health Behavior/Discharge Planning: Goal: Ability to manage health-related needs will improve Outcome: Progressing   Problem: Clinical Measurements: Goal: Ability to maintain clinical measurements within normal limits will improve Outcome: Progressing   Problem: Coping: Goal: Level of anxiety will decrease Outcome: Progressing   Problem: Elimination: Goal: Will not experience complications related to bowel motility Outcome: Progressing   Problem: Pain Managment: Goal: General experience of comfort will improve Outcome: Progressing   

## 2019-11-29 LAB — CBC WITH DIFFERENTIAL/PLATELET
Abs Immature Granulocytes: 0.01 10*3/uL (ref 0.00–0.07)
Basophils Absolute: 0 10*3/uL (ref 0.0–0.1)
Basophils Relative: 1 %
Eosinophils Absolute: 0.2 10*3/uL (ref 0.0–0.5)
Eosinophils Relative: 4 %
HCT: 31.9 % — ABNORMAL LOW (ref 39.0–52.0)
Hemoglobin: 10.2 g/dL — ABNORMAL LOW (ref 13.0–17.0)
Immature Granulocytes: 0 %
Lymphocytes Relative: 41 %
Lymphs Abs: 1.7 10*3/uL (ref 0.7–4.0)
MCH: 27.5 pg (ref 26.0–34.0)
MCHC: 32 g/dL (ref 30.0–36.0)
MCV: 86 fL (ref 80.0–100.0)
Monocytes Absolute: 0.5 10*3/uL (ref 0.1–1.0)
Monocytes Relative: 12 %
Neutro Abs: 1.8 10*3/uL (ref 1.7–7.7)
Neutrophils Relative %: 42 %
Platelets: 213 10*3/uL (ref 150–400)
RBC: 3.71 MIL/uL — ABNORMAL LOW (ref 4.22–5.81)
RDW: 15.8 % — ABNORMAL HIGH (ref 11.5–15.5)
WBC: 4.2 10*3/uL (ref 4.0–10.5)
nRBC: 0 % (ref 0.0–0.2)

## 2019-11-29 LAB — BASIC METABOLIC PANEL
Anion gap: 9 (ref 5–15)
BUN: 13 mg/dL (ref 6–20)
CO2: 27 mmol/L (ref 22–32)
Calcium: 9.5 mg/dL (ref 8.9–10.3)
Chloride: 100 mmol/L (ref 98–111)
Creatinine, Ser: 0.75 mg/dL (ref 0.61–1.24)
GFR, Estimated: >60 mL/min (ref >60)
Glucose, Bld: 101 mg/dL — ABNORMAL HIGH (ref 70–99)
Potassium: 3.9 mmol/L (ref 3.5–5.1)
Sodium: 136 mmol/L (ref 135–145)

## 2019-11-29 LAB — C-REACTIVE PROTEIN: CRP: 1 mg/dL — ABNORMAL HIGH (ref <1.0)

## 2019-11-29 LAB — SEDIMENTATION RATE: Sed Rate: 12 mm/hr (ref 0–16)

## 2019-11-29 MED ORDER — METHOCARBAMOL 500 MG PO TABS: 500.0000 mg | ORAL_TABLET | Freq: Three times a day (TID) | ORAL | Status: DC

## 2019-11-29 MED ORDER — SODIUM CHLORIDE 0.9% FLUSH: 10.0000 mL | INTRAVENOUS | Status: DC | PRN

## 2019-11-29 MED ORDER — LIDOCAINE 5 % EX PTCH
1.0000 | MEDICATED_PATCH | CUTANEOUS | Status: DC
  Administered 2019-11-29 – 2019-12-13 (×13): 1 via TRANSDERMAL
  Filled 2019-11-29 (×13): qty 1

## 2019-11-29 MED ORDER — KETOROLAC TROMETHAMINE 15 MG/ML IJ SOLN
15.0000 mg | Freq: Three times a day (TID) | INTRAMUSCULAR | Status: DC
  Administered 2019-11-29 – 2019-12-02 (×9): 15 mg via INTRAVENOUS
  Filled 2019-11-29 (×9): qty 1

## 2019-11-29 MED ORDER — SODIUM CHLORIDE 0.9% FLUSH
10.0000 mL | Freq: Two times a day (BID) | INTRAVENOUS | Status: DC
  Administered 2019-11-30 – 2019-12-12 (×18): 10 mL

## 2019-11-29 MED ORDER — METHOCARBAMOL 500 MG PO TABS
500.0000 mg | ORAL_TABLET | Freq: Three times a day (TID) | ORAL | Status: DC
  Administered 2019-11-29 – 2019-11-30 (×4): 500 mg via ORAL
  Filled 2019-11-29 (×4): qty 1

## 2019-11-29 NOTE — Plan of Care (Signed)
  Problem: Education: Goal: Knowledge of General Education information will improve Description: Including pain rating scale, medication(s)/side effects and non-pharmacologic comfort measures Outcome: Progressing   Problem: Activity: Goal: Risk for activity intolerance will decrease Outcome: Progressing   Problem: Nutrition: Goal: Adequate nutrition will be maintained Outcome: Progressing   Problem: Pain Managment: Goal: General experience of comfort will improve Outcome: Progressing   Problem: Safety: Goal: Ability to remain free from injury will improve Outcome: Progressing   Problem: Education: Goal: Knowledge of General Education information will improve Description: Including pain rating scale, medication(s)/side effects and non-pharmacologic comfort measures Outcome: Progressing   Problem: Activity: Goal: Risk for activity intolerance will decrease Outcome: Progressing   Problem: Nutrition: Goal: Adequate nutrition will be maintained Outcome: Progressing   Problem: Pain Managment: Goal: General experience of comfort will improve Outcome: Progressing   Problem: Safety: Goal: Ability to remain free from injury will improve Outcome: Progressing

## 2019-11-29 NOTE — Progress Notes (Signed)
[facility] Progress Note  Patient: Daniel Raymond    ZOX:[id number]  DOA: 11/17/2019     Date of Service: the patient was seen and examined on 11/29/2019  Brief hospital course: Past medical history of IV drug abuse, tobacco abuse, hep C, asthma, chronic osteomyelitis presents with worsening neck pain. 06/2019 diagnosed with cervical osteomyelitis at [facility].  Left AMA. 9/18-26 admitted at [facility] for the same.  Treated with IV vancomycin and Rocephin.  Left AMA 9/26. 10/4 admitted at [facility].  Left AMA. 10/18-26 admitted at [facility] again.  Left AMA.  Found to be using heroin in the hospital. Currently plan is continue IV antibiotics.   Assessment and Plan: Cervical spine osteomyelitis Septic arthritis Patient presenting following multiple recent hospitalizations after leaving AMA with continued neck pain. MR C-spine with abnormal signal/contrast C2--3 facet joint consistent with septic arthritis with no evidence of active discitis/osteomyelitis but with chronic osteomyelitis C6/7.  Patient is afebrile without leukocytosis.  ESR 27, CRP 0.9.  ED provider discussed case with neurosurgery PA, Kim who advised no intervention without epidural involvement and recommended medical management. QuantiFERON gold:Negative Blood cultures x2: no growth x 5 days  --Infectious disease following, appreciate assistance On 11/26/2019 ID mentioned"  Ideally he will stay here for above antibiotics for another 3 weeks or so before conversion to outpatient treatment (oritavancin + levaquin, for example). "  Hepatitis C Viral load 173,697 on 10/10/2019 at [facility].  Is treatment nave due to ongoing IV drug abuse.  HIV, Gonococcal and Chlamydia work-up negative. Per ID will follow up outpatient.  Polysubstance abuse Patient with long standing history of heroin dependence, has history of leaving AMA and returning  to different hospital ED's.  Recently started on Suboxone at [facility], but was discontinued as he was using heroin inpatient.  He currently desires to quit. --Suboxone 8-85m PO BID, patient does not want to continue Suboxone on discharge will initiate taper when appropriate. --Hydroxyzine 25 mg q6h prn anxiety --continue to montior on COWS protocol --restrictive visitation  Tobacco dependence Counseled on need for complete cessation. --Nicotine patch  Limited IV access. patient has experienced during this hospital stay. PICC line ordered.  Currently PICC line team as well as bedside nursing feels peripheral IVs adequate for now.  Pain control. Currently on Suboxone. Patient does not want to continue it on discharge therefore will taper it off prior to discharge. Schedule Robaxin for today change to as needed tomorrow. As needed Toradol. Lidocaine patch.  Body mass index is 22.97 kg/m.  Nutrition Problem: Increased nutrient needs Etiology: acute illness (osteomyelitis of cervical spine) Interventions: Interventions: Ensure Enlive (each supplement provides 350kcal and 20 grams of protein), MVI  Diet: Regular diet DVT Prophylaxis:   enoxaparin (LOVENOX) injection 40 mg Start: 11/18/19 1000  Advance goals of care discussion: Full code  Family Communication: no family was present at bedside, at the time of interview.   Disposition:  Status is: Inpatient  Remains inpatient appropriate because:Inpatient level of care appropriate due to severity of illness   Dispo: The patient is from: Home              Anticipated d/c is to: Home              Anticipated d/c date is: > 3 days              Patient currently is not medically stable to d/c.  Subjective: Reports neck pain.  No nausea no vomiting.  No fever no chills.  No diarrhea.  Physical Exam:  General: Appear in mild distress, no Rash; Oral Mucosa Clear, moist. no Abnormal Neck Mass Or lumps, Conjunctiva normal   Cardiovascular: S1 and S2 Present, no Murmur, Respiratory: good respiratory effort, Bilateral Air entry present and CTA, no Crackles, no wheezes Abdomen: Bowel Sound present, Soft and no tenderness Extremities: no Pedal edema Neurology: alert and oriented to time, place, and person affect appropriate. no new focal deficit Gait not checked due to patient safety concerns  Vitals:   11/28/19 1900 11/29/19 0300 11/29/19 0812 11/29/19 1356  BP: 118/73 121/70 111/78 (!) 99/58  Pulse: 77 80 87 79  Resp: _0 Temp: 97.9 F (36.6 C) 98 F (36.7 C) (!) 97.3 F (36.3 C) 98.7 F (37.1 C)  TempSrc: Oral Oral Oral Oral  SpO2: 100% 100% 100% 96%  Weight:      Height:        Intake/Output Summary (Last 24 hours) at 11/29/2019 1816 Last data filed at 11/29/2019 0813 Gross per 24 hour  Intake 360 ml  Output --  Net 360 ml   Filed Weights   11/17/19 1804 11/18/19 1507  Weight: 72.6 kg 72.6 kg    Data Reviewed: I have personally reviewed and interpreted daily labs, tele strips, imagings as discussed above. I reviewed all nursing notes, pharmacy notes, vitals, pertinent old records I have discussed plan of care as described above with RN and patient/family.  CBC: Recent Labs  Lab 11/26/19 0410 11/29/19 0514  WBC 4.8 4.2  NEUTROABS 2.0 1.8  HGB 10.1* 10.2*  HCT 31.8* 31.9*  MCV 84.8 86.0  PLT 239 122   Basic Metabolic Panel: Recent Labs  Lab 11/25/19 1416 11/29/19 0514  NA 140 136  K 4.1 3.9  CL 101 100  CO2 28 27  GLUCOSE 112* 101*  BUN 15 13  CREATININE 0.82 0.75  CALCIUM 9.7 9.5    Studies: No results found.  Scheduled Meds: . buprenorphine-naloxone  1 tablet Sublingual BID  . enoxaparin (LOVENOX) injection  40 mg Subcutaneous Q24H  . influenza vac split quadrivalent PF  0.5 mL Intramuscular Tomorrow-1000  . ketorolac  15 mg Intravenous Q8H  . lidocaine  1 patch Transdermal Q24H  . methocarbamol  500 mg Oral TID  . multivitamin with minerals  1 tablet  Oral Daily  . sodium chloride flush  10-40 mL Intracatheter Q12H  . sodium chloride flush  10-40 mL Intracatheter Q12H   Continuous Infusions: . cefTRIAXone (ROCEPHIN)  IV Stopped (11/29/19 1000)  . vancomycin 1,500 mg (11/29/19 1631)   PRN Meds: acetaminophen **OR** acetaminophen, albuterol, hydrALAZINE, nicotine, ondansetron **OR** ondansetron (ZOFRAN) IV, sodium chloride flush, sodium chloride flush  Time spent: 35 minutes  Author: Berle Mull, MD [facility] 11/29/2019 6:16 PM  To reach On-call, see care teams to locate the attending and reach out via [email or web address]. Between 7PM-7AM, please contact night-coverage If you still have difficulty reaching the attending provider, please page the [facility] (Director on Call) for [facility] on amion for assistance.

## 2019-11-29 NOTE — Progress Notes (Addendum)
I have seen and examined the patient. I have personally reviewed the clinical findings, laboratory findings, microbiological data and imaging studies. The assessment and treatment plan was discussed with the  Advance Practice Provider, Janene Madeira I agree with her/his documentation except following additions/corrections.  C2-C3 facet septic arthritis/osteomyelitis with no positive cultures. No surgical intervention done. He is on empiric treatment with Vancomycin and ceftriaxone. He will need it for a total of 6 weeks preferably in an inpatient setting or in a supervised setting like a SNF. Monitor CBC and CMP, Vanc trough while on IV abx. Weekly CRP and ESR. Complains of neck pain/stiffness. However, His strength in Bilateral Upper extremities is 5/5. Denies any numbness/weakness/tingling in the upper extremities. Restricted ROM of neck. Will need good control of his neck pain/stiffness. Will need treatment for HCV as an Outpatient eventually.  [name], MD [name]  Date of Admission:  11/17/2019      Total days of antibiotics 12  Vancomycin + ceftriaxone            ASSESSMENT: Daniel Raymond is a 34 y.o. 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   He feels as if his pain has been worsening over the last few days. D/W Dr. Posey Pronto and will try adding Ketorolac IV for adjunctive relief. If he has persistent pain would plan to get an xray of the neck to ensure no acute bony changes. If any upper extremity neurologic changes would urgently get MRI and have Neurosurgery see him. Will continue plan for prolonged empiric IV therapy with Vancomycin + Ceftriaxone. ESR / CRP remain pretty flat and not acutely elevated (09 >> 1.3>> 1.0). ESR normalized (27 >> 26 >> 12).   Serum Cr remains at baseline - follow  for changes with starting on Ketorolac.   Hepatitis C RNA + 173,967 at [facility] on 9/19 --> outpatient treatment if he is interested will be offered.     PLAN: 1. Continue Ceftriaxone + Vancomycin  2. Follow creatinine and vanc troughs. 3. Dr. Posey Pronto to add Ketorolac IV    Principal Problem:   Subacute osteomyelitis of cervical spine (HCC) Active Problems:   IV drug abuse (HCC)   Polysubstance dependence including opioid type drug with complication, episodic abuse, with unspecified complication ([place])   Tobacco dependence   Hepatitis C   Methamphetamine dependence, continuous ([place])   . buprenorphine-naloxone  1 tablet Sublingual BID  . enoxaparin (LOVENOX) injection  40 mg Subcutaneous Q24H  . influenza vac split quadrivalent PF  0.5 mL Intramuscular Tomorrow-1000  . ketorolac  15 mg Intravenous Q8H  . lidocaine  1 patch Transdermal Q24H  . methocarbamol  500 mg Oral TID  . multivitamin with minerals  1 tablet Oral Daily  . sodium chloride flush  10-40 mL Intracatheter Q12H  . sodium chloride flush  10-40 mL Intracatheter Q12H    SUBJECTIVE: Feels like the neck pain has gotten worse over the last few days. Not taking much for pain as he is worried about taking too much tylenol with history of hepatitis C.  Otherwise just on Suboxone. Had a shot of ketorolac and muscle relaxers earlier that did seem to help.  No fevers / chills.  Feels like "his neck bones just are not stable."  More difficult time with left lateral rotation.  No changes  to UE sensation, grip/strength.    Review of Systems: Review of Systems  Constitutional: Negative for chills and fever.  HENT: Negative for tinnitus.   Eyes: Negative for blurred vision and photophobia.  Respiratory: Negative for cough and sputum production.   Cardiovascular: Negative for chest pain.  Gastrointestinal: Negative for diarrhea, nausea and vomiting.  Genitourinary: Negative for dysuria.  Musculoskeletal: Positive for neck  pain.  Skin: Negative for rash.  Neurological: Negative for headaches.  Psychiatric/Behavioral: Positive for substance abuse.    Allergies  Allergen Reactions  . Ibuprofen Other (See Comments)    Stomach pains    OBJECTIVE: Vitals:   11/28/19 1239 11/28/19 1900 11/29/19 0300 11/29/19 0812  BP: 112/73 118/73 121/70 111/78  Pulse: 89 77 80 87  Resp: 18 18 17 18   Temp: 98.1 F (36.7 C) 97.9 F (36.6 C) 98 F (36.7 C) (!) 97.3 F (36.3 C)  TempSrc: Oral Oral Oral Oral  SpO2: 99% 100% 100% 100%  Weight:      Height:       Body mass index is 22.97 kg/m.  Physical Exam Constitutional:      Appearance: Normal appearance. He is not ill-appearing.     Comments: Observed walking around in the room getting cleaned up.   HENT:     Mouth/Throat:     Mouth: Mucous membranes are moist.     Pharynx: No oropharyngeal exudate.  Eyes:     General: No scleral icterus. Cardiovascular:     Rate and Rhythm: Normal rate and regular rhythm.     Pulses: Normal pulses.     Heart sounds: Normal heart sounds.  Pulmonary:     Effort: Pulmonary effort is normal.     Breath sounds: Normal breath sounds.  Abdominal:     General: Bowel sounds are normal. There is no distension.     Palpations: Abdomen is soft. There is no fluid wave, hepatomegaly or splenomegaly.     Tenderness: There is no abdominal tenderness.  Musculoskeletal:        General: No swelling.     Cervical back: Pain with movement and muscular tenderness present. Decreased range of motion.     Right lower leg: No edema.     Left lower leg: No edema.  Lymphadenopathy:     Cervical: No cervical adenopathy.  Skin:    Capillary Refill: Capillary refill takes less than 2 seconds.     Coloration: Skin is not jaundiced.     Findings: No rash.  Neurological:     General: No focal deficit present.     Mental Status: He is alert and oriented to person, place, and time.     Sensory: No sensory deficit.     Coordination:  Coordination normal.  Psychiatric:        Behavior: Behavior normal.        Judgment: Judgment normal.     Lab Results Lab Results  Component Value Date   WBC 4.2 11/29/2019   HGB 10.2 (L) 11/29/2019   HCT 31.9 (L) 11/29/2019   MCV 86.0 11/29/2019   PLT 213 11/29/2019    Lab Results  Component Value Date   CREATININE 0.75 11/29/2019   BUN 13 11/29/2019   NA 136 11/29/2019   K 3.9 11/29/2019   CL 100 11/29/2019   CO2 27 11/29/2019    Lab Results  Component Value Date   ALT 44 11/22/2019   AST 29 11/22/2019   ALKPHOS 70 11/22/2019   BILITOT 0.5  11/22/2019     Microbiology: No results found for this or any previous visit (from the past 240 hour(s)).   Janene Madeira, MSN, NP-C [facility] for Infectious Disease [email or web address] Pager: [phone] Office: [phone] [place]: [phone]

## 2019-11-30 LAB — VANCOMYCIN, TROUGH: Vancomycin Tr: 13 ug/mL — ABNORMAL LOW (ref 15–20)

## 2019-11-30 MED ORDER — METHOCARBAMOL 500 MG PO TABS: 500.0000 mg | ORAL_TABLET | Freq: Three times a day (TID) | ORAL | Status: DC | PRN

## 2019-11-30 NOTE — Plan of Care (Signed)
  Problem: Education: Goal: Knowledge of General Education information will improve Description: Including pain rating scale, medication(s)/side effects and non-pharmacologic comfort measures Outcome: Progressing   Problem: Health Behavior/Discharge Planning: Goal: Ability to manage health-related needs will improve Outcome: Progressing   Problem: Activity: Goal: Risk for activity intolerance will decrease Outcome: Progressing   Problem: Nutrition: Goal: Adequate nutrition will be maintained Outcome: Progressing   Problem: Pain Managment: Goal: General experience of comfort will improve Outcome: Progressing   Problem: Skin Integrity: Goal: Risk for impaired skin integrity will decrease Outcome: Progressing   

## 2019-11-30 NOTE — Progress Notes (Signed)
Pharmacy Antibiotic Note  Daniel Raymond is a 34 y.o. male admitted on 11/17/2019 with discitis, left outside hospital AMA.  Pharmacy has been consulted for vancomycin dosing.  BCx from [facility] 10/18 ngtd, therapeutic lvls on vancomycin 1500mg  IV every 12 hours with last dose charted 10/26 AM before pt left AMA.  Also was on ceftriaxone 2g q24h, per MD. Last Scr 11/8 stable at 0.75. Vancomycin troughs 10/30 and 11/3 both within goal range of 15-20 mcg/mL.  BMet ordered MWF - Scr stable. VT this AM drawn appropriately came back at 13, slightly below goal of 15. Continue current vancomycin dosing for now. Will continue monitoring Scr and recheck trough weekly if needed. Starting Ketorolac for pain in the neck, watch Scr closely. Pt plans to stay for his entire treatment course here 6-8 weeks depending on improvement.    Plan: Continue vancomycin 1500 mg IV every 12 hours Vancomycin trough as indicated Monitor renal function   Height: 5\' 10"  (177.8 cm) Weight: 72.6 kg (160 lb 0.9 oz) IBW/kg (Calculated) : 73  Temp (24hrs), Avg:97.8 F (36.6 C), Min:97.3 F (36.3 C), Max:98.7 F (37.1 C)  Recent Labs  Lab 11/24/19 0434 11/25/19 1416 11/26/19 0410 11/29/19 0514 11/30/19 0437  WBC  --   --  4.8 4.2  --   CREATININE  --  0.82  --  0.75  --   VANCOTROUGH 17  --   --   --  13*    Estimated Creatinine Clearance: 133.6 mL/min (by C-G formula based on SCr of 0.75 mg/dL).    Allergies  Allergen Reactions  . Ibuprofen Other (See Comments)    Stomach pains    13/08/21, PharmD Infectious Disease Pharmacist  Phone: [phone]  Please check AMION for all [facility] Pharmacy phone numbers After 10:00 PM, call Main Pharmacy [phone]  11/30/2019 8:07 AM

## 2019-11-30 NOTE — Progress Notes (Signed)
[facility] Progress Note  Patient: Daniel Raymond    BZJ:[id number]  DOA: 11/17/2019     Date of Service: the patient was seen and examined on 11/30/2019  Brief hospital course: Past medical history of IV drug abuse, tobacco abuse, hep C, asthma, chronic osteomyelitis presents with worsening neck pain. 06/2019 diagnosed with cervical osteomyelitis at [facility]  Left AMA. 9/18-26 admitted at [facility] for the same.  Treated with IV vancomycin and Rocephin.  Left AMA 9/26. 10/4 admitted at [facility].  Left AMA. 10/18-26 admitted at [facility] again.  Left AMA.  Found to be using heroin in the hospital. Currently plan is continue IV antibiotics.   Assessment and Plan: Cervical spine osteomyelitis Septic arthritis Patient presenting following multiple recent hospitalizations after leaving AMA with continued neck pain. MR C-spine with abnormal signal/contrast C2--3 facet joint consistent with septic arthritis with no evidence of active discitis/osteomyelitis but with chronic osteomyelitis C6/7.  Patient is afebrile without leukocytosis.  ESR 27, CRP 0.9.  ED provider discussed case with neurosurgery PA, Kim who advised no intervention without epidural involvement and recommended medical management. QuantiFERON gold:Negative Blood cultures x2: no growth x 5 days  --Infectious disease following, appreciate assistance On 11/26/2019 ID mentioned"  Ideally he will stay here for above antibiotics for another 3 weeks or so before conversion to outpatient treatment (oritavancin + levaquin, for example). "  Hepatitis C Viral load 173,697 on 10/10/2019 at [facility].  Is treatment nave due to ongoing IV drug abuse.  HIV, Gonococcal and Chlamydia work-up negative. Per ID will follow up outpatient.  Polysubstance abuse Patient with long standing history of heroin dependence, has history of leaving AMA and returning  to different hospital ED's.  Recently started on Suboxone at [facility], but was discontinued as he was using heroin inpatient.  He currently desires to quit. --Suboxone 8-29m PO BID, patient does not want to continue Suboxone on discharge will initiate taper when appropriate. --Hydroxyzine 25 mg q6h prn anxiety --continue to montior on COWS protocol --restrictive visitation  Tobacco dependence Counseled on need for complete cessation. --Nicotine patch  Limited IV access. patient has experienced during this hospital stay. PICC line ordered.  Currently PICC line team as well as bedside nursing feels peripheral IVs adequate for now.  Pain control. Currently on Suboxone. Patient does not want to continue it on discharge therefore will taper it off prior to discharge. Continue Robaxin. As needed Toradol. Lidocaine patch.  Body mass index is 22.97 kg/m.  Nutrition Problem: Increased nutrient needs Etiology: acute illness (osteomyelitis of cervical spine) Interventions: Interventions: Ensure Enlive (each supplement provides 350kcal and 20 grams of protein), MVI  Diet: Regular diet DVT Prophylaxis:   enoxaparin (LOVENOX) injection 40 mg Start: 11/18/19 1000  Advance goals of care discussion: Full code  Family Communication: no family was present at bedside, at the time of interview.   Disposition:  Status is: Inpatient  Remains inpatient appropriate because:Inpatient level of care appropriate due to severity of illness  Dispo: The patient is from: Home              Anticipated d/c is to: Home              Anticipated d/c date is: > 3 days              Patient currently is not medically stable to d/c.  Subjective: Neck pain improving after scheduled Robaxin.  No nausea no vomiting.  No fever no chills.  Physical Exam:  General: Appear in mild distress, no Rash; Oral Mucosa Clear, moist. no Abnormal Neck Mass Or lumps, Conjunctiva normal  Cardiovascular: S1 and S2  Present, no Murmur, Respiratory: good respiratory effort, Bilateral Air entry present and CTA, no Crackles, no wheezes Abdomen: Bowel Sound present, Soft and no tenderness Extremities: no Pedal edema Neurology: alert and oriented to time, place, and person affect appropriate. no new focal deficit Gait not checked due to patient safety concerns   Vitals:   11/29/19 2110 11/30/19 0619 11/30/19 0739 11/30/19 1241  BP: 100/61 (!) 100/58 (!) 103/53 (!) 107/58  Pulse: 72 71 82 80  Resp: _0 Temp: 97.7 F (36.5 C) 97.9 F (36.6 C) (!) 97.4 F (36.3 C) 97.6 F (36.4 C)  TempSrc: Oral Oral Oral Oral  SpO2: 99% 98% 100% 99%  Weight:      Height:       No intake or output data in the 24 hours ending 11/30/19 1620 Filed Weights   11/17/19 1804 11/18/19 1507  Weight: 72.6 kg 72.6 kg    Data Reviewed: I have personally reviewed and interpreted daily labs, tele strips, imagings as discussed above. I reviewed all nursing notes, pharmacy notes, vitals, pertinent old records I have discussed plan of care as described above with RN and patient/family.  CBC: Recent Labs  Lab 11/26/19 0410 11/29/19 0514  WBC 4.8 4.2  NEUTROABS 2.0 1.8  HGB 10.1* 10.2*  HCT 31.8* 31.9*  MCV 84.8 86.0  PLT 239 443   Basic Metabolic Panel: Recent Labs  Lab 11/25/19 1416 11/29/19 0514  NA 140 136  K 4.1 3.9  CL 101 100  CO2 28 27  GLUCOSE 112* 101*  BUN 15 13  CREATININE 0.82 0.75  CALCIUM 9.7 9.5    Studies: No results found.  Scheduled Meds: . buprenorphine-naloxone  1 tablet Sublingual BID  . enoxaparin (LOVENOX) injection  40 mg Subcutaneous Q24H  . influenza vac split quadrivalent PF  0.5 mL Intramuscular Tomorrow-1000  . ketorolac  15 mg Intravenous Q8H  . lidocaine  1 patch Transdermal Q24H  . methocarbamol  500 mg Oral TID  . multivitamin with minerals  1 tablet Oral Daily  . sodium chloride flush  10-40 mL Intracatheter Q12H  . sodium chloride flush  10-40 mL  Intracatheter Q12H   Continuous Infusions: . cefTRIAXone (ROCEPHIN)  IV 2 g (11/30/19 0513)  . vancomycin 1,500 mg (11/30/19 0559)   PRN Meds: acetaminophen **OR** acetaminophen, albuterol, hydrALAZINE, nicotine, ondansetron **OR** ondansetron (ZOFRAN) IV, sodium chloride flush, sodium chloride flush  Time spent: 35 minutes  Author: Berle Mull, MD [facility] 11/30/2019 4:20 PM  To reach On-call, see care teams to locate the attending and reach out via [email or web address]. Between 7PM-7AM, please contact night-coverage If you still have difficulty reaching the attending provider, please page the [facility] (Director on Call) for [facility] on amion for assistance.

## 2019-11-30 NOTE — Plan of Care (Signed)
  Problem: Education: Goal: Knowledge of General Education information will improve Description: Including pain rating scale, medication(s)/side effects and non-pharmacologic comfort measures Outcome: Progressing   Problem: Health Behavior/Discharge Planning: Goal: Ability to manage health-related needs will improve Outcome: Progressing   Problem: Activity: Goal: Risk for activity intolerance will decrease Outcome: Progressing   Problem: Elimination: Goal: Will not experience complications related to bowel motility Outcome: Progressing   Problem: Pain Managment: Goal: General experience of comfort will improve Outcome: Progressing   Problem: Safety: Goal: Ability to remain free from injury will improve Outcome: Progressing   Problem: Skin Integrity: Goal: Risk for impaired skin integrity will decrease Outcome: Progressing   

## 2019-11-30 NOTE — Plan of Care (Signed)

## 2019-12-01 LAB — BASIC METABOLIC PANEL
Anion gap: 7 (ref 5–15)
BUN: 18 mg/dL (ref 6–20)
CO2: 27 mmol/L (ref 22–32)
Calcium: 9.4 mg/dL (ref 8.9–10.3)
Chloride: 103 mmol/L (ref 98–111)
Creatinine, Ser: 0.82 mg/dL (ref 0.61–1.24)
GFR, Estimated: >60 mL/min (ref >60)
Glucose, Bld: 106 mg/dL — ABNORMAL HIGH (ref 70–99)
Potassium: 3.9 mmol/L (ref 3.5–5.1)
Sodium: 137 mmol/L (ref 135–145)

## 2019-12-01 NOTE — Progress Notes (Addendum)
I have seen and examined the patient. I have personally reviewed the clinical findings, laboratory findings, microbiological data and imaging studies. The assessment and treatment plan was discussed with the  Advance Practice Provider, Daniel Raymond.  I agree with her/his assessment and recommendations  except following additions/corrections.  Patient seen and examined. Neck pain and stiffness has been same. No changes in neurological exam. Afebrile, no leukocytosis. Labs stable. Continue Vancomycin and ceftriaxone as is. From care everywhere, it seems patient was started on vanc/ceftriaxone from 10/17 until 10/26 when he left AMA and came here on 10/27 with no prolonged interruption in IV abx. Tentative end date would be 11/28. Will need to monitor CBC, CMP, Vanc trough, ESR and CRP while on IV abx. Will need supervised setting for IV abx given h/o IVDU.   Follow up at [facility] for HCV treatment   Please call us back with questions or concerns.   Daniel Oz, MD [place] [facility] [place] [facility]  Date of Admission:  11/17/2019      Total days of antibiotics 13 (22 days total including [facility] tx)  Vancomycin + ceftriaxone            ASSESSMENT: Daniel Raymond is a 34 y.o. 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   No changes with regards to his neurologic exam and seems better controlled with ketorolac PRN and antispasmodics on board.   Serum Cr remains at baseline - follow for changes with starting on Ketorolac. No changes to current plan. Given chronicity and high level vertebral involvement would prefer he continue full 6 weeks of IV therapy to treat this infection for him. I think we can count the 7 days of therapy he had a [facility] prior to admission here considering there was no significant interruption in therapy.   Will  schedule a follow up with ID outpatient the following week to follow up and consider treatment / evaluation for chronic hepatitis C.  Hepatitis C RNA + 173,967. Genotype and fibrosis unknown. Will draw labs while here.     PLAN: 1. Continue Ceftriaxone + Vancomycin  2. Follow creatinine twice weekly and vanc troughs. 3. CBC with diff, CRP and ESR weekly 4. FU appointment made 12/08 at 1:45 pm with Dr. [place] Bali  Please call back if any changes in his condition    Principal Problem:   Subacute osteomyelitis of cervical spine ([place]) Active Problems:   IV drug abuse ([place])   Polysubstance dependence including opioid type drug with complication, episodic abuse, with unspecified complication ([place])   Tobacco dependence   Hepatitis C   Methamphetamine dependence, continuous ([place])   . buprenorphine-naloxone  1 tablet Sublingual BID  . enoxaparin (LOVENOX) injection  40 mg Subcutaneous Q24H  . influenza vac split quadrivalent PF  0.5 mL Intramuscular Tomorrow-1000  . ketorolac  15 mg Intravenous Q8H  . lidocaine  1 patch Transdermal Q24H  . multivitamin with minerals  1 tablet Oral Daily  . sodium chloride flush  10-40 mL Intracatheter Q12H  . sodium chloride flush  10-40 mL Intracatheter Q12H    SUBJECTIVE: Feels like the neck pain may be somewhat improved. Function about the same.    Review of Systems: Review of Systems  Constitutional: Negative for chills and fever.  HENT: Negative for tinnitus.   Eyes: Negative for blurred vision and photophobia.  Respiratory: Negative  for cough and sputum production.   Cardiovascular: Negative for chest pain.  Gastrointestinal: Negative for diarrhea, nausea and vomiting.  Genitourinary: Negative for dysuria.  Musculoskeletal: Positive for neck pain.  Skin: Negative for rash.  Neurological: Negative for headaches.  Psychiatric/Behavioral: Positive for substance abuse.    Allergies  Allergen Reactions  . Ibuprofen Other (See Comments)     Stomach pains    OBJECTIVE: Vitals:   11/30/19 1241 11/30/19 2004 12/01/19 0300 12/01/19 0736  BP: (!) 107/58 122/71 103/64 108/62  Pulse: 80 82 64 65  Resp: _0 Temp: 97.6 F (36.4 C) 98.3 F (36.8 C) 98.7 F (37.1 C) 98.2 F (36.8 C)  TempSrc: Oral Oral Oral Oral  SpO2: 99% 100% 100% 100%  Weight:      Height:       Body mass index is 22.97 kg/m.  Physical Exam Constitutional:      Appearance: Normal appearance. He is not ill-appearing.     Comments: Resting quietly in bed in a dark room   HENT:     Mouth/Throat:     Mouth: Mucous membranes are moist.     Pharynx: No oropharyngeal exudate.  Eyes:     General: No scleral icterus. Cardiovascular:     Rate and Rhythm: Normal rate and regular rhythm.     Pulses: Normal pulses.     Heart sounds: Normal heart sounds.  Pulmonary:     Effort: Pulmonary effort is normal.     Breath sounds: Normal breath sounds.  Abdominal:     General: Bowel sounds are normal. There is no distension.     Palpations: Abdomen is soft. There is no fluid wave, hepatomegaly or splenomegaly.     Tenderness: There is no abdominal tenderness.  Musculoskeletal:        General: No swelling.     Cervical back: Pain with movement and muscular tenderness present. Decreased range of motion.     Right lower leg: No edema.     Left lower leg: No edema.  Lymphadenopathy:     Cervical: No cervical adenopathy.  Skin:    Capillary Refill: Capillary refill takes less than 2 seconds.     Coloration: Skin is not jaundiced.     Findings: No rash.  Neurological:     General: No focal deficit present.     Mental Status: He is alert and oriented to person, place, and time.     Sensory: No sensory deficit.     Coordination: Coordination normal.  Psychiatric:        Behavior: Behavior normal.        Judgment: Judgment normal.     Lab Results Lab Results  Component Value Date   WBC 4.2 11/29/2019   HGB 10.2 (L) 11/29/2019   HCT 31.9 (L)  11/29/2019   MCV 86.0 11/29/2019   PLT 213 11/29/2019    Lab Results  Component Value Date   CREATININE 0.82 12/01/2019   BUN 18 12/01/2019   NA 137 12/01/2019   K 3.9 12/01/2019   CL 103 12/01/2019   CO2 27 12/01/2019    Lab Results  Component Value Date   ALT 44 11/22/2019   AST 29 11/22/2019   ALKPHOS 70 11/22/2019   BILITOT 0.5 11/22/2019     Microbiology: No results found for this or any previous visit (from the past 240 hour(s)).   Daniel Madeira, MSN, NP-C [facility] [email or web address] Pager:  [phone] Office: [place]: [phone]

## 2019-12-01 NOTE — Progress Notes (Addendum)
[facility] PROGRESS NOTE  Daniel Raymond YQM:[id number] DOB: 05/18/1985 DOA: 11/17/2019 PCP: Patient, No Pcp Per  ADDENDUM: I have seen and examined this patient myself. Vital signs are stable, as is BMP. The patient is resting comfortably without new complaints. He is awake, alert, and oriented x 3. No acute distress. Heart and lung sounds are within normal limits. Abdomen is soft, non-tender, non-distended. Normal bowel sounds. Extremities are negative for cyanosis, clubbing, or edema.  I have read ARNP Ellis's note and I have discussed the patient in detail with her. I am in agreement with her evaluation and plan.  Makyla Bye, DO  ________________________________________________________________   Status: Remains inpatient appropriate because:Unsafe d/c plan and IV treatments appropriate due to intensity of illness or inability to take PO.  Requires IV antibiotics to treat osteomyelitis.  Unable to discharge home with IV line in place due to history of IV drug abuse with heroin.  Last dose IV antibiotics due 12/23/2019   Dispo: The patient is from: Home              Anticipated d/c is to: Home              Anticipated d/c date is: > 3 days (12/23/2019)              Patient currently is not medically stable to d/c.  Unsafe discharge plan in regards to requirement for IV antibiotics and patient with known IV drug abuse history   Code Status: Full Family Communication: Patient only DVT prophylaxis: Lovenox Vaccination status: We will need to discuss with patient whether he has received Covid vaccine  Foley catheter: No  HPI: Past medical history of IV drug abuse, tobacco abuse, hep C, asthma, chronic osteomyelitis presents with worsening neck pain. 06/2019 diagnosed with cervical osteomyelitis at [facility]. Left AMA. 9/18-26 admitted at [facility] for the same. Treated with IV vancomycin and Rocephin. Left AMA 9/26. 10/4 admitted at [facility]. Left AMA. 10/18-26 admitted at [facility] again. Left AMA. Found to be using heroin in the hospital. Currently plan is continue IV antibiotics.   Subjective: Awakened from sleep.  Currently denies pain.  Discussed previous documentation that he did not wish to continue Suboxone after discharge.  When asked if he was going to start using heroin again patient stated no and after discussion he decided that he would like to continue Suboxone after discharge.  Patient is aware of several clinics in his area in [place].  Once closer to discharge we will contact clinics to obtain outpatient follow-up.  Patient aware he will only be given a 5 to 7-day of Suboxone at time of discharge  Objective: Vitals:   12/01/19 0300 12/01/19 0736  BP: 103/64 108/62  Pulse: 64 65  Resp: 16 16  Temp: 98.7 F (37.1 C) 98.2 F (36.8 C)  SpO2: 100% 100%    Intake/Output Summary (Last 24 hours) at 12/01/2019 1403 Last data filed at 12/01/2019 1000 Gross per 24 hour  Intake 600 ml  Output --  Net 600 ml   Filed Weights   11/17/19 1804 11/18/19 1507  Weight: 72.6 kg 72.6 kg    Exam:  Constitutional: NAD, calm, comfortable Respiratory: clear to auscultation bilaterally, room air normal respiratory effort.  Cardiovascular: Regular rate and rhythm, no murmurs / rubs / gallops. No extremity edema. 2+ pedal pulses.  Abdomen: no tenderness, Bowel sounds positive.  Musculoskeletal: No joint deformity upper and lower extremities.  Good ROM,  Normal muscle tone.  Neurologic: CN 2-12 grossly intact. Sensation intact, DTR normal. Strength 5/5 x all 4 extremities.  Psychiatric: Normal judgment and insight. Alert and oriented x 3. Normal mood.    Assessment/Plan: Cervical spine osteomyelitis/Septic arthritis -Presented following multiple recent hospitalizations after leaving AMA with continued neck pain.  -MR C-spine with abnormal signal/contrast C2--3 facet joint consistent  with septic arthritis with no evidence of active discitis/osteomyelitis but with chronic osteomyelitis C6/7.  -Currently afebrile without leukocytosis. ESR 27, CRP 0.9.  -ED provider discussed case with neurosurgery PA, Kim who advised no intervention without epidural involvement and recommended medical management. -Blood cultures negative -ID recommends 6 weeks of IV antibiotics (only on Rocephin and vancomycin) with projected end date 12/23/2019 -states 7 days of therapy given at [facility] facility should be included in days of therapy  Hepatitis C -Viral load 173,697 on 10/10/2019 at [facility].  -Is treatment nave due to ongoing IV drug abuse.  -HIV, Gonococcal and Chlamydia work-up negative. -Per ID will follow up outpatient.  Hepatitis C genotype and HCV FibroSure ordered by ID  Polysubstance abuse -Patient with long standing history of heroin dependence, has history of leaving AMA and returning to different hospital ED's.  -Recently started on Suboxone at [place], but was discontinued as he was using heroin inpatient.  -Currently on Suboxone 8-62m PO BID; did not wish to continue after discharge but now states he wants to continue after discharge.  [place].  Patient familiar with various clinics in his area and notes co-pay for attending --continue to montior on COWS protocol --restrictive visitation history of using heroin during previous inpatient visit at outside hospital  Tobacco dependence -Counseled on need for complete cessation. --Nicotine patch  Limited IV access. -Secondary to history of IV drug abuse -Currently has single-lumen midline catheter in place  Pain control. -Continue Suboxone as above. -Continue Robaxin, Toradol prn and lidocaine patch -Patient currently denies acute pain  Nutrition Status: Nutrition Problem: Increased nutrient  needs Etiology: acute illness (osteomyelitis of cervical spine) Signs/Symptoms: estimated needs Interventions: Ensure Enlive (each supplement provides 350kcal and 20 grams of protein), MVI Estimated body mass index is 22.97 kg/m as calculated from the following:   Height as of this encounter: 5' 10"  (1.778 m).   Weight as of this encounter: 72.6 kg.     Data Reviewed: Basic Metabolic Panel: Recent Labs  Lab 11/25/19 1416 11/29/19 0514 12/01/19 0343  NA 140 136 137  K 4.1 3.9 3.9  CL 101 100 103  CO2 28 27 27   GLUCOSE 112* 101* 106*  BUN 15 13 18   CREATININE 0.82 0.75 0.82  CALCIUM 9.7 9.5 9.4   Liver Function Tests: No results for input(s): AST, ALT, ALKPHOS, BILITOT, PROT, ALBUMIN in the last 168 hours. No results for input(s): LIPASE, AMYLASE in the last 168 hours. No results for input(s): AMMONIA in the last 168 hours. CBC: Recent Labs  Lab 11/26/19 0410 11/29/19 0514  WBC 4.8 4.2  NEUTROABS 2.0 1.8  HGB 10.1* 10.2*  HCT 31.8* 31.9*  MCV 84.8 86.0  PLT 239 213   Cardiac Enzymes: No results for input(s): CKTOTAL, CKMB, CKMBINDEX, TROPONINI in the last 168 hours. BNP (last 3 results) No results for input(s): BNP in the last 8760 hours.  ProBNP (last 3 results) No results for input(s): PROBNP in the last 8760 hours.  CBG: No results for input(s):  GLUCAP in the last 168 hours.  No results found for this or any previous visit (from the past 240 hour(s)).   Studies: No results found.  Scheduled Meds: . buprenorphine-naloxone  1 tablet Sublingual BID  . enoxaparin (LOVENOX) injection  40 mg Subcutaneous Q24H  . influenza vac split quadrivalent PF  0.5 mL Intramuscular Tomorrow-1000  . ketorolac  15 mg Intravenous Q8H  . lidocaine  1 patch Transdermal Q24H  . multivitamin with minerals  1 tablet Oral Daily  . sodium chloride flush  10-40 mL Intracatheter Q12H  . sodium chloride flush  10-40 mL Intracatheter Q12H   Continuous Infusions: . cefTRIAXone  (ROCEPHIN)  IV Stopped (12/01/19 1000)  . vancomycin 1,500 mg (12/01/19 0548)    Principal Problem:   Subacute osteomyelitis of cervical spine (HCC) Active Problems:   IV drug abuse (HCC)   Polysubstance dependence including opioid type drug with complication, episodic abuse, with unspecified complication ([place])   Tobacco dependence   Hepatitis C   Methamphetamine dependence, continuous ([place])   Consultants:  Psychiatry  Infectious disease  Procedures:  Placement of midline IV catheter  Antibiotics: Anti-infectives (From admission, onward)   Start     Dose/Rate Route Frequency Ordered Stop   11/23/19 0600  cefTRIAXone (ROCEPHIN) 2 g in sodium chloride 0.9 % 100 mL IVPB        2 g 200 mL/hr over 30 Minutes Intravenous Every 24 hours 11/22/19 1135     11/20/19 0200  vancomycin (VANCOREADY) IVPB 1500 mg/300 mL        1,500 mg 150 mL/hr over 120 Minutes Intravenous Every 12 hours 11/19/19 1511     11/18/19 1615  Oritavancin Diphosphate (ORBACTIV) 1,200 mg in dextrose 5 % IVPB        1,200 mg 333.3 mL/hr over 180 Minutes Intravenous Once 11/18/19 1518 11/19/19 1907   11/18/19 1000  cefTRIAXone (ROCEPHIN) 2 g in sodium chloride 0.9 % 100 mL IVPB  Status:  Discontinued        2 g 200 mL/hr over 30 Minutes Intravenous Every 12 hours 11/17/19 2315 11/22/19 1135   11/17/19 2330  vancomycin (VANCOREADY) IVPB 1500 mg/300 mL  Status:  Discontinued        1,500 mg 150 mL/hr over 120 Minutes Intravenous Every 12 hours 11/17/19 2315 11/19/19 1511   11/17/19 2315  cefTRIAXone (ROCEPHIN) 1 g in sodium chloride 0.9 % 100 mL IVPB  Status:  Discontinued        1 g 200 mL/hr over 30 Minutes Intravenous Every 12 hours 11/17/19 2300 11/17/19 2315        Time spent: 30 minutes    Erin Hearing ANP  [facility] Pager [phone]. If 7PM-7AM, please contact night-coverage at [email or web address] 12/01/2019, 2:03 PM  LOS: 13 days

## 2019-12-01 NOTE — Progress Notes (Signed)
Nutrition Follow-up  DOCUMENTATION CODES:   Not applicable  INTERVENTION:   -Continue MVI with minerals daily -Continue Magic cup TID with meals, each supplement provides 290 kcal and 9 grams of protein  NUTRITION DIAGNOSIS:   Increased nutrient needs related to acute illness (osteomyelitis of cervical spine) as evidenced by estimated needs.  Ongoing  GOAL:   Patient will meet greater than or equal to 90% of their needs  Progressing   MONITOR:   PO intake, Supplement acceptance, Labs, Weight trends, Skin, I & O's  REASON FOR ASSESSMENT:   Consult Assessment of nutrition requirement/status  ASSESSMENT:   Daniel Raymond is a 34 y.o. male with medical history significant of IVDA; tobacco dependence; Hep C; asthma; and osteomyelitis of the cervical spine presenting with neck pain.  Reviewed I/O's: +360 ml x 24 hours and +12.5 L since admission  Pt remains with good appetite; consuming 100% of meals he is refusing Ensure Enlive supplements.  Per ID notes, plan to continue with 6 week duration of IV antibiotics until 12/23/19.   Medications reviewed and include suboxone and toradol.   Labs reviewed.   Diet Order:   Diet Order            Diet regular Room service appropriate? Yes; Fluid consistency: Thin  Diet effective now                 EDUCATION NEEDS:   No education needs have been identified at this time  Skin:  Skin Assessment: Reviewed RN Assessment  Last BM:  11/28/19  Height:   Ht Readings from Last 1 Encounters:  11/18/19 5\' 10"  (1.778 m)    Weight:   Wt Readings from Last 1 Encounters:  11/18/19 72.6 kg    Ideal Body Weight:  75.5 kg  BMI:  Body mass index is 22.97 kg/m.  Estimated Nutritional Needs:   Kcal:  2000-2200  Protein:  95-110 grams  Fluid:  > 2 L    11/20/19, RD, LDN, CDCES Registered Dietitian II Certified Diabetes Care and Education Specialist Please refer to [facility] for RD and/or RD on-call/weekend/after hours  pager

## 2019-12-01 NOTE — Plan of Care (Signed)

## 2019-12-02 MED ORDER — METHOCARBAMOL 500 MG PO TABS
750.0000 mg | ORAL_TABLET | Freq: Four times a day (QID) | ORAL | Status: DC
  Administered 2019-12-02 – 2019-12-08 (×26): 750 mg via ORAL
  Filled 2019-12-02 (×27): qty 2

## 2019-12-02 MED ORDER — KETOROLAC TROMETHAMINE 15 MG/ML IJ SOLN
15.0000 mg | Freq: Four times a day (QID) | INTRAMUSCULAR | Status: AC
  Administered 2019-12-02 – 2019-12-07 (×19): 15 mg via INTRAVENOUS
  Filled 2019-12-02 (×19): qty 1

## 2019-12-02 NOTE — Progress Notes (Addendum)
[facility] PROGRESS NOTE  Daniel Raymond MBW:[id number] DOB: 03/05/1985 DOA: 11/17/2019 PCP: Patient, No Pcp Per   ADDENDUM: I have seen and examined this patient myself. Vital signs are stable, as is BMP. The patient is resting comfortably without new complaints. He is awake, alert, and oriented x 3. No acute distress. Heart and lung sounds are within normal limits. Abdomen is soft, non-tender, non-distended. Normal bowel sounds. Extremities are negative for cyanosis, clubbing, or edema.  I have read ARNP Ellis's note and I have discussed the patient in detail with her. I am in agreement with her evaluation and plan.  Hamp Moreland, DO _________________________________________________________________  Status: Remains inpatient appropriate because:Unsafe d/c plan and IV treatments appropriate due to intensity of illness or inability to take PO.  Requires IV antibiotics to treat osteomyelitis.  Unable to discharge home with IV line in place due to history of IV drug abuse with heroin.  Last dose IV antibiotics due 12/23/2019   Dispo: The patient is from: Home              Anticipated d/c is to: Home              Anticipated d/c date is: > 3 days (12/23/2019)              Patient currently is not medically stable to d/c.  Unsafe discharge plan in regards to requirement for IV antibiotics and patient with known IV drug abuse history   Code Status: Full Family Communication: Patient only DVT prophylaxis: Lovenox Vaccination status: We will need to discuss with patient whether he has received Covid vaccine  Foley catheter: No  HPI: Past medical history of IV drug abuse, tobacco abuse, hep C, asthma, chronic osteomyelitis presents with worsening neck pain. 06/2019 diagnosed with cervical osteomyelitis at [facility]. Left AMA. 9/18-26 admitted at [facility] for the same. Treated with IV vancomycin and Rocephin. Left AMA 9/26. 10/4 admitted at [facility]. Left AMA. 10/18-26 admitted at [facility] again. Left AMA. Found to be using heroin in the hospital. Currently plan is continue IV antibiotics.   Subjective: Awake.  Complaining of significant neck discomfort today.  Discussed discharge plans regarding actual disposition and scheduling of Suboxone clinic appointment.  Patient states that he can walk in to either [place] or [facility] to receive care at $15 per day.  Patient also will be contacting the halfway house he plans to move to after discharge to determine if he can be on Suboxone at that facility.  Objective: Vitals:   12/02/19 0500 12/02/19 0722  BP: 118/68 (!) 146/105  Pulse: 93 96  Resp: 19 17  Temp: 98.8 F (37.1 C) 98.3 F (36.8 C)  SpO2: 99% 100%    Intake/Output Summary (Last 24 hours) at 12/02/2019 1305 Last data filed at 12/02/2019 0900 Gross per 24 hour  Intake 7308.48 ml  Output --  Net 7308.48 ml   Filed Weights   11/17/19 1804 11/18/19 1507  Weight: 72.6 kg 72.6 kg    Exam:  Constitutional: NAD, calm, comfortable Respiratory: clear to auscultation bilaterally, room air normal respiratory effort.  Cardiovascular: Regular rate and rhythm, no murmurs / rubs / gallops. No extremity edema. 2+ pedal pulses.  Abdomen: no tenderness, Bowel sounds positive.  Musculoskeletal: No joint deformity upper and lower extremities. Good ROM,  Normal muscle tone.  Tender and upper back and neck consistent with muscle spasms. Neurologic: CN 2-12 grossly intact.  Sensation intact, DTR normal. Strength 5/5 x all 4 extremities.  Psychiatric: Normal judgment and insight. Alert and oriented x 3. Normal mood.    Assessment/Plan: Cervical spine osteomyelitis/Septic arthritis -Presented following multiple recent hospitalizations after leaving AMA with continued neck pain.  -MR C-spine with abnormal signal/contrast C2--3 facet joint consistent with septic  arthritis with no evidence of active discitis/osteomyelitis but with chronic osteomyelitis C6/7.  -Currently afebrile without leukocytosis. ESR 27, CRP 0.9.  -ED provider discussed case with neurosurgery PA, Kim who advised no intervention without epidural involvement and recommended medical management. -Blood cultures negative -ID recommends 6 weeks of IV antibiotics (only on Rocephin and vancomycin) with projected end date 12/23/2019 -states 7 days of therapy given at [facility] facility should be included in days of therapy -Increase in neck spasms this morning.  Will change Toradol from every 8 hours to every 6 hours and will increase Robaxin to 750 mg every 6 hours scheduled  Hepatitis C -Viral load 173,697 on 10/10/2019 at [facility].  -Is treatment nave due to ongoing IV drug abuse.  -HIV, Gonococcal and Chlamydia work-up negative. -Per ID will follow up outpatient.  Hepatitis C genotype and HCV FibroSure ordered by ID  Polysubstance abuse -Patient with long standing history of heroin dependence, has history of leaving AMA and returning to different hospital ED's.  -Recently started on Suboxone at [place], but was discontinued as he was using heroin inpatient.  -Currently on Suboxone 8-42m PO BID wishes to continue after discharge.  Patient will contact halfway house he plans to discharge to to determine if he is able to continue on Suboxone residing there.  [facility] [place] that patient is familiar with that provide Suboxone treatment for $15 per day --continue to montior on COWS protocol --restrictive visitation history of using heroin during previous inpatient visit at outside hospital  Tobacco dependence -Counseled on need for complete cessation. --Nicotine patch  Limited IV access. -Secondary to history of IV drug abuse -Currently has  single-lumen midline catheter in place  Pain control. -Continue Suboxone as above. -Continue Robaxin, Toradol prn and lidocaine patch -Patient currently denies acute pain  Nutrition Status: Nutrition Problem: Increased nutrient needs Etiology: acute illness (osteomyelitis of cervical spine) Signs/Symptoms: estimated needs Interventions: Ensure Enlive (each supplement provides 350kcal and 20 grams of protein), MVI Estimated body mass index is 22.97 kg/m as calculated from the following:   Height as of this encounter: _0  (1.778 m).   Weight as of this encounter: 72.6 kg.     Data Reviewed: Basic Metabolic Panel: Recent Labs  Lab 11/25/19 1416 11/29/19 0514 12/01/19 0343  NA 140 136 137  K 4.1 3.9 3.9  CL 101 100 103  CO2 _1 GLUCOSE 112* 101* 106*  BUN _2 CREATININE 0.82 0.75 0.82  CALCIUM 9.7 9.5 9.4   Liver Function Tests: No results for input(s): AST, ALT, ALKPHOS, BILITOT, PROT, ALBUMIN in the last 168 hours. No results for input(s): LIPASE, AMYLASE in the last 168 hours. No results for input(s): AMMONIA in the last 168 hours. CBC: Recent Labs  Lab 11/26/19 0410 11/29/19 0514  WBC 4.8 4.2  NEUTROABS 2.0 1.8  HGB 10.1* 10.2*  HCT 31.8* 31.9*  MCV 84.8 86.0  PLT 239 213   Cardiac Enzymes: No results for input(s): CKTOTAL, CKMB, CKMBINDEX, TROPONINI in the last 168 hours. BNP (last 3 results) No results  for input(s): BNP in the last 8760 hours.  ProBNP (last 3 results) No results for input(s): PROBNP in the last 8760 hours.  CBG: No results for input(s): GLUCAP in the last 168 hours.  No results found for this or any previous visit (from the past 240 hour(s)).   Studies: No results found.  Scheduled Meds: . buprenorphine-naloxone  1 tablet Sublingual BID  . enoxaparin (LOVENOX) injection  40 mg Subcutaneous Q24H  . influenza vac split quadrivalent PF  0.5 mL Intramuscular Tomorrow-1000  . ketorolac  15 mg Intravenous Q6H  .  lidocaine  1 patch Transdermal Q24H  . methocarbamol  750 mg Oral QID  . multivitamin with minerals  1 tablet Oral Daily  . sodium chloride flush  10-40 mL Intracatheter Q12H  . sodium chloride flush  10-40 mL Intracatheter Q12H   Continuous Infusions: . cefTRIAXone (ROCEPHIN)  IV Stopped (12/01/19 1000)  . vancomycin 1,500 mg (12/02/19 0504)    Principal Problem:   Subacute osteomyelitis of cervical spine (HCC) Active Problems:   IV drug abuse (HCC)   Polysubstance dependence including opioid type drug with complication, episodic abuse, with unspecified complication ([place])   Tobacco dependence   Hepatitis C   Methamphetamine dependence, continuous ([place])   Consultants:  Psychiatry  Infectious disease  Procedures:  Placement of midline IV catheter  Antibiotics: Anti-infectives (From admission, onward)   Start     Dose/Rate Route Frequency Ordered Stop   11/23/19 0600  cefTRIAXone (ROCEPHIN) 2 g in sodium chloride 0.9 % 100 mL IVPB        2 g 200 mL/hr over 30 Minutes Intravenous Every 24 hours 11/22/19 1135     11/20/19 0200  vancomycin (VANCOREADY) IVPB 1500 mg/300 mL        1,500 mg 150 mL/hr over 120 Minutes Intravenous Every 12 hours 11/19/19 1511     11/18/19 1615  Oritavancin Diphosphate (ORBACTIV) 1,200 mg in dextrose 5 % IVPB        1,200 mg 333.3 mL/hr over 180 Minutes Intravenous Once 11/18/19 1518 11/19/19 1907   11/18/19 1000  cefTRIAXone (ROCEPHIN) 2 g in sodium chloride 0.9 % 100 mL IVPB  Status:  Discontinued        2 g 200 mL/hr over 30 Minutes Intravenous Every 12 hours 11/17/19 2315 11/22/19 1135   11/17/19 2330  vancomycin (VANCOREADY) IVPB 1500 mg/300 mL  Status:  Discontinued        1,500 mg 150 mL/hr over 120 Minutes Intravenous Every 12 hours 11/17/19 2315 11/19/19 1511   11/17/19 2315  cefTRIAXone (ROCEPHIN) 1 g in sodium chloride 0.9 % 100 mL IVPB  Status:  Discontinued        1 g 200 mL/hr over 30 Minutes Intravenous Every 12 hours 11/17/19  2300 11/17/19 2315       Time spent: 20 minutes    Erin Hearing ANP  [facility] Pager [phone]. If 7PM-7AM, please contact night-coverage at [email or web address] 12/02/2019, 1:05 PM  LOS: 14 days

## 2019-12-03 ENCOUNTER — Inpatient Hospital Stay (HOSPITAL_COMMUNITY): Payer: Medicaid Other

## 2019-12-03 LAB — BASIC METABOLIC PANEL
Anion gap: 8 (ref 5–15)
BUN: 17 mg/dL (ref 6–20)
CO2: 28 mmol/L (ref 22–32)
Calcium: 10 mg/dL (ref 8.9–10.3)
Chloride: 102 mmol/L (ref 98–111)
Creatinine, Ser: 0.74 mg/dL (ref 0.61–1.24)
GFR, Estimated: >60 mL/min (ref >60)
Glucose, Bld: 83 mg/dL (ref 70–99)
Potassium: 4.1 mmol/L (ref 3.5–5.1)
Sodium: 138 mmol/L (ref 135–145)

## 2019-12-03 LAB — HEPATITIS C GENOTYPE

## 2019-12-03 LAB — HCV FIBROSURE
ALPHA 2-MACROGLOBULINS, QN: 142 mg/dL (ref 110–276)
ALT (SGPT) P5P: 99 IU/L — ABNORMAL HIGH (ref 0–55)
Apolipoprotein A-1: 119 mg/dL (ref 101–178)
Bilirubin, Total: 0.2 mg/dL (ref 0.0–1.2)
Fibrosis Score: 0.05 (ref 0.00–0.21)
GGT: 14 IU/L (ref 0–65)
Haptoglobin: 124 mg/dL (ref 17–317)
Necroinflammat Activity Score: 0.47 — ABNORMAL HIGH (ref 0.00–0.17)

## 2019-12-03 LAB — VANCOMYCIN, TROUGH: Vancomycin Tr: 13 ug/mL — ABNORMAL LOW (ref 15–20)

## 2019-12-03 IMAGING — MR MR CERVICAL SPINE WO/W CM
4 of 8 series · 16 of 48 positions shown · IV contrast (gadavist)
Comparison: Cervical spine MRI [DATE]. CT cervical spine
[DATE].

CLINICAL DATA: Cervical radiculopathy, infection suspected.
Additional history provided: Past medical history of IV drug abuse,
tobacco abuse, hepatitis-C, asthma, chronic osteomyelitis presenting
with worsening neck pain. Diagnosed with cervical osteomyelitis at
[facility]OSPITAL] [DATE] (left AMA). Treated with IV
antibiotics [DATE] three [facility]th.

EXAM:
MRI CERVICAL SPINE WITHOUT AND WITH CONTRAST
TECHNIQUE: Multiplanar and multiecho pulse sequences of the cervical spine, to
include the craniocervical junction and cervicothoracic junction,
were obtained without and with intravenous contrast.
CONTRAST:  6mL GADAVIST GADOBUTROL 1 MMOL/ML IV SOLN

[Series 2: T2 · sagittal · 3.0mm · 0.43mm/px · 4 of 16 slices shown (1 of 2)]
[im 1/16]
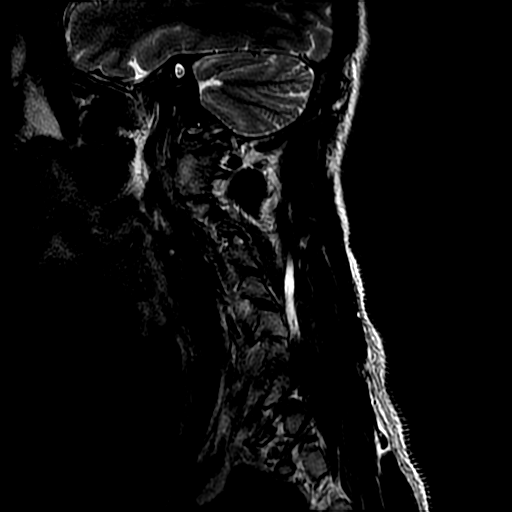
[im 6/16]
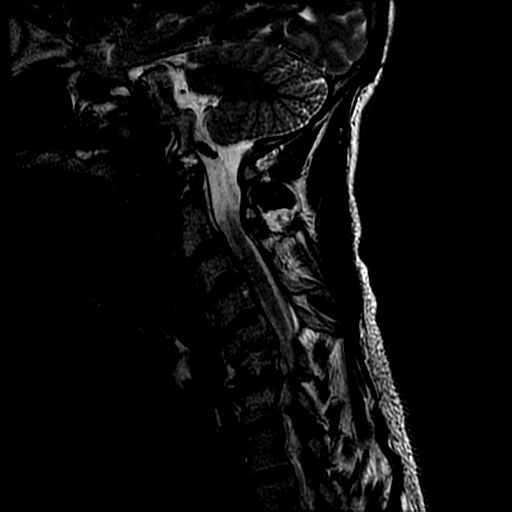
[im 11/16]
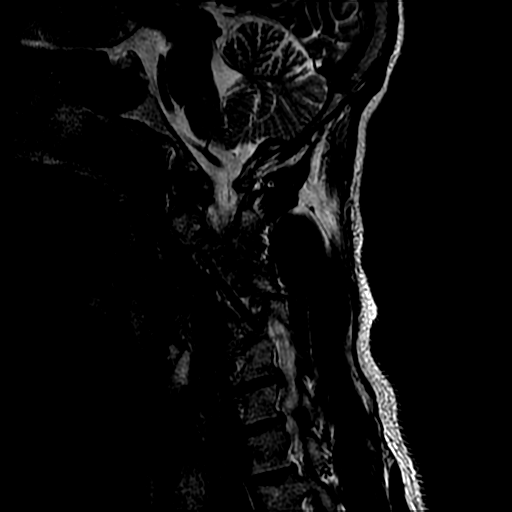
[im 16/16]
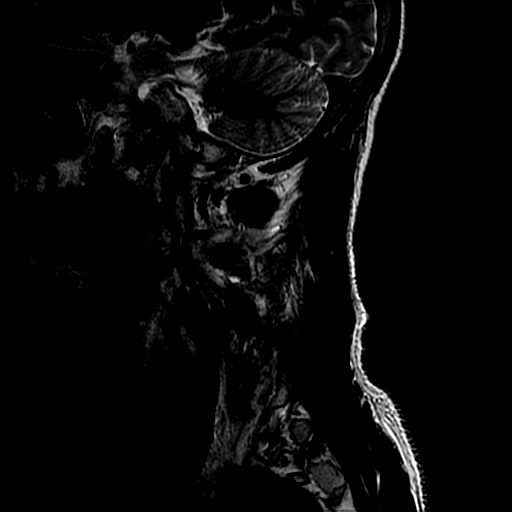

[Series 4: T1 · sagittal · 3.0mm · 0.43mm/px · 3 of 16 slices shown (1 of 2)]
[im 1/16]
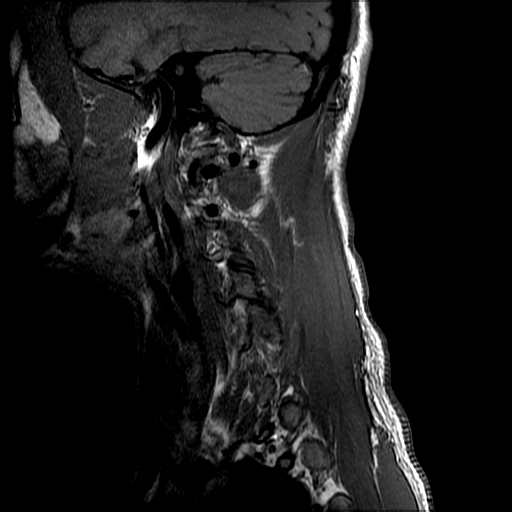
[im 11/16]
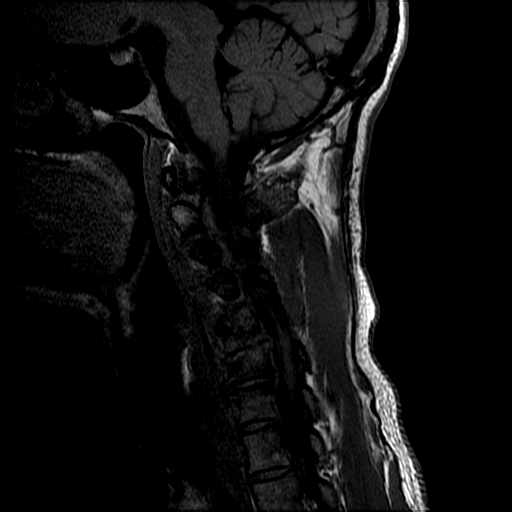
[im 16/16]
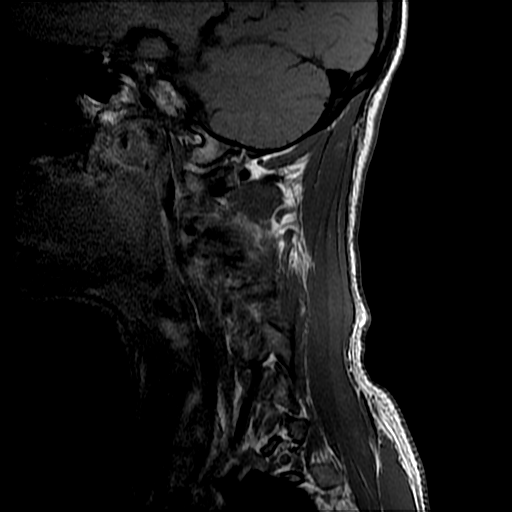

[Series 6: T2 · axial · 3.0mm · 0.39mm/px · z∈[-33,+43]mm · 6 of 30 slices shown (2 of 2)]
[im 1/30]
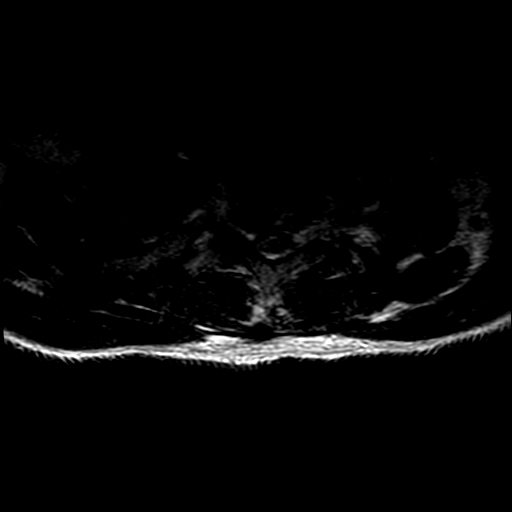
[im 5/30]
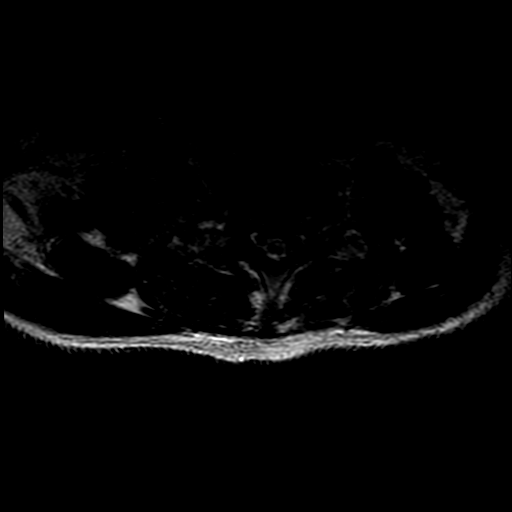
[im 9/30]
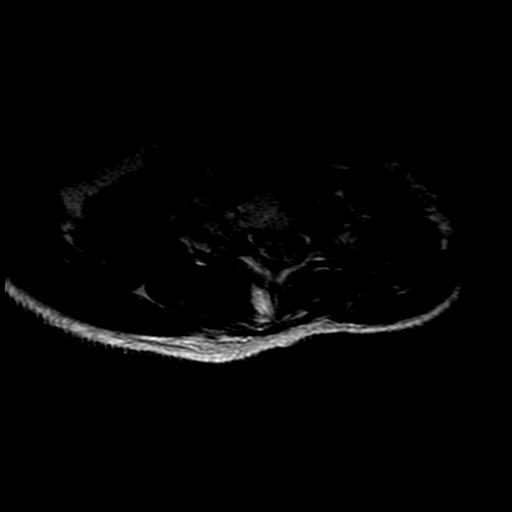
[im 13/30]
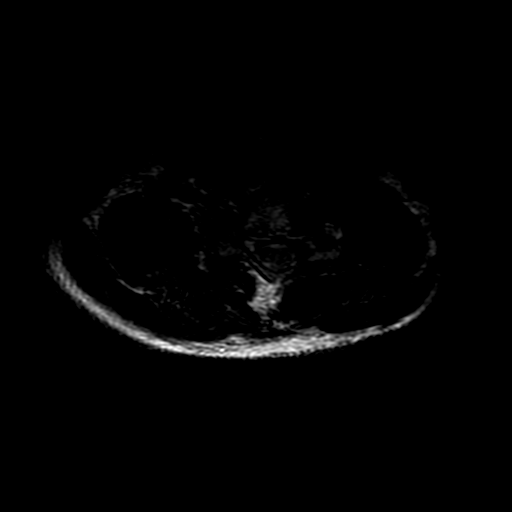
[im 17/30]
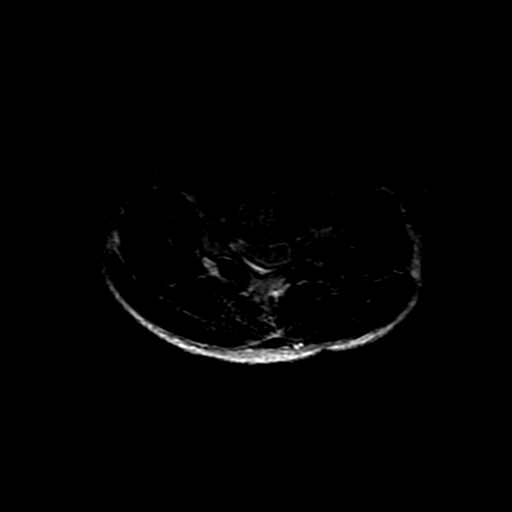
[im 25/30]
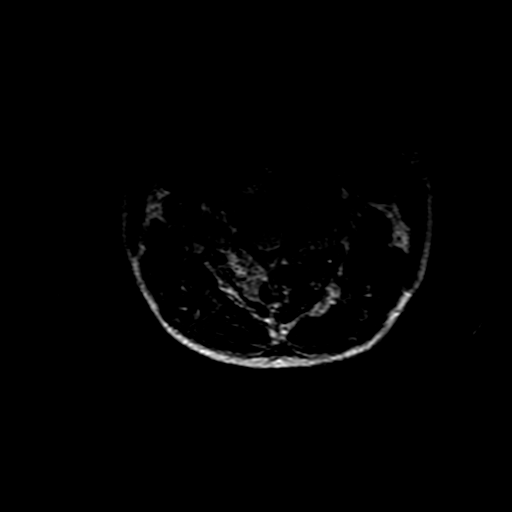

[Series 7: T1 · axial · non-contrast · 3.0mm · 0.39mm/px · z∈[-20,+43]mm · 3 of 30 slices shown (2 of 2)]
[im 5/30]
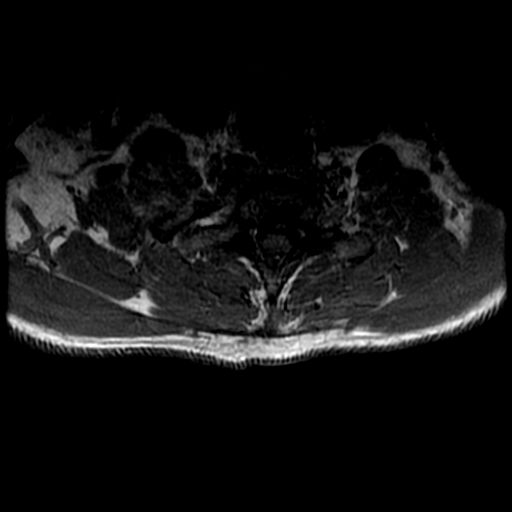
[im 17/30]
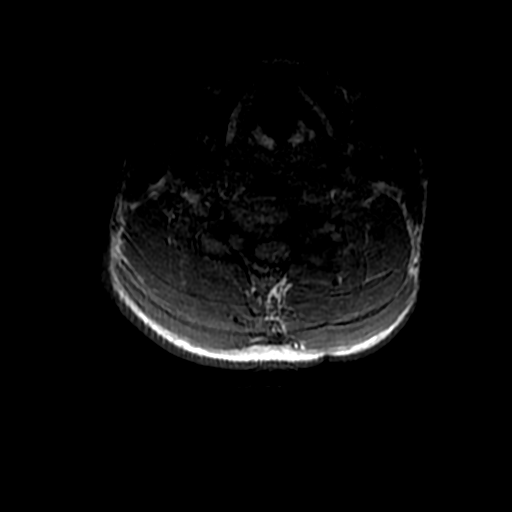
[im 25/30]
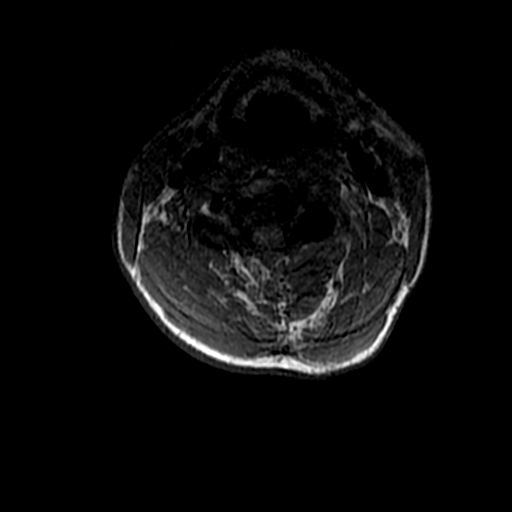

[16 of 48 positions shown; findings below may reference images not displayed]

FINDINGS: Mild-to-moderate intermittent motion degradation.

Alignment: Prominent reversal of the expected cervical lordosis
centered at C5-C6. Unchanged unchanged 3 mm C2-C3 grade 1
anterolisthesis. Unchanged trace C3-C4 grade 1 anterolisthesis.
Unchanged C6-C7 grade 1 retrolisthesis.

Vertebrae: As compared to the prior MRI of [DATE], there is
persistent although decreased edema signal and enhancement within
the C2 spinous process, within the left C2 pedicle and articular
pillar, within the C3 spinous process and within the right C3
articular pillar. Persistent abnormal enhancement within and
surrounding the left C2-C3 facet joint. Persistent although
decreased edema signal and enhancement along the endplates at C6-C7.
Unchanged mild chronic C5 vertebral body height loss anteriorly.
Vertebral body height is otherwise maintained. Multilevel
degenerative endplate irregularity greatest at C5-C6. Redemonstrated
bridging ventral osteophyte at C5-C6.

Cord: No spinal cord signal abnormality. Spinal cord flattening at
the C5-C6 level as described below.

Posterior Fossa, vertebral arteries, paraspinal tissues: No
abnormality identified within included portions of the posterior
fossa. Flow voids preserved within the imaged cervical vertebral
arteries. Unchanged T2/STIR hyperintense signal abnormality within
the interspinous space at the C5-C6 level which is nonspecific
(series 3, image 7).

Disc levels:

Unless otherwise stated, the level by level findings below have not
significantly changed since prior MRI [DATE].

C2-C3: Grade 1 anterolisthesis. Disc uncovering. Left greater than
right uncovertebral hypertrophy. Facet hypertrophy (greater on the
left). Ligamentum flavum hypertrophy. No significant spinal canal
stenosis. Bilateral neural foraminal narrowing (mild right, moderate
left).

C3-C4: Disc bulge. Uncovertebral and facet hypertrophy. No
significant spinal canal stenosis. Bilateral neural foraminal
narrowing (mild right, moderate left).

C4-C5: Facet hypertrophy. No significant disc herniation or
stenosis.

C5-C6: Cervical kyphosis centered at this level. Posterior disc
osteophyte complex. Uncovertebral hypertrophy. The posterior disc
osteophyte complex contributes to moderate spinal canal stenosis,
contacting and flattening the ventral spinal cord. No spinal cord
signal abnormality is identified. Moderate right neural foraminal
narrowing.

C6-C7: Posterior disc osteophyte complex. Uncovertebral and facet
hypertrophy. Mild spinal canal stenosis. Bilateral neural foraminal
narrowing (severe right, moderate left).

C7-T1: Disc bulge. Facet and ligamentum flavum hypertrophy. Mild
relative spinal canal narrowing. Moderate right neural foraminal
narrowing.

These results will be called to the ordering clinician or
representative by the Radiologist Assistant, and communication
documented in the PACS or [REDACTED].
IMPRESSION: Comparison is made to the prior cervical spine MRI of [DATE].

Persistent although decreased edema signal and enhancement within
the C2 and C3 posterior elements. Persistent abnormal enhancement
within and surrounding the left C2-C3 facet joint. Findings are
compatible with sequela of osteomyelitis and left C2-C3 facet joint
septic arthritis.

Persistent although decreased edema signal and enhancement along the
endplates at C6-C7. Findings may be degenerative in etiology or may
reflect an additional site of osteomyelitis.

Cervical spondylosis as outlined and unchanged from the MRI of
[DATE]. Most notably at C5-C6, a cervical kyphosis and posterior
disc osteophyte complex contribute to moderate spinal canal
stenosis. The disc osteophyte complex contacts and flattens the
ventral spinal cord.

Unchanged signal abnormality within the C5-C6 interspinous space,
nonspecific but possibly reflecting chronic interspinous ligament
strain/injury.

## 2019-12-03 MED ORDER — GADOBUTROL 1 MMOL/ML IV SOLN
6.0000 mL | Freq: Once | INTRAVENOUS | Status: AC | PRN
  Administered 2019-12-03: 6 mL via INTRAVENOUS

## 2019-12-03 NOTE — Progress Notes (Addendum)
Pharmacy Antibiotic Note  Daniel Raymond is a 34 y.o. male admitted on 11/17/2019 with discitis, left outside hospital AMA.  Pharmacy has been consulted for vancomycin dosing.  BCx from [facility] 10/18 ngtd, therapeutic lvls on vancomycin 1500mg  IV every 12 hours with last dose charted 10/26 AM before pt left AMA.  Also was on ceftriaxone 2g q24h, per MD.  BMET ordered MWF - Scr stable. Previous VT on this AM drawn on 11/9 resulted slightly below goal at 13 and current dose continued for now. Next vancomycin trough due this afternoon.  Continuing Ketorolac for now for pain in the neck - watch Scr closely (stable WNL today). Pt plans to stay for his entire treatment course here 6-8 weeks depending on improvement.    Plan: Continue vancomycin 1500 mg IV every 12 hours for now Vancomycin trough weekly - next 11/12 at 1630 Monitor renal function   Height: 5\' 10"  (177.8 cm) Weight: 72.6 kg (160 lb 0.9 oz) IBW/kg (Calculated) : 73  Temp (24hrs), Avg:98.2 F (36.8 C), Min:98 F (36.7 C), Max:98.5 F (36.9 C)  Recent Labs  Lab 11/29/19 0514 11/30/19 0437 12/01/19 0343 12/03/19 0449  WBC 4.2  --   --   --   CREATININE 0.75  --  0.82 0.74  VANCOTROUGH  --  13*  --   --     Estimated Creatinine Clearance: 133.6 mL/min (by C-G formula based on SCr of 0.74 mg/dL).    Allergies  Allergen Reactions  . Ibuprofen Other (See Comments)    Stomach pains    13/10/21, PharmD, BCPS Please check AMION for all [facility] contact numbers Clinical Pharmacist 12/03/2019 10:30 AM

## 2019-12-03 NOTE — Progress Notes (Signed)
[facility] PROGRESS NOTE  Daniel Raymond LPF:[id number] DOB: Aug 28, 1985 DOA: 11/17/2019 PCP: Patient, No Pcp Per    Status: Remains inpatient appropriate because:Unsafe d/c plan and IV treatments appropriate due to intensity of illness or inability to take PO.  Requires IV antibiotics to treat osteomyelitis.  Unable to discharge home with IV line in place due to history of IV drug abuse with heroin.  Last dose IV antibiotics due 12/23/2019   Dispo: The patient is from: Home              Anticipated d/c is to: Home              Anticipated d/c date is: > 3 days (12/23/2019)              Patient currently is not medically stable to d/c.  Unsafe discharge plan in regards to requirement for IV antibiotics and patient with known IV drug abuse history   Code Status: Full Family Communication: Patient only DVT prophylaxis: Lovenox Vaccination status: We will need to discuss with patient whether he has received Covid vaccine  Foley catheter: No  HPI: Past medical history of IV drug abuse, tobacco abuse, hep C, asthma, chronic osteomyelitis presents with worsening neck pain. 06/2019 diagnosed with cervical osteomyelitis at [facility]. Left AMA. 9/18-26 admitted at [facility] for the same. Treated with IV vancomycin and Rocephin. Left AMA 9/26. 10/4 admitted at [facility]. Left AMA. 10/18-26 admitted at [facility] again. Left AMA. Found to be using heroin in the hospital. Currently plan is continue IV antibiotics.   Subjective: Awake.  Continues to have significant neck pain.  Reports headache today and difficulty sleeping.  Note patient sitting up in bed leaning forward using metal case as lab table and drawing.  Discussed with him need to change positioning and sit with head of bed up against bed to have better support while drawing and coloring.  Discussed continued use of stretching and K pad.  Objective: Vitals:    12/03/19 0000 12/03/19 0500  BP: 118/80 123/73  Pulse: 72 75  Resp: 18 19  Temp: 98.1 F (36.7 C) 98 F (36.7 C)  SpO2: 99% 100%    Intake/Output Summary (Last 24 hours) at 12/03/2019 0742 Last data filed at 12/03/2019 0600 Gross per 24 hour  Intake 3546.75 ml  Output --  Net 3546.75 ml   Filed Weights   11/17/19 1804 11/18/19 1507  Weight: 72.6 kg 72.6 kg    Exam:  Constitutional: NAD, calm, comfortable Respiratory: clear to auscultation bilaterally, room air normal respiratory effort.  Cardiovascular: Regular rate and rhythm, no murmurs / rubs / gallops. No extremity edema. 2+ pedal pulses.  Abdomen: no tenderness, Bowel sounds positive.  Musculoskeletal: No joint deformity upper and lower extremities. Good ROM,  Normal muscle tone.  Tender and upper back and neck consistent with muscle spasms. Neurologic: CN 2-12 grossly intact. Sensation intact, DTR normal. Strength 5/5 x all 4 extremities.  Psychiatric: Normal judgment and insight. Alert and oriented x 3. Normal mood.    Assessment/Plan: Acute problems: Cervical spine osteomyelitis/Septic arthritis -Presented following multiple recent hospitalizations after leaving AMA with continued neck pain.  -MR C-spine with abnormal signal/contrast C2--3 facet joint consistent with septic arthritis with no evidence of active discitis/osteomyelitis but with chronic osteomyelitis C6/7.  -Remains afebrile -cultures have been negative -ED provider discussed case with neurosurgery PA, Kim who advised no intervention without epidural involvement and recommended medical management. -ID recommended  6 weeks of IV antibiotics (Rocephin and vancomycin) with projected end date 12/23/2019 -states 7 days of therapy given at [facility] facility should be included in days of therapy -Continues to report significant neck pain despite changing Toradol from every 8 hours to every 6 hours and increasing Robaxin to 750 mg every 6 hours scheduled -Obtain  follow-up MRI C-spine with and without contrast -OT evaluation ordered  Polysubstance abuse -Patient with long standing history of heroin dependence, has history of leaving AMA and returning to different hospital ED's.  -Recently started on Suboxone at [facility] health, but was discontinued as he was using heroin inpatient.  -Currently on Suboxone 8-2mg  PO BID wishes to continue after discharge.  Patient will contact halfway house he plans to discharge to to determine if he is able to continue on Suboxone residing there.  He also will contact the [facility] treatment [facility] or the [facility] noting these are walk-in clinics that patient is familiar with that provide Suboxone treatment for $15 per day --continue to montior on COWS protocol --restrict visitation 2/2 history of using heroin during previous inpatient visit at outside hospital-patient made aware of rationale for restricting visitors and verbalizes understanding  Acute on chronic neck pain control. -Continue Suboxone as above. -Continue Robaxin, Toradol prn and lidocaine patch    Other problems: Hepatitis C -Viral load [id number] on 10/10/2019 at [facility].  -Is treatment nave due to ongoing IV drug abuse.  -HIV, Gonococcal and Chlamydia work-up negative. -Per ID will follow up outpatient.  Hepatitis C genotype and HCV FibroSure ordered by ID  Tobacco dependence -Counseled on need for complete cessation. --Nicotine patch  Limited IV access. -Secondary to history of IV drug abuse -Currently has single-lumen midline catheter in place  Nutrition Status: Nutrition Problem: Increased nutrient needs Etiology: acute illness (osteomyelitis of cervical spine) Signs/Symptoms: estimated needs Interventions: Ensure Enlive (each supplement provides 350kcal and 20 grams of protein), MVI Estimated body mass index is 22.97 kg/m as calculated from the following:   Height as of this encounter: 5'  10" (1.778 m).   Weight as of this encounter: 72.6 kg.     Data Reviewed: Basic Metabolic Panel: Recent Labs  Lab 11/29/19 0514 12/01/19 0343 12/03/19 0449  NA 136 137 138  K 3.9 3.9 4.1  CL 100 103 102  CO2 27 27 28   GLUCOSE 101* 106* 83  BUN 13 18 17   CREATININE 0.75 0.82 0.74  CALCIUM 9.5 9.4 10.0   Liver Function Tests: No results for input(s): AST, ALT, ALKPHOS, BILITOT, PROT, ALBUMIN in the last 168 hours. No results for input(s): LIPASE, AMYLASE in the last 168 hours. No results for input(s): AMMONIA in the last 168 hours. CBC: Recent Labs  Lab 11/29/19 0514  WBC 4.2  NEUTROABS 1.8  HGB 10.2*  HCT 31.9*  MCV 86.0  PLT 213   Cardiac Enzymes: No results for input(s): CKTOTAL, CKMB, CKMBINDEX, TROPONINI in the last 168 hours. BNP (last 3 results) No results for input(s): BNP in the last 8760 hours.  ProBNP (last 3 results) No results for input(s): PROBNP in the last 8760 hours.  CBG: No results for input(s): GLUCAP in the last 168 hours.  No results found for this or any previous visit (from the past 240 hour(s)).   Studies: No results found.  Scheduled Meds: . buprenorphine-naloxone  1 tablet Sublingual BID  . enoxaparin (LOVENOX) injection  40 mg Subcutaneous Q24H  . influenza vac split quadrivalent PF  0.5 mL Intramuscular Tomorrow-1000  .  ketorolac  15 mg Intravenous Q6H  . lidocaine  1 patch Transdermal Q24H  . methocarbamol  750 mg Oral QID  . multivitamin with minerals  1 tablet Oral Daily  . sodium chloride flush  10-40 mL Intracatheter Q12H  . sodium chloride flush  10-40 mL Intracatheter Q12H   Continuous Infusions: . cefTRIAXone (ROCEPHIN)  IV 2 g (12/03/19 1610)  . vancomycin 1,500 mg (12/03/19 0413)    Principal Problem:   Subacute osteomyelitis of cervical spine (HCC) Active Problems:   IV drug abuse (HCC)   Polysubstance dependence including opioid type drug with complication, episodic abuse, with unspecified complication  (HCC)   Tobacco dependence   Hepatitis C   Methamphetamine dependence, continuous (HCC)   Consultants:  Psychiatry  Infectious disease  Procedures:  Placement of midline IV catheter  Antibiotics: Anti-infectives (From admission, onward)   Start     Dose/Rate Route Frequency Ordered Stop   11/23/19 0600  cefTRIAXone (ROCEPHIN) 2 g in sodium chloride 0.9 % 100 mL IVPB        2 g 200 mL/hr over 30 Minutes Intravenous Every 24 hours 11/22/19 1135     11/20/19 0200  vancomycin (VANCOREADY) IVPB 1500 mg/300 mL        1,500 mg 150 mL/hr over 120 Minutes Intravenous Every 12 hours 11/19/19 1511     11/18/19 1615  Oritavancin Diphosphate (ORBACTIV) 1,200 mg in dextrose 5 % IVPB        1,200 mg 333.3 mL/hr over 180 Minutes Intravenous Once 11/18/19 1518 11/19/19 1907   11/18/19 1000  cefTRIAXone (ROCEPHIN) 2 g in sodium chloride 0.9 % 100 mL IVPB  Status:  Discontinued        2 g 200 mL/hr over 30 Minutes Intravenous Every 12 hours 11/17/19 2315 11/22/19 1135   11/17/19 2330  vancomycin (VANCOREADY) IVPB 1500 mg/300 mL  Status:  Discontinued        1,500 mg 150 mL/hr over 120 Minutes Intravenous Every 12 hours 11/17/19 2315 11/19/19 1511   11/17/19 2315  cefTRIAXone (ROCEPHIN) 1 g in sodium chloride 0.9 % 100 mL IVPB  Status:  Discontinued        1 g 200 mL/hr over 30 Minutes Intravenous Every 12 hours 11/17/19 2300 11/17/19 2315       Time spent: 20 minutes    Junious Silk ANP  [facility] Pager [phone]. If 7PM-7AM, please contact night-coverage at [email or web address] 12/03/2019, 7:42 AM  LOS: 15 days

## 2019-12-04 ENCOUNTER — Encounter (HOSPITAL_COMMUNITY): Payer: Self-pay | Admitting: Internal Medicine

## 2019-12-04 LAB — VANCOMYCIN, TROUGH: Vancomycin Tr: 16 ug/mL (ref 15–20)

## 2019-12-04 MED ORDER — DOCUSATE SODIUM 100 MG PO CAPS
200.0000 mg | ORAL_CAPSULE | Freq: Once | ORAL | Status: AC
  Administered 2019-12-04: 200 mg via ORAL
  Filled 2019-12-04: qty 2

## 2019-12-04 NOTE — Progress Notes (Signed)
PROGRESS NOTE  Daniel Raymond [id number]:[id number] DOB: 10/30/1985 DOA: 11/17/2019 PCP: Patient, No Pcp Per  Brief History   Past medical history of IV drug abuse, tobacco abuse, hep C, asthma, chronic osteomyelitis presents with worsening neck pain. 06/2019 diagnosed with cervical osteomyelitis at [facility]. Left AMA. 9/18-26 admitted at [facility] for the same. Treated with IV vancomycin and Rocephin. Left AMA 9/26. 10/4 admitted at [facility]. Left AMA. 10/18-26 admitted at [facility] again. Left AMA. Found to be using heroin in the hospital. The patient is found to be + for hepatitic C. Fibrotype is F1-F2. Genotype is 1a. Currently plan is continue IV antibiotics. Duration of therapy will be a total of 6 weeks which will include the 7 days of therapy that the patient had at [facility] prior to presentation to [facility]. Tentatively the last day of therapy would be 12/19/2019 per infectious disease. Due to his IV drug abuse the patient will require supervision for the duration of his IV therapy. He will require close laboratory monitoring of CBC, CMP, Vanc trough, ESR and CRP during this therapy.  On 12/03/2019 the patient was complaining of increased pain in his neck. Repeat MRI was ordered. It demonstrates improved appearance of the vertebral body edema, but also demonstrates chronic interspinous ligametn strain and injury. Cervical spondylosis was as outlined and unchanged from the MRI of 11/18/2019. At the C5-C6 interspinous space there is a signal abnormality that could reflect chronic interspinous ligament strain/injury.  Consultants  . Infectious disease  Procedures  . PICC line placement  Antibiotics   Anti-infectives (From admission, onward)   Start     Dose/Rate Route Frequency Ordered Stop   11/23/19 0600  cefTRIAXone (ROCEPHIN) 2 g in sodium chloride 0.9 % 100 mL IVPB        2 g 200 mL/hr over 30 Minutes Intravenous Every 24  hours 11/22/19 1135     11/20/19 0200  vancomycin (VANCOREADY) IVPB 1500 mg/300 mL        1,500 mg 150 mL/hr over 120 Minutes Intravenous Every 12 hours 11/19/19 1511     11/18/19 1615  Oritavancin Diphosphate (ORBACTIV) 1,200 mg in dextrose 5 % IVPB        1,200 mg 333.3 mL/hr over 180 Minutes Intravenous Once 11/18/19 1518 11/19/19 1907   11/18/19 1000  cefTRIAXone (ROCEPHIN) 2 g in sodium chloride 0.9 % 100 mL IVPB  Status:  Discontinued        2 g 200 mL/hr over 30 Minutes Intravenous Every 12 hours 11/17/19 2315 11/22/19 1135   11/17/19 2330  vancomycin (VANCOREADY) IVPB 1500 mg/300 mL  Status:  Discontinued        1,500 mg 150 mL/hr over 120 Minutes Intravenous Every 12 hours 11/17/19 2315 11/19/19 1511   11/17/19 2315  cefTRIAXone (ROCEPHIN) 1 g in sodium chloride 0.9 % 100 mL IVPB  Status:  Discontinued        1 g 200 mL/hr over 30 Minutes Intravenous Every 12 hours 11/17/19 2300 11/17/19 2315    .   Subjective  The patient is resting comfortably. No new complaints.  Objective   Vitals:  Vitals:   12/04/19 0400 12/04/19 0822  BP: 111/76 (!) 111/56  Pulse: 85 66  Resp: 17 14  Temp: 98 F (36.7 C) 97.8 F (36.6 C)  SpO2: 100% 97%   Exam:  Constitutional:  . The patient is awake, alert, and oriented x 3. No acute distress. Neck:  . neck  appears normal, no masses, normal ROM, supple . no thyromegaly Respiratory:  . No increased work of breathing. . No wheezes, rales, or rhonchi . No tactile fremitus Cardiovascular:  . Regular rate and rhythm . No murmurs, ectopy, or gallups. . No lateral PMI. No thrills. Abdomen:  . Abdomen is soft, non-tender, non-distended . No hernias, masses, or organomegaly . Normoactive bowel sounds.  Musculoskeletal:  . No cyanosis, clubbing, or edema Skin:  . No rashes, lesions, ulcers . palpation of skin: no induration or nodules Neurologic:  . CN 2-12 intact . Sensation all 4 extremities intact Psychiatric:  . Mental  status o Mood, affect appropriate o Orientation to person, place, time  . judgment and insight appear intact  I have personally reviewed the following:   Today's Data  . Vitals  Imaging  . MRI C-Spine  Scheduled Meds: . buprenorphine-naloxone  1 tablet Sublingual BID  . enoxaparin (LOVENOX) injection  40 mg Subcutaneous Q24H  . influenza vac split quadrivalent PF  0.5 mL Intramuscular Tomorrow-1000  . ketorolac  15 mg Intravenous Q6H  . lidocaine  1 patch Transdermal Q24H  . methocarbamol  750 mg Oral QID  . multivitamin with minerals  1 tablet Oral Daily  . sodium chloride flush  10-40 mL Intracatheter Q12H  . sodium chloride flush  10-40 mL Intracatheter Q12H   Continuous Infusions: . cefTRIAXone (ROCEPHIN)  IV 2 g (12/04/19 0531)  . vancomycin 1,500 mg (12/04/19 2423)    Principal Problem:   Subacute osteomyelitis of cervical spine (HCC) Active Problems:   IV drug abuse (HCC)   Polysubstance dependence including opioid type drug with complication, episodic abuse, with unspecified complication (HCC)   Tobacco dependence   Hepatitis C   Methamphetamine dependence, continuous ([place])   LOS: 16 days   A & P  Cervical spine osteomyelitis/Septic arthritis -Presented following multiple recent hospitalizations after leaving AMA with continued neck pain.  -MR C-spine with abnormal signal/contrast C2--3 facet joint consistent with septic arthritis with no evidence of active discitis/osteomyelitis but with chronic osteomyelitis C6/7.  -Remains afebrile -cultures have been negative -ED provider discussed case with neurosurgery PA, Kim who advised no intervention without epidural involvement and recommended medical management. -ID recommended 6 weeks of IV antibiotics (Rocephin and vancomycin) with projected end date 12/23/2019 -states 7 days of therapy given at [facility] facility should be included in days of therapy -Continues to report significant neck pain despite changing  Toradol from every 8 hours to every 6 hours and increasing Robaxin to 750 mg every 6 hours scheduled -Obtain follow-up MRI C-spine with and without contrast -OT evaluation ordered  Polysubstance abuse -Patient with long standing history of heroin dependence, has history of leaving AMA and returning to different hospital ED's.  -Recently started on Suboxone at [facility], but was discontinued as he was using heroin inpatient.  -Currently on Suboxone 8-5m PO BID wishes to continue after discharge.  Patient will contact halfway house he plans to discharge to to determine if he is able to continue on Suboxone residing there.  [place] are walk-in clinics that patient is familiar with that provide Suboxone treatment for $15 per day --continue to montior on COWS protocol --restrict visitation 2/2 history of using heroin during previous inpatient visit at outside hospital-patient made aware of rationale for restricting visitors and verbalizes understanding  Acute on chronic neck pain control. -Continue Suboxone as above. -Continue Robaxin, Toradol prn and  lidocaine patch Will order C-collar.  Other problems: Hepatitis C -Viral load 173,697 on 10/10/2019 at [facility].  -Is treatment nave due to ongoing IV drug abuse.  -HIV, Gonococcal and Chlamydia work-up negative. -Per ID will follow up outpatient.  Hepatitis C genotype is 1a and HCV FibroSure status is F1-F2.  Tobacco dependence -Counseled on need for complete cessation. --Nicotine patch  Limited IV access. -Secondary to history of IV drug abuse -Currently has single-lumen midline catheter in place  Nutrition Status: Nutrition Problem: Increased nutrient needs Etiology: acute illness (osteomyelitis of cervical spine) Signs/Symptoms: estimated needs Interventions: Ensure Enlive (each supplement provides 350kcal and  20 grams of protein), MVI Estimated body mass index is 22.97 kg/m as calculated from the following:   Height as of this encounter: 5' 10"  (1.778 m).   Weight as of this encounter: 72.6 kg.  Abri Vacca, DO [facility] Direct contact: see [email or web address]  7PM-7AM contact night coverage as above 12/04/2019, 3:49 PM  LOS: 16 days

## 2019-12-04 NOTE — Progress Notes (Signed)
Pharmacy Antibiotic Note  Daniel Raymond is a 34 y.o. male admitted on 11/17/2019 with discitis, left outside hospital AMA in June 2021.  Pharmacy has been consulted for vancomycin dosing. Blood cultures from [facility] 10/18 NGTD, therapeutic levels on vancomycin 1500 mg IV every 12 hours with last dose charted on 10/26 AM before patient left AMA. Also was on ceftriaxone 2g IV every 12 hours, per MD.  BMET ordered MWF; SCr stable. Vancomycin trough this morning drawn on 11/13 0530 within goal at 16 (goal 15-20).  Continuing Ketorolac for now for pain in the neck; SCr WNL, stable. Patient plans to stay for his entire treatment course here 6-8 weeks depending on improvement.    Plan: - Continue vancomycin 1500 mg IV every 12 hours for now - Vancomycin trough weekly - next 11/20 at 0530 - Monitor renal function   Height: 5\' 10"  (177.8 cm) Weight: 72.6 kg (160 lb 0.9 oz) IBW/kg (Calculated) : 73  Temp (24hrs), Avg:98 F (36.7 C), Min:97.8 F (36.6 C), Max:98.4 F (36.9 C)  Recent Labs  Lab 11/29/19 0514 11/30/19 0437 12/01/19 0343 12/03/19 0449 12/03/19 1807 12/04/19 0530  WBC 4.2  --   --   --   --   --   CREATININE 0.75  --  0.82 0.74  --   --   VANCOTROUGH  --    < >  --   --  13* 16   < > = values in this interval not displayed.    Estimated Creatinine Clearance: 133.6 mL/min (by C-G formula based on SCr of 0.74 mg/dL).    Allergies  Allergen Reactions  . Ibuprofen Other (See Comments)    Stomach pains       Jary Louvier L. 12/06/19, PharmD [facility] PGY2 Pharmacy Resident Weekends 7:00 am - 3:00 pm, please call [phone] 12/04/19     7:18 AM  Please check AMION for all [facility] Pharmacy phone numbers After 10:00 PM, call the Main Pharmacy [phone]

## 2019-12-04 NOTE — Evaluation (Signed)
Physical Therapy Evaluation Patient Details Name: Daniel Raymond MRN: [id number] DOB: 02/26/1985 Today's Date: 12/04/2019   History of Present Illness  Pt is a 34 y.o. male admitted 11/17/19 with continued neck pain; of note, multiple recent hospitalizations ([place], [place], [facility]) where pt left AMA without finishing tx for septic cervical arthritis. MRI showing C2-3 septic arthritis; plan for medical managemt with IV antibiotic therapy. PMH includes IVDA, tobacco use, Hep C, asthma, cervical spine osteomyelitis.    Clinical Impression  Pt mobilizing independently in room upon arrival of PT, agreeable to evaluation at this time. Prior to admission the pt was independent with all mobility, working manual labor as a tree cutter (pt reports he was climbing trees as recently as the day before this admission). The pt now presents with limitations in cervical ROM, with most significant impariments in cervical extension, L rotation, and R side bending. The pt was directed through a series of stretches to facilitate improves cervical ROM, and was given a printout of the exercises that the pt can follow through his stay as well as at home following d/c. No other acute PT needs identified at this time, thank you for the consult.      Follow Up Recommendations No PT follow up    Equipment Recommendations  None recommended by PT    Recommendations for Other Services       Precautions / Restrictions Precautions Precautions: None Restrictions Weight Bearing Restrictions: No      Mobility  Bed Mobility Overal bed mobility: Independent                  Transfers Overall transfer level: Independent Equipment used: None                Ambulation/Gait Ambulation/Gait assistance: Modified independent (Device/Increase time) Gait Distance (Feet): 40 Feet Assistive device: IV Pole Gait Pattern/deviations: WFL(Within Functional Limits)     General Gait Details: Pt able to safely  move and use IV pole to walk in room, but was also able to mobilize without using IV pole with good stability     Balance Overall balance assessment: No apparent balance deficits (not formally assessed)                                           Pertinent Vitals/Pain Pain Assessment: 0-10 Pain Score: 7  Pain Location: neck and hands Pain Descriptors / Indicators: Sore;Dull;Aching (stiff) Pain Intervention(s): Limited activity within patient's tolerance;Monitored during session;Repositioned    Home Living Family/patient expects to be discharged to:: Shelter/Homeless                 Additional Comments: plans to go to halfway house when completed medical course    Prior Function Level of Independence: Independent         Comments: limited by pain     Hand Dominance   Dominant Hand: Right    Extremity/Trunk Assessment   Upper Extremity Assessment Upper Extremity Assessment: Overall WFL for tasks assessed    Lower Extremity Assessment Lower Extremity Assessment: Overall WFL for tasks assessed    Cervical / Trunk Assessment Cervical / Trunk Assessment: Normal  Communication   Communication: No difficulties  Cognition Arousal/Alertness: Awake/alert Behavior During Therapy: Restless;WFL for tasks assessed/performed Overall Cognitive Status: No family/caregiver present to determine baseline cognitive functioning Area of Impairment: Safety/judgement;Problem solving  Safety/Judgement: Decreased awareness of safety   Problem Solving: Requires verbal cues General Comments: Benenfits from significant cues for stretching/exercise technique, but agreeable to all education given through session.       General Comments General comments (skin integrity, edema, etc.): Pt mobilizing independently upon arrival of PT, with chewing tobacco in his bed, RN alerted    Exercises Other Exercises Other Exercises: Cervical  stretches including: lateral sidebending, rotation with use of towel, levator, and trap. 2 x 30 sec each. Pt given HEP with all stretches, descriptions, and information   Assessment/Plan    PT Assessment Patent does not need any further PT services  PT Problem List         PT Treatment Interventions      PT Goals (Current goals can be found in the Care Plan section)  Acute Rehab PT Goals Patient Stated Goal: reduce neck pain PT Goal Formulation: With patient Time For Goal Achievement: 12/18/19 Potential to Achieve Goals: Good     AM-PAC PT "6 Clicks" Mobility  Outcome Measure Help needed turning from your back to your side while in a flat bed without using bedrails?: None Help needed moving from lying on your back to sitting on the side of a flat bed without using bedrails?: None Help needed moving to and from a bed to a chair (including a wheelchair)?: None Help needed standing up from a chair using your arms (e.g., wheelchair or bedside chair)?: None Help needed to walk in hospital room?: None Help needed climbing 3-5 steps with a railing? : A Little 6 Click Score: 23    End of Session   Activity Tolerance: Patient tolerated treatment well Patient left: in bed (sitting EOB) Nurse Communication: Mobility status PT Visit Diagnosis: Pain Pain - Right/Left: Left Pain - part of body:  (neck)    Time: 5366-4403 PT Time Calculation (min) (ACUTE ONLY): 32 min   Charges:   PT Evaluation $PT Eval Low Complexity: 1 Low PT Treatments $Therapeutic Exercise: 8-22 mins        Daniel Raymond, PT, DPT   Acute Rehabilitation Department Pager #: [phone]  Daniel Raymond 12/04/2019, 11:48 AM

## 2019-12-05 NOTE — Progress Notes (Signed)
PROGRESS NOTE  Daniel Raymond RAX:[id number] DOB: 10-21-1985 DOA: 11/17/2019 PCP: Patient, No Pcp Per  Brief History   Past medical history of IV drug abuse, tobacco abuse, hep C, asthma, chronic osteomyelitis presents with worsening neck pain. 06/2019 diagnosed with cervical osteomyelitis at [facility]. Left AMA. 9/18-26 admitted at [facility] for the same. Treated with IV vancomycin and Rocephin. Left AMA 9/26. 10/4 admitted at [facility]. Left AMA. 10/18-26 admitted at [facility] again. Left AMA. Found to be using heroin in the hospital. The patient is found to be + for hepatitic C. Fibrotype is F1-F2. Genotype is 1a. Currently plan is continue IV antibiotics. Duration of therapy will be a total of 6 weeks which will include the 7 days of therapy that the patient had at [facility] prior to presentation to [facility]. Tentatively the last day of therapy would be 12/19/2019 per infectious disease. Due to his IV drug abuse the patient will require supervision for the duration of his IV therapy. He will require close laboratory monitoring of CBC, CMP, Vanc trough, ESR and CRP during this therapy.  On 12/03/2019 the patient was complaining of increased pain in his neck. Repeat MRI was ordered. It demonstrates improved appearance of the vertebral body edema, but also demonstrates chronic interspinous ligametn strain and injury. Cervical spondylosis was as outlined and unchanged from the MRI of 11/18/2019. At the C5-C6 interspinous space there is a signal abnormality that could reflect chronic interspinous ligament strain/injury.  Consultants  . Infectious disease  Procedures  . PICC line placement  Antibiotics   Anti-infectives (From admission, onward)   Start     Dose/Rate Route Frequency Ordered Stop   11/23/19 0600  cefTRIAXone (ROCEPHIN) 2 g in sodium chloride 0.9 % 100 mL IVPB        2 g 200 mL/hr over 30 Minutes Intravenous Every 24  hours 11/22/19 1135     11/20/19 0200  vancomycin (VANCOREADY) IVPB 1500 mg/300 mL        1,500 mg 150 mL/hr over 120 Minutes Intravenous Every 12 hours 11/19/19 1511     11/18/19 1615  Oritavancin Diphosphate (ORBACTIV) 1,200 mg in dextrose 5 % IVPB        1,200 mg 333.3 mL/hr over 180 Minutes Intravenous Once 11/18/19 1518 11/19/19 1907   11/18/19 1000  cefTRIAXone (ROCEPHIN) 2 g in sodium chloride 0.9 % 100 mL IVPB  Status:  Discontinued        2 g 200 mL/hr over 30 Minutes Intravenous Every 12 hours 11/17/19 2315 11/22/19 1135   11/17/19 2330  vancomycin (VANCOREADY) IVPB 1500 mg/300 mL  Status:  Discontinued        1,500 mg 150 mL/hr over 120 Minutes Intravenous Every 12 hours 11/17/19 2315 11/19/19 1511   11/17/19 2315  cefTRIAXone (ROCEPHIN) 1 g in sodium chloride 0.9 % 100 mL IVPB  Status:  Discontinued        1 g 200 mL/hr over 30 Minutes Intravenous Every 12 hours 11/17/19 2300 11/17/19 2315      Subjective  The patient is resting comfortably. No new complaints.  Objective   Vitals:  Vitals:   12/05/19 0331 12/05/19 0845  BP: 103/65 (!) 87/56  Pulse: 75 62  Resp: 16 17  Temp: 98.2 F (36.8 C) 97.9 F (36.6 C)  SpO2: 98% 98%   Exam:  Constitutional:  . The patient is awake, alert, and oriented x 3. No acute distress. Neck:  . neck appears  normal, no masses, normal ROM, supple . no thyromegaly Respiratory:  . No increased work of breathing. . No wheezes, rales, or rhonchi . No tactile fremitus Cardiovascular:  . Regular rate and rhythm . No murmurs, ectopy, or gallups. . No lateral PMI. No thrills. Abdomen:  . Abdomen is soft, non-tender, non-distended . No hernias, masses, or organomegaly . Normoactive bowel sounds.  Musculoskeletal:  . No cyanosis, clubbing, or edema Skin:  . No rashes, lesions, ulcers . palpation of skin: no induration or nodules Neurologic:  . CN 2-12 intact . Sensation all 4 extremities intact Psychiatric:  . Mental  status o Mood, affect appropriate o Orientation to person, place, time  . judgment and insight appear intact  I have personally reviewed the following:   Today's Data  . Vitals  Imaging  . MRI C-Spine  Scheduled Meds: . buprenorphine-naloxone  1 tablet Sublingual BID  . enoxaparin (LOVENOX) injection  40 mg Subcutaneous Q24H  . influenza vac split quadrivalent PF  0.5 mL Intramuscular Tomorrow-1000  . ketorolac  15 mg Intravenous Q6H  . lidocaine  1 patch Transdermal Q24H  . methocarbamol  750 mg Oral QID  . multivitamin with minerals  1 tablet Oral Daily  . sodium chloride flush  10-40 mL Intracatheter Q12H  . sodium chloride flush  10-40 mL Intracatheter Q12H   Continuous Infusions: . cefTRIAXone (ROCEPHIN)  IV 2 g (12/05/19 0735)  . vancomycin 1,500 mg (12/05/19 0520)    Principal Problem:   Subacute osteomyelitis of cervical spine (HCC) Active Problems:   IV drug abuse (HCC)   Polysubstance dependence including opioid type drug with complication, episodic abuse, with unspecified complication (HCC)   Tobacco dependence   Hepatitis C   Methamphetamine dependence, continuous ([place])   LOS: 17 days   A & P  Cervical spine osteomyelitis/Septic arthritis: Presented following multiple recent hospitalizations after leaving AMA with continued neck pain. MR C-spine with abnormal signal/contrast C2--3 facet joint consistent with septic arthritis with no evidence of active discitis/osteomyelitis but with chronic osteomyelitis C6/7. The patient was discussed with neurosurgery PA who advised no surgical intervention unless there was epidural involvement. Medical management is recommended. ID recommended 6 weeks of IV antibiotics (Rocephin and vancomycin) with projected end date 12/23/2019. ID states that 7 days of therapy given at [facility] facility should be included in days of therapy. MR repeated on 12/03/2019 after had complaints of increased neck pain. It demonstrated improved  appearance of areas of vertebral body osteomyelitis and evidence of chronic ligamentous strain in the posterior neck. C-collar has been ordered for patient comfort. The patient has had no further fevers, and surveillance cultures have had no growth. Toradol from every 8 hours to every 6 hours and increasing Robaxin to 750 mg every 6 hours scheduled. The patient's therapy must be completed as inpatient so it may be supervised. He has a history of polysubstance abuse.  Polysubstance abuse: Patient with long standing history of heroin dependence, has history of leaving AMA and returning to different hospital ED's. he is receiving Suboxone 8-70m PO BID [facility] He wishes to continue this after discharge.  Patient will contact halfway house he plans to discharge to to determine if he is able to continue on Suboxone residing there.  [place] [facility] [place] these are walk-in clinics that patient is familiar with that provide Suboxone treatment for $15 per day. He is being monitored on the  COWS protocol. Visitation for this patient is restricted due to history of using heroin during previous inpatient visit at outside hospital-patient. The patient has been made aware of rationale for restricting visitors and verbalizes understanding.  Acute on chronic neck pain control: C-collar ordered. Continue Suboxone as above. Continue Robaxin, Toradol prn and lidocaine patch.  Hepatitis C: Viral load [id number] on 10/10/2019 at [facility]. The patient is treatment nave due to ongoing IV drug abuse. HIV, Gonococcal and Chlamydia work-up negative. Per ID will follow up outpatient.  Hepatitis C genotype is 1a and HCV FibroSure status is F1-F2.  Tobacco dependence: Counseled on need for complete cessation. Nicotine patch is available on an as needed basis.  Limited IV access:  Secondary to history of IV drug abuse.  The patient currently has single-lumen midline catheter in place. This must be removed before the patient leaves the hospital for safety concerns.  Nutrition Status: Nutrition Problem: Increased nutrient needs Etiology: acute illness (osteomyelitis of cervical spine) Signs/Symptoms: estimated needs Interventions: Ensure Enlive (each supplement provides 350kcal and 20 grams of protein), MVI Estimated body mass index is 22.97 kg/m as calculated from the following:   Height as of this encounter: 5' 10"  (1.778 m).   Weight as of this encounter: 72.6 kg.  I have seen and examined this patient myself. I have spent 32 minutes in his evaluation and care.  Rosalynd Mcwright, DO [facility] Direct contact: see [email or web address]  7PM-7AM contact night coverage as above 12/05/2019, 2:23 PM  LOS: 16 days

## 2019-12-06 LAB — BASIC METABOLIC PANEL
Anion gap: 11 (ref 5–15)
BUN: 16 mg/dL (ref 6–20)
CO2: 25 mmol/L (ref 22–32)
Calcium: 9.5 mg/dL (ref 8.9–10.3)
Chloride: 101 mmol/L (ref 98–111)
Creatinine, Ser: 0.77 mg/dL (ref 0.61–1.24)
GFR, Estimated: >60 mL/min (ref >60)
Glucose, Bld: 96 mg/dL (ref 70–99)
Potassium: 4 mmol/L (ref 3.5–5.1)
Sodium: 137 mmol/L (ref 135–145)

## 2019-12-06 LAB — CBC WITH DIFFERENTIAL/PLATELET
Abs Immature Granulocytes: 0.01 10*3/uL (ref 0.00–0.07)
Basophils Absolute: 0 10*3/uL (ref 0.0–0.1)
Basophils Relative: 0 %
Eosinophils Absolute: 0.2 10*3/uL (ref 0.0–0.5)
Eosinophils Relative: 6 %
HCT: 27.8 % — ABNORMAL LOW (ref 39.0–52.0)
Hemoglobin: 9 g/dL — ABNORMAL LOW (ref 13.0–17.0)
Immature Granulocytes: 0 %
Lymphocytes Relative: 46 %
Lymphs Abs: 1.5 10*3/uL (ref 0.7–4.0)
MCH: 27.6 pg (ref 26.0–34.0)
MCHC: 32.4 g/dL (ref 30.0–36.0)
MCV: 85.3 fL (ref 80.0–100.0)
Monocytes Absolute: 0.4 10*3/uL (ref 0.1–1.0)
Monocytes Relative: 11 %
Neutro Abs: 1.3 10*3/uL — ABNORMAL LOW (ref 1.7–7.7)
Neutrophils Relative %: 37 %
Platelets: 165 10*3/uL (ref 150–400)
RBC: 3.26 MIL/uL — ABNORMAL LOW (ref 4.22–5.81)
RDW: 15.7 % — ABNORMAL HIGH (ref 11.5–15.5)
WBC: 3.4 10*3/uL — ABNORMAL LOW (ref 4.0–10.5)
nRBC: 0 % (ref 0.0–0.2)

## 2019-12-06 LAB — C-REACTIVE PROTEIN: CRP: 2.1 mg/dL — ABNORMAL HIGH (ref <1.0)

## 2019-12-06 LAB — SEDIMENTATION RATE: Sed Rate: 14 mm/hr (ref 0–16)

## 2019-12-06 NOTE — Progress Notes (Addendum)
[facility] PROGRESS NOTE  Daniel Raymond FMB:[id number] DOB: [birth year] DOA: 11/17/2019 PCP: Patient, No Pcp Per    Status: Remains inpatient appropriate because:Unsafe d/c plan and IV treatments appropriate due to intensity of illness or inability to take PO.  Requires IV antibiotics to treat osteomyelitis.  Unable to discharge home with IV line in place due to history of IV drug abuse with heroin.  Last dose IV antibiotics due 12/23/2019   Dispo: The patient is from: Home              Anticipated d/c is to: Home              Anticipated d/c date is: > 3 days (12/23/2019)              Patient currently is not medically stable to d/c.  Unsafe discharge plan in regards to requirement for IV antibiotics/PICC line in a patient with known IV drug abuse history   Code Status: Full Family Communication: Patient only DVT prophylaxis: Lovenox Vaccination status: We will need to discuss with patient whether he has received Covid vaccine  Foley catheter: No  HPI: Past medical history of IV drug abuse, tobacco abuse, hep C, asthma, chronic osteomyelitis presents with worsening neck pain. 06/2019 diagnosed with cervical osteomyelitis at [facility]. Left AMA. 9/18-26 admitted at [facility] for the same. Treated with IV vancomycin and Rocephin. Left AMA 9/26. 10/4 admitted at [facility]. Left AMA. 10/18-26 admitted at [facility] again. Left AMA. Found to be using heroin in the hospital. Currently plan is continue IV antibiotics.   Subjective: Awakened.  Continues to have mild neck pain but is aware this is related to musculoskeletal issues and that overall his osteomyelitis is improving based on recent imaging.  Objective: Vitals:   12/06/19 0226 12/06/19 0900  BP: 126/71 100/60  Pulse: 71 66  Resp: 20 18  Temp: 98.7 F (37.1 C) 98.3 F (36.8 C)  SpO2: 100% 100%    Intake/Output Summary (Last 24 hours) at 12/06/2019  1200 Last data filed at 12/05/2019 1300 Gross per 24 hour  Intake 240 ml  Output --  Net 240 ml   Filed Weights   11/17/19 1804 11/18/19 1507  Weight: 72.6 kg 72.6 kg    Exam:  Constitutional: NAD, awakened, calm Respiratory: clear to auscultation bilaterally, room air normal respiratory effort.  Cardiovascular: Regular rate and rhythm, No extremity edema.  Quick capillary refill Abdomen: no tenderness, Bowel sounds positive.  Tolerating solid diet Musculoskeletal: No joint deformity upper and lower extremities. Good ROM,  Normal muscle tone.  Remains tender lateral /posterior neck Neurologic: CN 2-12 grossly intact. Sensation intact, DTR normal. Strength 5/5 x all 4 extremities.  Psychiatric: Normal judgment and insight. Alert and oriented x 3. Normal mood.    Assessment/Plan: Acute problems: Cervical spine osteomyelitis/Septic arthritis -Presented following multiple recent hospitalizations after leaving AMA with continued neck pain.  -MR C-spine with abnormal signal/contrast C2--3 facet joint consistent with septic arthritis with no evidence of active discitis/osteomyelitis but with chronic osteomyelitis C6/7.  -Remains afebrile -FU cultures have been negative -ED provider discussed case with neurosurgery PA, Kim who advised no intervention without epidural involvement and recommended medical management. -ID recommended 6 weeks of IV antibiotics (Rocephin and vancomycin) with projected end date 12/23/2019 -states 7 days of therapy given at [facility] facility should be included in days of therapy -Continue IV Toradol and scheduled Robaxin  -Cont PT exercises as recommended -Repeat MRI  on 11/12 demonstrates improvement in infectious physiology.  Other changes consistent with chronic interspinous ligament strain and injury also present and likely explains patient's ongoing pain syndrome  Polysubstance abuse -Patient with long standing history of heroin dependence, has history of  leaving AMA and returning to different hospital ED's.  -Recently started on Suboxone at [facility], but was discontinued as he was using heroin inpatient.  -Currently on Suboxone 8-2mg  PO BID wishes to continue after discharge.  Patient will contact halfway house he plans to discharge to to determine if he is able to continue on Suboxone residing there.  He also will contact the [facility] or the [facility] treatment [facility] noting these are walk-in clinics that patient is familiar with that provide Suboxone treatment for $15 per day --restrict visitation 2/2 history of using heroin during previous inpatient visit at outside hospital-patient made aware of rationale for restricting visitors and verbalizes understanding  Acute on chronic neck pain control. -Continue Suboxone as above. -Continue Robaxin, Toradol prn (last doses available on 11/16) and lidocaine patch     Other problems: Hepatitis C -Viral load 173,697 on 10/10/2019 at [facility].  -Is treatment nave due to ongoing IV drug abuse.  -HIV, Gonococcal and Chlamydia work-up negative. -Per ID will follow up outpatient.  Hepatitis C genotype and HCV FibroSure ordered by ID  Tobacco dependence -Counseled on need for complete cessation. --Nicotine patch  Limited IV access. -Secondary to history of IV drug abuse -Currently has single-lumen midline catheter in place  Nutrition Status: Nutrition Problem: Increased nutrient needs Etiology: acute illness (osteomyelitis of cervical spine) Signs/Symptoms: estimated needs Interventions: Ensure Enlive (each supplement provides 350kcal and 20 grams of protein), MVI Estimated body mass index is 22.97 kg/m as calculated from the following:   Height as of this encounter: 5\' 10"  (1.778 m).   Weight as of this encounter: 72.6 kg.     Data Reviewed: Basic Metabolic Panel: Recent Labs  Lab 12/01/19 0343 12/03/19 0449 12/06/19 0407  NA 137  138 137  K 3.9 4.1 4.0  CL 103 102 101  CO2 27 28 25   GLUCOSE 106* 83 96  BUN 18 17 16   CREATININE 0.82 0.74 0.77  CALCIUM 9.4 10.0 9.5   Liver Function Tests: No results for input(s): AST, ALT, ALKPHOS, BILITOT, PROT, ALBUMIN in the last 168 hours. No results for input(s): LIPASE, AMYLASE in the last 168 hours. No results for input(s): AMMONIA in the last 168 hours. CBC: Recent Labs  Lab 12/06/19 0407  WBC 3.4*  NEUTROABS 1.3*  HGB 9.0*  HCT 27.8*  MCV 85.3  PLT 165   Cardiac Enzymes: No results for input(s): CKTOTAL, CKMB, CKMBINDEX, TROPONINI in the last 168 hours. BNP (last 3 results) No results for input(s): BNP in the last 8760 hours.  ProBNP (last 3 results) No results for input(s): PROBNP in the last 8760 hours.  CBG: No results for input(s): GLUCAP in the last 168 hours.  No results found for this or any previous visit (from the past 240 hour(s)).   Studies: No results found.  Scheduled Meds: . buprenorphine-naloxone  1 tablet Sublingual BID  . enoxaparin (LOVENOX) injection  40 mg Subcutaneous Q24H  . influenza vac split quadrivalent PF  0.5 mL Intramuscular Tomorrow-1000  . ketorolac  15 mg Intravenous Q6H  . lidocaine  1 patch Transdermal Q24H  . methocarbamol  750 mg Oral QID  . multivitamin with minerals  1 tablet Oral Daily  . sodium chloride flush  10-40 mL Intracatheter Q12H  . sodium chloride flush  10-40 mL Intracatheter Q12H   Continuous Infusions: . cefTRIAXone (ROCEPHIN)  IV 2 g (12/06/19 0732)  . vancomycin 1,500 mg (12/06/19 0540)    Principal Problem:   Subacute osteomyelitis of cervical spine (HCC) Active Problems:   IV drug abuse (HCC)   Polysubstance dependence including opioid type drug with complication, episodic abuse, with unspecified complication (HCC)   Tobacco dependence   Hepatitis C   Methamphetamine dependence, continuous (HCC)   Consultants:  Psychiatry  Infectious disease  Procedures:  Placement of  midline IV catheter  Antibiotics: Anti-infectives (From admission, onward)   Start     Dose/Rate Route Frequency Ordered Stop   11/23/19 0600  cefTRIAXone (ROCEPHIN) 2 g in sodium chloride 0.9 % 100 mL IVPB        2 g 200 mL/hr over 30 Minutes Intravenous Every 24 hours 11/22/19 1135     11/20/19 0200  vancomycin (VANCOREADY) IVPB 1500 mg/300 mL        1,500 mg 150 mL/hr over 120 Minutes Intravenous Every 12 hours 11/19/19 1511     11/18/19 1615  Oritavancin Diphosphate (ORBACTIV) 1,200 mg in dextrose 5 % IVPB        1,200 mg 333.3 mL/hr over 180 Minutes Intravenous Once 11/18/19 1518 11/19/19 1907   11/18/19 1000  cefTRIAXone (ROCEPHIN) 2 g in sodium chloride 0.9 % 100 mL IVPB  Status:  Discontinued        2 g 200 mL/hr over 30 Minutes Intravenous Every 12 hours 11/17/19 2315 11/22/19 1135   11/17/19 2330  vancomycin (VANCOREADY) IVPB 1500 mg/300 mL  Status:  Discontinued        1,500 mg 150 mL/hr over 120 Minutes Intravenous Every 12 hours 11/17/19 2315 11/19/19 1511   11/17/19 2315  cefTRIAXone (ROCEPHIN) 1 g in sodium chloride 0.9 % 100 mL IVPB  Status:  Discontinued        1 g 200 mL/hr over 30 Minutes Intravenous Every 12 hours 11/17/19 2300 11/17/19 2315       Time spent: 15 minutes    Junious Silk ANP  [facility] Pager [phone]. If 7PM-7AM, please contact night-coverage at [email or web address] 12/06/2019, 12:00 PM  LOS: 18 days

## 2019-12-07 ENCOUNTER — Inpatient Hospital Stay: Payer: Self-pay

## 2019-12-07 MED ORDER — SODIUM CHLORIDE 0.9 % IV SOLN
INTRAVENOUS | Status: DC | PRN
  Administered 2019-12-07: 1000 mL via INTRAVENOUS

## 2019-12-07 NOTE — Progress Notes (Addendum)
[facility] PROGRESS NOTE  Sher Hellinger OVZ:[id number] DOB: [birth year] DOA: 11/17/2019 PCP: Daniel Raymond, No Pcp Per     ADDENDUM: I have seen and examined this Daniel Raymond myself. Vital signs are stable, as is BMP. The Daniel Raymond is resting comfortably without new complaints. He is awake, alert, and oriented x 3. No acute distress. Heart and lung sounds are within normal limits. Abdomen is soft, non-tender, non-distended. Normal bowel sounds. Extremities are negative for cyanosis, clubbing, or edema.  I have read ARNP Ellis's note and I have discussed the Daniel Raymond in detail with her. I am in agreement with her evaluation and plan.  Ameilia Rattan, DO    Status: Remains inpatient appropriate because:Unsafe d/c plan and IV treatments appropriate due to intensity of illness or inability to take PO.  Requires IV antibiotics to treat osteomyelitis.  Unable to discharge home with IV line in place due to history of IV drug abuse with heroin.  Last dose IV antibiotics due 12/23/2019   Dispo: The Daniel Raymond is from: Home              Anticipated d/c is to: Home              Anticipated d/c date is: > 3 days (12/23/2019)              Daniel Raymond currently is not medically stable to d/c.  Unsafe discharge plan in regards to requirement for IV antibiotics/PICC line in a Daniel Raymond with known IV drug abuse history   Code Status: Full Family Communication: Daniel Raymond only DVT prophylaxis: Lovenox Vaccination status: We will need to discuss with Daniel Raymond whether he has received Covid vaccine  Foley catheter: No  HPI: Past medical history of IV drug abuse, tobacco abuse, hep C, asthma, chronic osteomyelitis presents with worsening neck pain. 06/2019 diagnosed with cervical osteomyelitis at [facility]. Left AMA. 9/18-26 admitted at [facility] for the same. Treated with IV vancomycin and Rocephin. Left AMA 9/26. 10/4 admitted at [facility]. Left AMA. 10/18-26 admitted at  [facility] again. Left AMA. Found to be using heroin in the hospital. Currently plan is continue IV antibiotics.   Subjective: Sleeping and did not awaken during exam  Objective: Vitals:   12/07/19 0357 12/07/19 0842  BP: 127/74 123/73  Pulse: 87 69  Resp: 19 17  Temp: 98.6 F (37 C) 97.9 F (36.6 C)  SpO2: 95% 99%    Intake/Output Summary (Last 24 hours) at 12/07/2019 0850 Last data filed at 12/06/2019 1800 Gross per 24 hour  Intake 240 ml  Output --  Net 240 ml   Filed Weights   11/17/19 1804 11/18/19 1507  Weight: 72.6 kg 72.6 kg    Exam:  Constitutional: NAD, awakened, calm Respiratory: clear to auscultation bilaterally, room air, normal respiratory effort.  Cardiovascular: Regular rate and rhythm, No extremity edema.  Abdomen: no tenderness, Bowel sounds positive.  Tolerating solid diet Musculoskeletal: No joint deformity upper and lower extremities. Good ROM,  Normal muscle tone.  Remains tender lateral /posterior neck Neurologic: Sleeping but at baseline CN 2-12 grossly intact. Sensation intact, DTR normal. Strength 5/5 x all 4 extremities.  Psychiatric: Sleeping but at baseline normal judgment and insight. Alert and oriented x 3. Normal mood.    Assessment/Plan: Acute problems: Cervical spine osteomyelitis/Septic arthritis -Presented following multiple recent hospitalizations after leaving AMA with continued neck pain.  -MR C-spine with abnormal signal/contrast C2--3 facet joint consistent with septic arthritis with no evidence of active discitis/osteomyelitis  but with chronic osteomyelitis C6/7.  -Remains afebrile -FU cultures have been negative -ED provider discussed case with neurosurgery PA, Kim who advised no intervention without epidural involvement and recommended medical management. -ID recommended 6 weeks of IV antibiotics (Rocephin and vancomycin) with projected end date 12/23/2019 -states 7 days of therapy given at [facility] facility  should be included in days of therapy -Continue IV Toradol and scheduled Robaxin  -Cont PT exercises as recommended -Repeat MRI on 11/12 demonstrates improvement in infectious physiology.  Other changes consistent with chronic interspinous ligament strain and injury also present and likely explains Daniel Raymond's ongoing pain syndrome  Polysubstance abuse -Daniel Raymond with long standing history of heroin dependence, has history of leaving AMA and returning to different hospital ED's.  -Recently started on Suboxone at [facility], but was discontinued as he was using heroin inpatient.  -Currently on Suboxone 8-2mg  PO BID wishes to continue after discharge.  Daniel Raymond will contact halfway house he plans to discharge to to determine if able to be prescribed Suboxone and stay there. --restrict visitation 2/2 history of using heroin during previous inpatient visit at outside hospital  Acute on chronic neck pain control. -Continue Suboxone as above. -Continue Robaxin, Toradol prn (last doses available on 11/16) and lidocaine patch     Other problems: Hepatitis C -Viral load 173,697 on 10/10/2019 at [facility].  -Is treatment nave due to ongoing IV drug abuse.  -HIV, Gonococcal and Chlamydia work-up negative. -Per ID will follow up outpatient.  Hepatitis C genotype and HCV FibroSure ordered by ID  Tobacco dependence -Counseled on need for complete cessation. --Nicotine patch  Limited IV access. -Secondary to history of IV drug abuse -Currently has single-lumen midline catheter in place  Nutrition Status: Nutrition Problem: Increased nutrient needs Etiology: acute illness (osteomyelitis of cervical spine) Signs/Symptoms: estimated needs Interventions: Ensure Enlive (each supplement provides 350kcal and 20 grams of protein), MVI Estimated body mass index is 22.97 kg/m as calculated from the following:   Height as of this encounter: 5\' 10"  (1.778 m).   Weight as of this  encounter: 72.6 kg.     Data Reviewed: Basic Metabolic Panel: Recent Labs  Lab 12/01/19 0343 12/03/19 0449 12/06/19 0407  NA 137 138 137  K 3.9 4.1 4.0  CL 103 102 101  CO2 27 28 25   GLUCOSE 106* 83 96  BUN 18 17 16   CREATININE 0.82 0.74 0.77  CALCIUM 9.4 10.0 9.5   Liver Function Tests: No results for input(s): AST, ALT, ALKPHOS, BILITOT, PROT, ALBUMIN in the last 168 hours. No results for input(s): LIPASE, AMYLASE in the last 168 hours. No results for input(s): AMMONIA in the last 168 hours. CBC: Recent Labs  Lab 12/06/19 0407  WBC 3.4*  NEUTROABS 1.3*  HGB 9.0*  HCT 27.8*  MCV 85.3  PLT 165   Cardiac Enzymes: No results for input(s): CKTOTAL, CKMB, CKMBINDEX, TROPONINI in the last 168 hours. BNP (last 3 results) No results for input(s): BNP in the last 8760 hours.  ProBNP (last 3 results) No results for input(s): PROBNP in the last 8760 hours.  CBG: No results for input(s): GLUCAP in the last 168 hours.  No results found for this or any previous visit (from the past 240 hour(s)).   Studies: No results found.  Scheduled Meds: . buprenorphine-naloxone  1 tablet Sublingual BID  . enoxaparin (LOVENOX) injection  40 mg Subcutaneous Q24H  . influenza vac split quadrivalent PF  0.5 mL Intramuscular Tomorrow-1000  . ketorolac  15 mg  Intravenous Q6H  . lidocaine  1 patch Transdermal Q24H  . methocarbamol  750 mg Oral QID  . multivitamin with minerals  1 tablet Oral Daily  . sodium chloride flush  10-40 mL Intracatheter Q12H  . sodium chloride flush  10-40 mL Intracatheter Q12H   Continuous Infusions: . cefTRIAXone (ROCEPHIN)  IV 2 g (12/07/19 0557)  . vancomycin 1,500 mg (12/07/19 0657)    Principal Problem:   Subacute osteomyelitis of cervical spine (HCC) Active Problems:   IV drug abuse (HCC)   Polysubstance dependence including opioid type drug with complication, episodic abuse, with unspecified complication (HCC)   Tobacco dependence   Hepatitis  C   Methamphetamine dependence, continuous (HCC)   Consultants:  Psychiatry  Infectious disease  Procedures:  Placement of midline IV catheter  Antibiotics: Anti-infectives (From admission, onward)   Start     Dose/Rate Route Frequency Ordered Stop   11/23/19 0600  cefTRIAXone (ROCEPHIN) 2 g in sodium chloride 0.9 % 100 mL IVPB        2 g 200 mL/hr over 30 Minutes Intravenous Every 24 hours 11/22/19 1135     11/20/19 0200  vancomycin (VANCOREADY) IVPB 1500 mg/300 mL        1,500 mg 150 mL/hr over 120 Minutes Intravenous Every 12 hours 11/19/19 1511     11/18/19 1615  Oritavancin Diphosphate (ORBACTIV) 1,200 mg in dextrose 5 % IVPB        1,200 mg 333.3 mL/hr over 180 Minutes Intravenous Once 11/18/19 1518 11/19/19 1907   11/18/19 1000  cefTRIAXone (ROCEPHIN) 2 g in sodium chloride 0.9 % 100 mL IVPB  Status:  Discontinued        2 g 200 mL/hr over 30 Minutes Intravenous Every 12 hours 11/17/19 2315 11/22/19 1135   11/17/19 2330  vancomycin (VANCOREADY) IVPB 1500 mg/300 mL  Status:  Discontinued        1,500 mg 150 mL/hr over 120 Minutes Intravenous Every 12 hours 11/17/19 2315 11/19/19 1511   11/17/19 2315  cefTRIAXone (ROCEPHIN) 1 g in sodium chloride 0.9 % 100 mL IVPB  Status:  Discontinued        1 g 200 mL/hr over 30 Minutes Intravenous Every 12 hours 11/17/19 2300 11/17/19 2315       Time spent: 10 minutes    Junious Silk ANP  [facility] Pager [phone]. If 7PM-7AM, please contact night-coverage at [email or web address] 12/07/2019, 8:50 AM  LOS: 19 days

## 2019-12-07 NOTE — Progress Notes (Addendum)
Pt has a Midline dressing due to be changed yesterday from charting appears patient said later.Asked patient this am if I can assess & change dressing and he said later- explained that line needs dressing changed weekly and all about the antimicrobial patch. Pt asked that IV team come back about 1230 today to change dressing.

## 2019-12-08 LAB — BASIC METABOLIC PANEL
Anion gap: 7 (ref 5–15)
BUN: 9 mg/dL (ref 6–20)
CO2: 27 mmol/L (ref 22–32)
Calcium: 9.5 mg/dL (ref 8.9–10.3)
Chloride: 106 mmol/L (ref 98–111)
Creatinine, Ser: 0.74 mg/dL (ref 0.61–1.24)
GFR, Estimated: >60 mL/min (ref >60)
Glucose, Bld: 95 mg/dL (ref 70–99)
Potassium: 4 mmol/L (ref 3.5–5.1)
Sodium: 140 mmol/L (ref 135–145)

## 2019-12-08 MED ORDER — CHLORHEXIDINE GLUCONATE CLOTH 2 % EX PADS
6.0000 | MEDICATED_PAD | Freq: Every day | CUTANEOUS | Status: DC
  Administered 2019-12-08: 6 via TOPICAL

## 2019-12-08 NOTE — Progress Notes (Addendum)
[facility] PROGRESS NOTE  Daniel Raymond WJX:[id number] DOB: Jul 22, 1985 DOA: 11/17/2019 PCP: Patient, No Pcp Per    Status: Remains inpatient appropriate because:Unsafe d/c plan and IV treatments appropriate due to intensity of illness or inability to take PO.  Requires IV antibiotics to treat osteomyelitis.  Unable to discharge home with IV line in place due to history of IV drug abuse with heroin.  Last dose IV antibiotics due 12/23/2019   Dispo: The patient is from: Home              Anticipated d/c is to: Home              Anticipated d/c date is: > 3 days (12/23/2019)              Patient currently is not medically stable to d/c.  Unsafe discharge plan in regards to requirement for IV antibiotics/PICC line in a patient with known IV drug abuse history   Code Status: Full Family Communication: Patient only DVT prophylaxis: Lovenox Vaccination status: We will need to discuss with patient whether he has received Covid vaccine  Foley catheter: No  HPI: Past medical history of IV drug abuse, tobacco abuse, hep C, asthma, chronic osteomyelitis presents with worsening neck pain. 06/2019 diagnosed with cervical osteomyelitis at [facility]. Left AMA. 9/18-26 admitted at [facility] for the same. Treated with IV vancomycin and Rocephin. Left AMA 9/26. 10/4 admitted at [facility]. Left AMA. 10/18-26 admitted at [facility] again. Left AMA. Found to be using heroin in the hospital. Currently plan is continue IV antibiotics.   Subjective: Awake.  No specific complaints.  Explained results of recent MRI C-spine to him.  At this point he is concerned over his financials.  Regarding discharge disposition he states one of the halfway houses accept patients on Suboxone therapy and the other one does not.  Objective: Vitals:   12/07/19 2106 12/08/19 0524  BP: 109/71 102/62  Pulse: 79 74  Resp: 17 18  Temp: 98.3 F (36.8 C) 97.8  F (36.6 C)  SpO2: 100% 100%    Intake/Output Summary (Last 24 hours) at 12/08/2019 1238 Last data filed at 12/08/2019 0400 Gross per 24 hour  Intake 2575.77 ml  Output --  Net 2575.77 ml   Filed Weights   11/17/19 1804 11/18/19 1507  Weight: 72.6 kg 72.6 kg    Exam:  Constitutional: NAD, awakened, calm Respiratory: clear to auscultation bilaterally, room air normal respiratory effort.  Cardiovascular: Regular rate and rhythm, No extremity edema.  Skin warm and dry and pink. Abdomen: no tenderness, Bowel sounds positive.  Tolerating solid diet Musculoskeletal: No joint deformity upper and lower extremities. Good ROM,  Normal muscle tone.   Neurologic: CN 2-12 grossly intact. Sensation intact, Strength 5/5 x all 4 extremities.  Psychiatric: Normal judgment and insight. Alert and oriented x 3. Normal mood.    Assessment/Plan: Acute problems: Cervical spine osteomyelitis/Septic arthritis -Presented following multiple recent hospitalizations after leaving AMA with continued neck pain.  -MR C-spine with abnormal signal/contrast C2--3 facet joint consistent with septic arthritis with no evidence of active discitis/osteomyelitis but with chronic osteomyelitis C6/7.  -FU cultures have been negative -ED provider discussed case with neurosurgery PA, Kim who advised no intervention without epidural involvement and recommended medical management. -ID recommended 6 weeks of IV antibiotics (Rocephin and vancomycin) with projected end date 12/23/2019 -states 7 days of therapy given at [facility] facility should be included in days of therapy -Continue  IV Toradol and scheduled Robaxin and cont PT exercises as recommended -Repeat MRI on 11/12 demonstrated improvement in infectious physiology.  Other changes consistent with chronic interspinous ligament strain and injury also present and likely explains patient's ongoing pain syndrome  Polysubstance abuse -Patient with long standing history of  heroin dependence, has history of leaving AMA and returning to different hospital ED's.  -Recently started on Suboxone at [facility], but was discontinued as he was using heroin inpatient.  -Currently on Suboxone 8-2mg  PO BID wishes to continue after discharge.  Patient will contact halfway house he plans to discharge to to determine if he is able to continue on Suboxone residing there.  He also will contact the [facility] treatment Associates or the [facility] treatment [facility] noting these are walk-in clinics that patient is familiar with that provide Suboxone treatment for $15 per day --restrict visitation 2/2 history of using heroin during previous inpatient visit at outside hospital-patient made aware of rationale for restricting visitors and verbalizes understanding  Acute on chronic neck pain control. -Continue Suboxone as above. -Continue Robaxin, Toradol prn (last doses available on 11/16) and lidocaine patch     Other problems: Hepatitis C -Viral load 173,697 on 10/10/2019 at [facility].  -Is treatment nave due to ongoing IV drug abuse.  -HIV, Gonococcal and Chlamydia work-up negative. -Per ID will follow up outpatient.  Hepatitis C genotype and HCV FibroSure ordered by ID  Tobacco dependence -Counseled on need for complete cessation. --Nicotine patch  Limited IV access. -Secondary to history of IV drug abuse -Currently has single-lumen midline catheter in place  Nutrition Status: Nutrition Problem: Increased nutrient needs Etiology: acute illness (osteomyelitis of cervical spine) Signs/Symptoms: estimated needs Interventions: Ensure Enlive (each supplement provides 350kcal and 20 grams of protein), MVI Estimated body mass index is 22.97 kg/m as calculated from the following:   Height as of this encounter: 5\' 10"  (1.778 m).   Weight as of this encounter: 72.6 kg.     Data Reviewed: Basic Metabolic Panel: Recent Labs  Lab 12/03/19 0449  12/06/19 0407 12/08/19 0435  NA 138 137 140  K 4.1 4.0 4.0  CL 102 101 106  CO2 28 25 27   GLUCOSE 83 96 95  BUN 17 16 9   CREATININE 0.74 0.77 0.74  CALCIUM 10.0 9.5 9.5   Liver Function Tests: No results for input(s): AST, ALT, ALKPHOS, BILITOT, PROT, ALBUMIN in the last 168 hours. No results for input(s): LIPASE, AMYLASE in the last 168 hours. No results for input(s): AMMONIA in the last 168 hours. CBC: Recent Labs  Lab 12/06/19 0407  WBC 3.4*  NEUTROABS 1.3*  HGB 9.0*  HCT 27.8*  MCV 85.3  PLT 165   Cardiac Enzymes: No results for input(s): CKTOTAL, CKMB, CKMBINDEX, TROPONINI in the last 168 hours. BNP (last 3 results) No results for input(s): BNP in the last 8760 hours.  ProBNP (last 3 results) No results for input(s): PROBNP in the last 8760 hours.  CBG: No results for input(s): GLUCAP in the last 168 hours.  No results found for this or any previous visit (from the past 240 hour(s)).   Studies: EKG SITE RITE  Result Date: 12/07/2019 If Site Rite image not attached, placement could not be confirmed due to current cardiac rhythm.   Scheduled Meds: . buprenorphine-naloxone  1 tablet Sublingual BID  . enoxaparin (LOVENOX) injection  40 mg Subcutaneous Q24H  . influenza vac split quadrivalent PF  0.5 mL Intramuscular Tomorrow-1000  . lidocaine  1  patch Transdermal Q24H  . methocarbamol  750 mg Oral QID  . multivitamin with minerals  1 tablet Oral Daily  . sodium chloride flush  10-40 mL Intracatheter Q12H  . sodium chloride flush  10-40 mL Intracatheter Q12H   Continuous Infusions: . sodium chloride 10 mL/hr at 12/08/19 0400  . cefTRIAXone (ROCEPHIN)  IV 2 g (12/08/19 0615)  . vancomycin 1,500 mg (12/08/19 0730)    Principal Problem:   Subacute osteomyelitis of cervical spine (HCC) Active Problems:   IV drug abuse (HCC)   Polysubstance dependence including opioid type drug with complication, episodic abuse, with unspecified complication (HCC)    Tobacco dependence   Hepatitis C   Methamphetamine dependence, continuous (HCC)   Consultants:  Psychiatry  Infectious disease  Procedures:  Placement of midline IV catheter  Antibiotics: Anti-infectives (From admission, onward)   Start     Dose/Rate Route Frequency Ordered Stop   11/23/19 0600  cefTRIAXone (ROCEPHIN) 2 g in sodium chloride 0.9 % 100 mL IVPB        2 g 200 mL/hr over 30 Minutes Intravenous Every 24 hours 11/22/19 1135     11/20/19 0200  vancomycin (VANCOREADY) IVPB 1500 mg/300 mL        1,500 mg 150 mL/hr over 120 Minutes Intravenous Every 12 hours 11/19/19 1511     11/18/19 1615  Oritavancin Diphosphate (ORBACTIV) 1,200 mg in dextrose 5 % IVPB        1,200 mg 333.3 mL/hr over 180 Minutes Intravenous Once 11/18/19 1518 11/19/19 1907   11/18/19 1000  cefTRIAXone (ROCEPHIN) 2 g in sodium chloride 0.9 % 100 mL IVPB  Status:  Discontinued        2 g 200 mL/hr over 30 Minutes Intravenous Every 12 hours 11/17/19 2315 11/22/19 1135   11/17/19 2330  vancomycin (VANCOREADY) IVPB 1500 mg/300 mL  Status:  Discontinued        1,500 mg 150 mL/hr over 120 Minutes Intravenous Every 12 hours 11/17/19 2315 11/19/19 1511   11/17/19 2315  cefTRIAXone (ROCEPHIN) 1 g in sodium chloride 0.9 % 100 mL IVPB  Status:  Discontinued        1 g 200 mL/hr over 30 Minutes Intravenous Every 12 hours 11/17/19 2300 11/17/19 2315       Time spent: 15 minutes    Junious Silk ANP  [facility] Pager [phone]. If 7PM-7AM, please contact night-coverage at [email or web address] 12/08/2019, 12:38 PM  LOS: 20 days     Supervising attending attestation Doing well.  No acute concern.   Discussed with charge nurse as well as patient.  As well as Charity fundraiser.  Not threatening to leave AMA right now. Informed the patient that he cannot leave AMA with a PICC line. Patient verbalized understanding. Patient was verbalized understanding regarding his restricted visitation status. Patient has been  cooperative during this hospital stay and was willing to continue remaining cooperative.  I have interviewed and examined the patient and agree with the documentation and management as recorded by the Advanced Practice Provider. I have made any necessary editorial changes.I have independently reviewed the clinical findings, lab results and imaging studies.  Author: Lynden Oxford, MD [facility] 12/08/2019 7:42 PM

## 2019-12-08 NOTE — Progress Notes (Signed)
Nutrition Follow-up  DOCUMENTATION CODES:   Not applicable  INTERVENTION:   -Continue MVI with minerals daily -Continue Magic cup TID with meals, each supplement provides 290 kcal and 9 grams of protein  NUTRITION DIAGNOSIS:   Increased nutrient needs related to acute illness (osteomyelitis of cervical spine) as evidenced by estimated needs.  Ongoing  GOAL:   Patient will meet greater than or equal to 90% of their needs  Progressing   MONITOR:   PO intake, Supplement acceptance, Labs, Weight trends, Skin, I & O's  REASON FOR ASSESSMENT:   Consult Assessment of nutrition requirement/status  ASSESSMENT:   Daniel Raymond is a 34 y.o. male with medical history significant of IVDA; tobacco dependence; Hep C; asthma; and osteomyelitis of the cervical spine presenting with neck pain.  Reviewed I/O's: +2.9 L x 24 hours and +19.8 L since 11/24/19  Pt remains with good appetite. Noted meal completions 100%.   Per ID notes, plan to continue with 6 week duration of IV antibiotics until 12/23/19.   Per RN documentation, last BM 12/01/19. Pt may benefit from addition of bowel regimen.   Medications reviewed and include suboxone, lovenox and rocephin.   Labs reviewed.   Diet Order:   Diet Order            Diet regular Room service appropriate? Yes; Fluid consistency: Thin  Diet effective now                 EDUCATION NEEDS:   No education needs have been identified at this time  Skin:  Skin Assessment: Reviewed RN Assessment  Last BM:  12/01/19  Height:   Ht Readings from Last 1 Encounters:  11/18/19 5\' 10"  (1.778 m)    Weight:   Wt Readings from Last 1 Encounters:  11/18/19 72.6 kg    Ideal Body Weight:  75.5 kg  BMI:  Body mass index is 22.97 kg/m.  Estimated Nutritional Needs:   Kcal:  2000-2200  Protein:  95-110 grams  Fluid:  > 2 L    11/20/19, RD, LDN, CDCES Registered Dietitian II Certified Diabetes Care and Education  Specialist Please refer to [facility] for RD and/or RD on-call/weekend/after hours pager

## 2019-12-08 NOTE — Progress Notes (Signed)
Peripherally Inserted Central Catheter Placement  The IV Nurse has discussed with the patient and/or persons authorized to consent for the patient, the purpose of this procedure and the potential benefits and risks involved with this procedure.  The benefits include less needle sticks, lab draws from the catheter, and the patient may be discharged home with the catheter. Risks include, but not limited to, infection, bleeding, blood clot (thrombus formation), and puncture of an artery; nerve damage and irregular heartbeat and possibility to perform a PICC exchange if needed/ordered by physician.  Alternatives to this procedure were also discussed.  Bard Power PICC patient education guide, fact sheet on infection prevention and patient information card has been provided to patient /or left at bedside.    PICC Placement Documentation  PICC Single Lumen 12/08/19 PICC Right Brachial 37 cm 0 cm (Active)  Indication for Insertion or Continuance of Line Prolonged intravenous therapies 12/08/19 1442  Exposed Catheter (cm) 0 cm 12/08/19 1442  Site Assessment Clean;Dry;Intact 12/08/19 1442  Line Status Flushed;Saline locked;Blood return noted 12/08/19 1442  Dressing Type Transparent;Securing device 12/08/19 1442  Dressing Status Clean;Dry;Intact 12/08/19 1442  Antimicrobial disc in place? Yes 12/08/19 1442  Dressing Intervention New dressing 12/08/19 1442  Dressing Change Due 12/15/19 12/08/19 1442       Daniel Raymond 12/08/2019, 2:44 PM

## 2019-12-08 NOTE — Progress Notes (Signed)
IV nurse came to Charge Nurse and reported that the patient appeared to be acting "different" since the last 2 times she has done his PICC line care- completely dressed in street clothes, he and a visitor have the couch in room turned around to the window area, he is walking around and states that he has not been up all day.  The patient answers questions appropriately but seems paranoid when asked about being dressed.  When questioned about his visitor and visitor control- he reports that he is going to leave [facility]

## 2019-12-09 MED ORDER — CHLORHEXIDINE GLUCONATE CLOTH 2 % EX PADS
6.0000 | MEDICATED_PAD | Freq: Every day | CUTANEOUS | Status: DC
  Administered 2019-12-09 – 2019-12-13 (×5): 6 via TOPICAL

## 2019-12-09 MED ORDER — HYDROXYZINE HCL 25 MG PO TABS
50.0000 mg | ORAL_TABLET | ORAL | Status: DC | PRN
  Administered 2019-12-11: 50 mg via ORAL
  Filled 2019-12-09: qty 2

## 2019-12-09 MED ORDER — PANTOPRAZOLE SODIUM 40 MG PO TBEC
40.0000 mg | DELAYED_RELEASE_TABLET | Freq: Every day | ORAL | Status: DC
  Administered 2019-12-09 – 2019-12-13 (×5): 40 mg via ORAL
  Filled 2019-12-09 (×5): qty 1

## 2019-12-09 NOTE — Progress Notes (Signed)
[facility] PROGRESS NOTE  Daniel Raymond IYM:[id number] DOB: 03/20/1985 DOA: 11/17/2019 PCP: Patient, No Pcp Per    Status: Remains inpatient appropriate because:Unsafe d/c plan and IV treatments appropriate due to intensity of illness or inability to take PO.  Requires IV antibiotics to treat osteomyelitis.  Unable to discharge home with IV line in place due to history of IV drug abuse with heroin.  Last dose IV antibiotics due 12/23/2019   Dispo: The patient is from: Home              Anticipated d/c is to: Home              Anticipated d/c date is: > 3 days (12/23/2019)              Patient currently is not medically stable to d/c.  Unsafe discharge plan in regards to requirement for IV antibiotics/PICC line in a patient with known IV drug abuse history   Code Status: Full Family Communication: Patient only DVT prophylaxis: Lovenox Vaccination status: We will need to discuss with patient whether he has received Covid vaccine  Foley catheter: No  HPI: Past medical history of IV drug abuse, tobacco abuse, hep C, asthma, chronic osteomyelitis presents with worsening neck pain. 06/2019 diagnosed with cervical osteomyelitis at [facility]. Left AMA. 9/18-26 admitted at [facility] for the same. Treated with IV vancomycin and Rocephin. Left AMA 9/26. 10/4 admitted at [facility]. Left AMA. 10/18-26 admitted at [facility] again. Left AMA. Found to be using heroin in the hospital. Currently plan is continue IV antibiotics.   Subjective: Awake.  Reclining on pullout sofa in room.  States currently this is more comfortable than his bed.  Reporting Robaxin giving him nausea and some mild abdominal discomfort.  Discussed patient's agitated behavior yesterday evening.  Of note patient had told me yesterday on rounds that he was looking at his financials.  Subsequently he found out he was $47,000 in debt (including medical bills)  and this is why he was so anxious and pacing around the room yesterday.  Objective: Vitals:   12/09/19 0412 12/09/19 0815  BP: 134/88 130/84  Pulse: (!) 59 69  Resp: 17 18  Temp: 98.5 F (36.9 C) 98.4 F (36.9 C)  SpO2: 100% 100%    Intake/Output Summary (Last 24 hours) at 12/09/2019 1226 Last data filed at 12/09/2019 0900 Gross per 24 hour  Intake 720 ml  Output --  Net 720 ml   Filed Weights   11/17/19 1804 11/18/19 1507  Weight: 72.6 kg 72.6 kg    Exam:  Constitutional: NAD, awake, not anxious Respiratory: clear to auscultation bilaterally, room air normal respiratory effort.  Cardiovascular: Regular rate and rhythm, No extremity edema.  Skin warm, dry and pink. Abdomen: no tenderness palpation, Bowel sounds positive.  Denies reflux. Musculoskeletal: No joint deformity upper and lower extremities. Good ROM,  Normal muscle tone.  Remains tender lateral /posterior neck Neurologic: CN 2-12 grossly intact. Sensation intact, DTR normal. Strength 5/5 x all 4 extremities.  Psychiatric: Normal judgment and insight. Alert and oriented x 3. Normal mood.    Assessment/Plan: Acute problems: Cervical spine osteomyelitis/Septic arthritis -Presented following multiple recent hospitalizations after leaving AMA with continued neck pain.  -MR C-spine with abnormal signal/contrast C2--3 facet joint consistent with septic arthritis with no evidence of active discitis/osteomyelitis but with chronic osteomyelitis C6/7.  -Remains afebrile -FU cultures have been negative -ED provider discussed case with neurosurgery PA, Selena Batten  who advised no intervention without epidural involvement and recommended medical management. -ID recommended 6 weeks of IV antibiotics (Rocephin and vancomycin) with projected end date 12/23/2019 -states 7 days of therapy given at [facility] facility should be included in days of therapy -Continue IV Toradol -discontinue Robaxin 2/2 GI complaints -Cont PT exercises as  recommended -Repeat MRI on 11/12 demonstrates improvement in infectious physiology.  Other changes consistent with chronic interspinous ligament strain and injury also present and likely explains patient's ongoing pain syndrome  Polysubstance abuse -Patient with long standing history of heroin dependence, has history of leaving AMA and returning to different hospital ED's.  -Recently started on Suboxone at [facility], but was discontinued as he was using heroin inpatient.  -Currently on Suboxone 8-2mg  PO BID wishes to continue after discharge.  Patient will contact halfway house he plans to discharge to to determine if he is able to continue on Suboxone residing there.  He also will contact the [facility] treatment Associates or the [facility] treatment Associates noting these are walk-in clinics that patient is familiar with that provide Suboxone treatment for $15 per day --restrict visitation 2/2 history of using heroin during previous inpatient visit at outside hospital-patient made aware of rationale for restricting visitors and verbalizes understanding -On the afternoon and on 11/17 patient demonstrated some agitated and restless behaviors evidenced by pacing.  Initially staff thought was related to visit her and possible patient ingesting of illegal substance.  Unfortunately patient found out he was $47,000 in after visit or assisted him with reviewing his financial records.  This was the reason why patient was agitated yesterday evening.  Agitation improved.  We will see if financial team has any options for patient to explore regarding that repayment assistance.  Acute on chronic neck pain control. -Continue Suboxone as above. -Continue Toradol prn (last doses available on 11/16) and lidocaine patch     Other problems: Hepatitis C -Viral load 173,697 on 10/10/2019 at [facility].  -Is treatment nave due to ongoing IV drug abuse.  -HIV, Gonococcal and Chlamydia work-up  negative. -Per ID will follow up outpatient.  Hepatitis C genotype and HCV FibroSure ordered by ID  Tobacco dependence -Counseled on need for complete cessation. --Nicotine patch  Limited IV access. -Secondary to history of IV drug abuse -Currently has single-lumen midline catheter in place  Nutrition Status: Nutrition Problem: Increased nutrient needs Etiology: acute illness (osteomyelitis of cervical spine) Signs/Symptoms: estimated needs Interventions: Ensure Enlive (each supplement provides 350kcal and 20 grams of protein), MVI Estimated body mass index is 22.97 kg/m as calculated from the following:   Height as of this encounter: 5\' 10"  (1.778 m).   Weight as of this encounter: 72.6 kg.     Data Reviewed: Basic Metabolic Panel: Recent Labs  Lab 12/03/19 0449 12/06/19 0407 12/08/19 0435  NA 138 137 140  K 4.1 4.0 4.0  CL 102 101 106  CO2 28 25 27   GLUCOSE 83 96 95  BUN 17 16 9   CREATININE 0.74 0.77 0.74  CALCIUM 10.0 9.5 9.5   Liver Function Tests: No results for input(s): AST, ALT, ALKPHOS, BILITOT, PROT, ALBUMIN in the last 168 hours. No results for input(s): LIPASE, AMYLASE in the last 168 hours. No results for input(s): AMMONIA in the last 168 hours. CBC: Recent Labs  Lab 12/06/19 0407  WBC 3.4*  NEUTROABS 1.3*  HGB 9.0*  HCT 27.8*  MCV 85.3  PLT 165   Cardiac Enzymes: No results for input(s): CKTOTAL, CKMB, CKMBINDEX, TROPONINI  in the last 168 hours. BNP (last 3 results) No results for input(s): BNP in the last 8760 hours.  ProBNP (last 3 results) No results for input(s): PROBNP in the last 8760 hours.  CBG: No results for input(s): GLUCAP in the last 168 hours.  No results found for this or any previous visit (from the past 240 hour(s)).   Studies: [place] EKG SITE RITE  Result Date: 12/07/2019 If Site Rite image not attached, placement could not be confirmed due to current cardiac rhythm.   Scheduled Meds:  buprenorphine-naloxone  1  tablet Sublingual BID   Chlorhexidine Gluconate Cloth  6 each Topical Daily   enoxaparin (LOVENOX) injection  40 mg Subcutaneous Q24H   influenza vac split quadrivalent PF  0.5 mL Intramuscular Tomorrow-1000   lidocaine  1 patch Transdermal Q24H   multivitamin with minerals  1 tablet Oral Daily   pantoprazole  40 mg Oral Daily   sodium chloride flush  10-40 mL Intracatheter Q12H   sodium chloride flush  10-40 mL Intracatheter Q12H   Continuous Infusions:  sodium chloride 10 mL/hr at 12/08/19 0400   cefTRIAXone (ROCEPHIN)  IV 2 g (12/09/19 0554)   vancomycin 1,500 mg (12/09/19 0719)    Principal Problem:   Subacute osteomyelitis of cervical spine (HCC) Active Problems:   IV drug abuse (HCC)   Polysubstance dependence including opioid type drug with complication, episodic abuse, with unspecified complication (HCC)   Tobacco dependence   Hepatitis C   Methamphetamine dependence, continuous (HCC)   Consultants:  Psychiatry  Infectious disease  Procedures:  Placement of midline IV catheter  Antibiotics: Anti-infectives (From admission, onward)   Start     Dose/Rate Route Frequency Ordered Stop   11/23/19 0600  cefTRIAXone (ROCEPHIN) 2 g in sodium chloride 0.9 % 100 mL IVPB        2 g 200 mL/hr over 30 Minutes Intravenous Every 24 hours 11/22/19 1135     11/20/19 0200  vancomycin (VANCOREADY) IVPB 1500 mg/300 mL        1,500 mg 150 mL/hr over 120 Minutes Intravenous Every 12 hours 11/19/19 1511     11/18/19 1615  Oritavancin Diphosphate (ORBACTIV) 1,200 mg in dextrose 5 % IVPB        1,200 mg 333.3 mL/hr over 180 Minutes Intravenous Once 11/18/19 1518 11/19/19 1907   11/18/19 1000  cefTRIAXone (ROCEPHIN) 2 g in sodium chloride 0.9 % 100 mL IVPB  Status:  Discontinued        2 g 200 mL/hr over 30 Minutes Intravenous Every 12 hours 11/17/19 2315 11/22/19 1135   11/17/19 2330  vancomycin (VANCOREADY) IVPB 1500 mg/300 mL  Status:  Discontinued        1,500 mg 150  mL/hr over 120 Minutes Intravenous Every 12 hours 11/17/19 2315 11/19/19 1511   11/17/19 2315  cefTRIAXone (ROCEPHIN) 1 g in sodium chloride 0.9 % 100 mL IVPB  Status:  Discontinued        1 g 200 mL/hr over 30 Minutes Intravenous Every 12 hours 11/17/19 2300 11/17/19 2315       Time spent: 15 minutes    Junious Silk ANP  [facility] Pager [phone]. If 7PM-7AM, please contact night-coverage at [email or web address] 12/09/2019, 12:26 PM  LOS: 21 days

## 2019-12-09 NOTE — Progress Notes (Signed)
Patient exhibiting hyper behavior , a change from prior two nights that I've taken care of him. Has all of his art pencils spread out on bed and constantly pacing around room while talking on his cell phone. Had visitor earlier and became upset when told by dayshift nurse that she would have to leave and threatened to go AMA.. Matter resolved by the beginning of my shift. However as I noted patient is in a hyper active state . Notified on call physician and informed her of situation orders were to continue to monitor patient and notify if behavior becomes worse.

## 2019-12-10 LAB — VANCOMYCIN, TROUGH: Vancomycin Tr: 12 ug/mL — ABNORMAL LOW (ref 15–20)

## 2019-12-10 MED ORDER — VANCOMYCIN HCL 1750 MG/350ML IV SOLN
1750.0000 mg | Freq: Two times a day (BID) | INTRAVENOUS | Status: DC
  Administered 2019-12-10 – 2019-12-13 (×7): 1750 mg via INTRAVENOUS
  Filled 2019-12-10 (×8): qty 350

## 2019-12-10 NOTE — Progress Notes (Signed)
[facility] PROGRESS NOTE  Daniel Raymond LKG:[id number] DOB: 1985/06/30 DOA: 11/17/2019 PCP: Patient, No Pcp Per    Status: Remains inpatient appropriate because:Unsafe d/c plan and IV treatments appropriate due to intensity of illness or inability to take PO.  Requires IV antibiotics to treat osteomyelitis.  Unable to discharge home with IV line in place due to history of IV drug abuse with heroin.  Last dose IV antibiotics due 12/23/2019   Dispo: The patient is from: Home              Anticipated d/c is to: Home              Anticipated d/c date is: > 3 days (12/23/2019)              Patient currently is not medically stable to d/c.  Unsafe discharge plan in regards to requirement for IV antibiotics/PICC line in a patient with known IV drug abuse history   Code Status: Full Family Communication: Patient only DVT prophylaxis: Lovenox Vaccination status: We will need to discuss with patient whether he has received Covid vaccine  Foley catheter: No  HPI: Past medical history of IV drug abuse, tobacco abuse, hep C, asthma, chronic osteomyelitis presents with worsening neck pain. 06/2019 diagnosed with cervical osteomyelitis at [facility]. Left AMA. 9/18-26 admitted at [facility] for the same. Treated with IV vancomycin and Rocephin. Left AMA 9/26. 10/4 admitted at [facility]. Left AMA. 10/18-26 admitted at [facility] again. Left AMA. Found to be using heroin in the hospital. Currently plan is continue IV antibiotics.   Subjective: Sitting in chair at [facility] forcing eating breakfast and watching television.  No specific complaints.  Objective: Vitals:   12/10/19 0307 12/10/19 0759  BP: 107/67 110/69  Pulse: 67 82  Resp: 15 18  Temp: 99.1 F (37.3 C) 98.2 F (36.8 C)  SpO2: 100% 100%   No intake or output data in the 24 hours ending 12/10/19 1311 Filed Weights   11/17/19 1804 11/18/19 1507  Weight: 72.6 kg 72.6  kg    Exam:  Constitutional: NAD, awake, not anxious Respiratory: clear to auscultation bilaterally, room air normal respiratory effort.  Cardiovascular: Regular rate and rhythm, No extremity edema.  Skin warm, dry and pink. Abdomen: non tender, Bowel sounds positive.  Tolerating breakfast tray without reports of discomfort. Musculoskeletal: No joint deformity upper and lower extremities. Good ROM,  Normal muscle tone.  Remains tender lateral /posterior neck Neurologic: CN 2-12 grossly intact. Sensation intact, DTR normal. Strength 5/5 x all 4 extremities.  Psychiatric: Normal judgment and insight. Alert and oriented x 3. Normal mood.    Assessment/Plan: Acute problems: Cervical spine osteomyelitis/Septic arthritis -Presented following multiple recent hospitalizations after leaving AMA with continued neck pain.  -MR C-spine with abnormal signal/contrast C2--3 facet joint consistent with septic arthritis with no evidence of active discitis/osteomyelitis but with chronic osteomyelitis C6/7.  -FU cultures have been negative -ED provider discussed case with neurosurgery PA, Kim who advised no intervention without epidural involvement and recommended medical management. -ID recommended 6 weeks of IV antibiotics (Rocephin and vancomycin) with projected end date 12/23/2019 -states 7 days of therapy given at [facility] facility should be included in days of therapy -Completed IV Toradol x 5 days-discontinued Robaxin 2/2 GI complaints -Cont PT exercises as recommended-patient reporting has helped with neck discomfort -Repeat MRI on 11/12 demonstrated improvement in infectious physiology.  Other changes consistent with chronic interspinous ligament strain and injury also  present and likely explains patient's ongoing pain syndrome  Polysubstance abuse -Patient with long standing history of heroin dependence, has history of leaving AMA and returning to different hospital ED's.  -Recently started on  Suboxone at [facility], but was discontinued as he was using heroin inpatient.  -Currently on Suboxone 8-2mg  PO BID wishes to continue after discharge.  Patient will contact halfway house he plans to discharge to to determine if he is able to continue on Suboxone residing there.  He also will contact the [facility] treatment Associates or the [facility] treatment Associates noting these are walk-in clinics that patient is familiar with that provide Suboxone treatment for $15 per day --restrict visitation 2/2 history of using heroin during previous inpatient visit at outside hospital-patient made aware of rationale for restricting visitors and verbalizes understanding  Acute on chronic neck pain control. -Continue Suboxone as above. -Continue Toradol prn (last doses available on 11/16) and lidocaine patch     Other problems: Hepatitis C -Viral load 173,697 on 10/10/2019 at [facility].  -Is treatment nave due to ongoing IV drug abuse.  -HIV, Gonococcal and Chlamydia work-up negative. -Per ID will follow up outpatient.  Hepatitis C genotype and HCV FibroSure ordered by ID  Tobacco dependence -Counseled on need for complete cessation. --Nicotine patch  Limited IV access. -Secondary to history of IV drug abuse -Currently has single-lumen midline catheter in place  Nutrition Status: Nutrition Problem: Increased nutrient needs Etiology: acute illness (osteomyelitis of cervical spine) Signs/Symptoms: estimated needs Interventions: Ensure Enlive (each supplement provides 350kcal and 20 grams of protein), MVI Estimated body mass index is 22.97 kg/m as calculated from the following:   Height as of this encounter: 5\' 10"  (1.778 m).   Weight as of this encounter: 72.6 kg.     Data Reviewed: Basic Metabolic Panel: Recent Labs  Lab 12/06/19 0407 12/08/19 0435  NA 137 140  K 4.0 4.0  CL 101 106  CO2 25 27  GLUCOSE 96 95  BUN 16 9  CREATININE 0.77 0.74  CALCIUM  9.5 9.5   Liver Function Tests: No results for input(s): AST, ALT, ALKPHOS, BILITOT, PROT, ALBUMIN in the last 168 hours. No results for input(s): LIPASE, AMYLASE in the last 168 hours. No results for input(s): AMMONIA in the last 168 hours. CBC: Recent Labs  Lab 12/06/19 0407  WBC 3.4*  NEUTROABS 1.3*  HGB 9.0*  HCT 27.8*  MCV 85.3  PLT 165   Cardiac Enzymes: No results for input(s): CKTOTAL, CKMB, CKMBINDEX, TROPONINI in the last 168 hours. BNP (last 3 results) No results for input(s): BNP in the last 8760 hours.  ProBNP (last 3 results) No results for input(s): PROBNP in the last 8760 hours.  CBG: No results for input(s): GLUCAP in the last 168 hours.  No results found for this or any previous visit (from the past 240 hour(s)).   Studies: No results found.  Scheduled Meds:  buprenorphine-naloxone  1 tablet Sublingual BID   Chlorhexidine Gluconate Cloth  6 each Topical Daily   enoxaparin (LOVENOX) injection  40 mg Subcutaneous Q24H   influenza vac split quadrivalent PF  0.5 mL Intramuscular Tomorrow-1000   lidocaine  1 patch Transdermal Q24H   multivitamin with minerals  1 tablet Oral Daily   pantoprazole  40 mg Oral Daily   sodium chloride flush  10-40 mL Intracatheter Q12H   sodium chloride flush  10-40 mL Intracatheter Q12H   Continuous Infusions:  sodium chloride 10 mL/hr at 12/08/19 0400  cefTRIAXone (ROCEPHIN)  IV 2 g (12/10/19 0524)   vancomycin      Principal Problem:   Subacute osteomyelitis of cervical spine (HCC) Active Problems:   IV drug abuse (HCC)   Polysubstance dependence including opioid type drug with complication, episodic abuse, with unspecified complication (HCC)   Tobacco dependence   Hepatitis C   Methamphetamine dependence, continuous (HCC)   Consultants:  Psychiatry  Infectious disease  Procedures:  Placement of midline IV catheter  Antibiotics: Anti-infectives (From admission, onward)   Start      Dose/Rate Route Frequency Ordered Stop   12/10/19 1800  vancomycin (VANCOREADY) IVPB 1750 mg/350 mL        1,750 mg 175 mL/hr over 120 Minutes Intravenous Every 12 hours 12/10/19 1104     11/23/19 0600  cefTRIAXone (ROCEPHIN) 2 g in sodium chloride 0.9 % 100 mL IVPB        2 g 200 mL/hr over 30 Minutes Intravenous Every 24 hours 11/22/19 1135     11/20/19 0200  vancomycin (VANCOREADY) IVPB 1500 mg/300 mL  Status:  Discontinued        1,500 mg 150 mL/hr over 120 Minutes Intravenous Every 12 hours 11/19/19 1511 12/10/19 1104   11/18/19 1615  Oritavancin Diphosphate (ORBACTIV) 1,200 mg in dextrose 5 % IVPB        1,200 mg 333.3 mL/hr over 180 Minutes Intravenous Once 11/18/19 1518 11/19/19 1907   11/18/19 1000  cefTRIAXone (ROCEPHIN) 2 g in sodium chloride 0.9 % 100 mL IVPB  Status:  Discontinued        2 g 200 mL/hr over 30 Minutes Intravenous Every 12 hours 11/17/19 2315 11/22/19 1135   11/17/19 2330  vancomycin (VANCOREADY) IVPB 1500 mg/300 mL  Status:  Discontinued        1,500 mg 150 mL/hr over 120 Minutes Intravenous Every 12 hours 11/17/19 2315 11/19/19 1511   11/17/19 2315  cefTRIAXone (ROCEPHIN) 1 g in sodium chloride 0.9 % 100 mL IVPB  Status:  Discontinued        1 g 200 mL/hr over 30 Minutes Intravenous Every 12 hours 11/17/19 2300 11/17/19 2315       Time spent: 15 minutes    Junious Silk ANP  [facility] Pager [phone]. If 7PM-7AM, please contact night-coverage at [email or web address] 12/10/2019, 1:11 PM  LOS: 22 days

## 2019-12-10 NOTE — Progress Notes (Signed)
Pharmacy Antibiotic Note  Daniel Raymond is a 34 y.o. male admitted on 11/17/2019 with discitis, left outside hospital AMA.  Pharmacy has been consulted for vancomycin dosing.    Repeat vancomycin level is sub-therapeutic at 12 mcg/mL (goal 15-20 mcg/mL).  Renal function stable, afebrile, WBC WNL.  Plan: Increase vanc to 1750mg  IV Q12H Continue CTX 2gm IV Q24H Monitor renal fxn, clinical progress, weekly vanc trough  Height: 5\' 10"  (177.8 cm) Weight: 72.6 kg (160 lb 0.9 oz) IBW/kg (Calculated) : 73  Temp (24hrs), Avg:98.6 F (37 C), Min:98.2 F (36.8 C), Max:99.1 F (37.3 C)  Recent Labs  Lab 12/04/19 0530 12/06/19 0407 12/08/19 0435 12/10/19 0517  WBC  --  3.4*  --   --   CREATININE  --  0.77 0.74  --   VANCOTROUGH 16  --   --  12*    Estimated Creatinine Clearance: 133.6 mL/min (by C-G formula based on SCr of 0.74 mg/dL).    Allergies  Allergen Reactions  . Ibuprofen Other (See Comments)    Stomach pains    Vanc 10/17 >> (11/28 per ID, including the 7d at [facility]) CTX 10/17 >>  Oritavancin x1 10/28  10/30 VT 15 (drawn late) >> cont 1500mg  q12 11/3 VT 17 >> no change  11/9 VT 13 >> no change 11/12 VT 13 (>2 hrs delayed) >> no change, repeat labs in AM 11/13 VT 16 >> no change 11/19 VT 12 >> incr to 1750mg  q12  10/29 BCx - negative  Maurina Fawaz D. 12/13, PharmD, BCPS, BCCCP 12/10/2019, 11:04 AM

## 2019-12-10 NOTE — TOC Progression Note (Signed)
Transition of Care [facility]) - Progression Note    Patient Details  Name: Daniel Raymond MRN: [id number] Date of Birth: 04/15/1985  [facility] CM/SW Contact  Jimmy Picket, [place] Phone Number: 12/10/2019, 3:17 PM  Clinical Narrative:     CSW spoke to pt about his discharge plan. CSW gave pt resources for shelters in the area and [place] [facility]. CSW explained that [facility] are sober living  Communities and is unsure if they will take pt on suboxone. CSW gave pt [place] and [employer] contacts numbers and encouraged pt to make the calls about admission criteria. CSW also explained that there is rent and chores involved. Pt reports he has a job lined up so he should be able to pay rent.   CSW and pt also discussed treatment facilities that accept suboxone. CSW encouraged pt to call and find out admission days and times so that they can coordinate care to not have a lapse in treatment.   TOC will follow.  Expected Discharge Plan: Home/Self Care Barriers to Discharge: Continued Medical Work up  Expected Discharge Plan and Services Expected Discharge Plan: Home/Self Care       Living arrangements for the past 2 months: Homeless                                       Social Determinants of Health (SDOH) Interventions    Readmission Risk Interventions No flowsheet data found.  Jimmy Picket, Theresia Majors, [place] Clinical Social Worker [phone]

## 2019-12-11 NOTE — Progress Notes (Signed)
[facility] Progress Note  Patient: Daniel Raymond    ERX:[id number]  DOA: 11/17/2019     Date of Service: the patient was seen and examined on 12/11/2019  Brief hospital course: Past medical history of IV drug abuse, tobacco abuse, hep C, asthma, chronic osteomyelitis presents with worsening neck pain. 06/2019 diagnosed with cervical osteomyelitis at [facility].  Left AMA. 9/18-26 admitted at [facility] for the same.  Treated with IV vancomycin and Rocephin.  Left AMA 9/26. 10/4 admitted at [facility].  Left AMA. 10/18-26 admitted at [facility] again.  Left AMA.  Found to be using heroin in the hospital. Currently plan is continue IV antibiotics till 12/23/2019.  Assessment and Plan: Cervical spine osteomyelitis Septic arthritis Patient presenting following multiple recent hospitalizations after leaving AMA with continued neck pain. MR C-spine with abnormal signal/contrast C2--3 facet joint consistent with septic arthritis with no evidence of active discitis/osteomyelitis but with chronic osteomyelitis C6/7.  Patient is afebrile without leukocytosis.  ESR 27, CRP 0.9.  ED provider discussed case with neurosurgery PA, Kim who advised no intervention without epidural involvement and recommended medical management. QuantiFERON gold:Negative Blood cultures x2: no growth x 5 days Infectious disease following, appreciate assistance, recommending 6 weeks of IV antibiotics.  Projected end date 12/23/2019. Repeat MRI 12/03/2019 shows improvement in infectious physiology.  Hepatitis C genotype 1a. Viral load 173,697 on 10/10/2019 at [facility].  Is treatment nave due to ongoing IV drug abuse.  HIV, Gonococcal and Chlamydia work-up negative. Per ID will follow up outpatient.  Polysubstance abuse Patient with long standing history of heroin dependence, has history of leaving AMA and returning to different hospital  ED's.  Recently started on Suboxone at [facility], but was discontinued as he was using heroin inpatient.  He currently desires to quit. --Suboxone 8-46m PO BID, patient now wants to continue Suboxone on discharge. Received information regarding rehab options as well as Suboxone therapy option. Currently also has visit with restriction.  Tobacco dependence Counseled on need for complete cessation. --Nicotine patch  Limited IV access. Has a PICC line now. Cannot leave hospital with a PICC line.  Body mass index is 22.97 kg/m.  Nutrition Problem: Increased nutrient needs Etiology: acute illness (osteomyelitis of cervical spine) Interventions: Interventions: Ensure Enlive (each supplement provides 350kcal and 20 grams of protein), MVI  Diet: Regular diet DVT Prophylaxis:   enoxaparin (LOVENOX) injection 40 mg Start: 11/18/19 1000  Advance goals of care discussion: Full code  Family Communication: no family was present at bedside, at the time of interview.   Disposition:  Status is: Inpatient  Remains inpatient appropriate because:Inpatient level of care appropriate due to severity of illness  Dispo: The patient is from: Home              Anticipated d/c is to: Home              Anticipated d/c date is: > 3 days              Patient currently is not medically stable to d/c.  Subjective: No acute complaint.  No fever no chills.  No neck pain.  Physical Exam:  General: Appear in mild distress, no Rash; Oral Mucosa Clear, moist. no Abnormal Neck Mass Or lumps, Conjunctiva normal  Cardiovascular: S1 and S2 Present, no Murmur, Respiratory: good respiratory effort, Bilateral Air entry present and CTA, no Crackles, no wheezes Abdomen: Bowel Sound present, Soft and no tenderness Extremities: no  Pedal edema Neurology: alert and oriented to time, place, and person affect appropriate. no new focal deficit Gait not checked due to patient safety concerns  Vitals:   12/10/19  2058 12/11/19 0606 12/11/19 0927 12/11/19 1525  BP: 105/64 123/86 102/69 106/70  Pulse: 74 77 67 81  Resp: _0 Temp: 97.7 F (36.5 C) (!) 97.3 F (36.3 C) (!) 97.5 F (36.4 C) 98.2 F (36.8 C)  TempSrc: Oral Oral Oral Oral  SpO2: 99% 100% 98% 96%  Weight:      Height:       No intake or output data in the 24 hours ending 12/11/19 1741 Filed Weights   11/17/19 1804 11/18/19 1507  Weight: 72.6 kg 72.6 kg    Data Reviewed: I have personally reviewed and interpreted daily labs, tele strips, imagings as discussed above. I reviewed all nursing notes, pharmacy notes, vitals, pertinent old records I have discussed plan of care as described above with RN and patient/family.  CBC: Recent Labs  Lab 12/06/19 0407  WBC 3.4*  NEUTROABS 1.3*  HGB 9.0*  HCT 27.8*  MCV 85.3  PLT 903   Basic Metabolic Panel: Recent Labs  Lab 12/06/19 0407 12/08/19 0435  NA 137 140  K 4.0 4.0  CL 101 106  CO2 25 27  GLUCOSE 96 95  BUN 16 9  CREATININE 0.77 0.74  CALCIUM 9.5 9.5    Studies: No results found.  Scheduled Meds:  buprenorphine-naloxone  1 tablet Sublingual BID   Chlorhexidine Gluconate Cloth  6 each Topical Daily   enoxaparin (LOVENOX) injection  40 mg Subcutaneous Q24H   influenza vac split quadrivalent PF  0.5 mL Intramuscular Tomorrow-1000   lidocaine  1 patch Transdermal Q24H   multivitamin with minerals  1 tablet Oral Daily   pantoprazole  40 mg Oral Daily   sodium chloride flush  10-40 mL Intracatheter Q12H   sodium chloride flush  10-40 mL Intracatheter Q12H   Continuous Infusions:  sodium chloride 10 mL/hr at 12/08/19 0400   cefTRIAXone (ROCEPHIN)  IV 2 g (12/11/19 0525)   vancomycin 1,750 mg (12/11/19 1735)   PRN Meds: sodium chloride, acetaminophen **OR** acetaminophen, albuterol, hydrALAZINE, hydrOXYzine, nicotine, ondansetron **OR** ondansetron (ZOFRAN) IV, sodium chloride flush, sodium chloride flush  Time spent: 35  minutes  Author: Berle Mull, MD [facility] 12/11/2019 5:41 PM  To reach On-call, see care teams to locate the attending and reach out via [email or web address]. Between 7PM-7AM, please contact night-coverage If you still have difficulty reaching the attending provider, please page the [facility] (Director on Call) for [facility] on amion for assistance.

## 2019-12-12 NOTE — Progress Notes (Signed)
[facility] Progress Note  Patient: Daniel Raymond    XHB:[id number]  DOA: 11/17/2019     Date of Service: the patient was seen and examined on 12/12/2019  Brief hospital course: Past medical history of IV drug abuse, tobacco abuse, hep C, asthma, chronic osteomyelitis presents with worsening neck pain. 06/2019 diagnosed with cervical osteomyelitis at [facility].  Left AMA. 9/18-26 admitted at [facility] for the same.  Treated with IV vancomycin and Rocephin.  Left AMA 9/26. 10/4 admitted at [facility].  Left AMA. 10/18-26 admitted at [facility] again.  Left AMA.  Found to be using heroin in the hospital. Currently plan is continue IV antibiotics till 12/23/2019. No acute issues on 12/12/2019.  Assessment and Plan: Cervical spine osteomyelitis Septic arthritis Patient presenting following multiple recent hospitalizations after leaving AMA with continued neck pain. MR C-spine with abnormal signal/contrast C2--3 facet joint consistent with septic arthritis with no evidence of active discitis/osteomyelitis but with chronic osteomyelitis C6/7.  Patient is afebrile without leukocytosis.  ESR 27, CRP 0.9.  ED provider discussed case with neurosurgery PA, Kim who advised no intervention without epidural involvement and recommended medical management. QuantiFERON gold:Negative Blood cultures x2: no growth x 5 days Infectious disease following, appreciate assistance, recommending 6 weeks of IV antibiotics.  Projected end date 12/23/2019. Repeat MRI 12/03/2019 shows improvement in infectious physiology.  Hepatitis C genotype 1a. Viral load 173,697 on 10/10/2019 at [facility].  Is treatment nave due to ongoing IV drug abuse.  HIV, Gonococcal and Chlamydia work-up negative. Per ID will follow up outpatient.  Polysubstance abuse Patient with long standing history of heroin dependence, has history of leaving AMA and  returning to different hospital ED's.  Recently started on Suboxone at [facility], but was discontinued as he was using heroin inpatient.  He currently desires to quit. --Suboxone 8-92m PO BID, patient now wants to continue Suboxone on discharge. Received information regarding rehab options as well as Suboxone therapy option. Currently also has visit with restriction.  Tobacco dependence Counseled on need for complete cessation. --Nicotine patch  Limited IV access. Has a PICC line now. Cannot leave hospital with a PICC line.  Body mass index is 22.97 kg/m.  Nutrition Problem: Increased nutrient needs Etiology: acute illness (osteomyelitis of cervical spine) Interventions: Interventions: Ensure Enlive (each supplement provides 350kcal and 20 grams of protein), MVI  Diet: Regular diet DVT Prophylaxis:   enoxaparin (LOVENOX) injection 40 mg Start: 11/18/19 1000  Advance goals of care discussion: Full code  Family Communication: no family was present at bedside, at the time of interview.   Disposition:  Status is: Inpatient  Remains inpatient appropriate because:Inpatient level of care appropriate due to severity of illness  Dispo: The patient is from: Home              Anticipated d/c is to: Home              Anticipated d/c date is: > 3 days              Patient currently is not medically stable to d/c.  Subjective: Denies any pain.  Denies any nausea or vomiting.  No fever no chills.  No diarrhea.  Oral intake adequate.  No sleep issues.  Physical Exam:  General: Appear in no distress, Neurology: alert and oriented to time, place, and person affect appropriate. no new focal deficit Gait not checked due to patient safety concerns  Vitals:   12/11/19  2001 12/12/19 0449 12/12/19 0749 12/12/19 1355  BP: 112/68 107/71 110/73 115/69  Pulse: 81 67 90 89  Resp: 18 18 18 17  Temp: (!) 97.4 F (36.3 C) 98.4 F (36.9 C) 98.3 F (36.8 C) 98.2 F (36.8 C)  TempSrc:  Oral Oral Oral Oral  SpO2: 100% 99% 100% 100%  Weight:      Height:        Intake/Output Summary (Last 24 hours) at 12/12/2019 2046 Last data filed at 12/12/2019 1900 Gross per 24 hour  Intake 1320 ml  Output --  Net 1320 ml   Filed Weights   11/17/19 1804 11/18/19 1507  Weight: 72.6 kg 72.6 kg    Data Reviewed: I have personally reviewed and interpreted daily labs, tele strips, imagings as discussed above. I reviewed all nursing notes, pharmacy notes, vitals, pertinent old records I have discussed plan of care as described above with RN and patient/family.  CBC: Recent Labs  Lab 12/06/19 0407  WBC 3.4*  NEUTROABS 1.3*  HGB 9.0*  HCT 27.8*  MCV 85.3  PLT 165   Basic Metabolic Panel: Recent Labs  Lab 12/06/19 0407 12/08/19 0435  NA 137 140  K 4.0 4.0  CL 101 106  CO2 25 27  GLUCOSE 96 95  BUN 16 9  CREATININE 0.77 0.74  CALCIUM 9.5 9.5    Studies: No results found.  Scheduled Meds: . buprenorphine-naloxone  1 tablet Sublingual BID  . Chlorhexidine Gluconate Cloth  6 each Topical Daily  . enoxaparin (LOVENOX) injection  40 mg Subcutaneous Q24H  . influenza vac split quadrivalent PF  0.5 mL Intramuscular Tomorrow-1000  . lidocaine  1 patch Transdermal Q24H  . multivitamin with minerals  1 tablet Oral Daily  . pantoprazole  40 mg Oral Daily  . sodium chloride flush  10-40 mL Intracatheter Q12H  . sodium chloride flush  10-40 mL Intracatheter Q12H   Continuous Infusions: . sodium chloride 10 mL/hr at 12/08/19 0400  . cefTRIAXone (ROCEPHIN)  IV 2 g (12/12/19 0530)  . vancomycin 1,750 mg (12/12/19 1712)   PRN Meds: sodium chloride, acetaminophen **OR** acetaminophen, albuterol, hydrALAZINE, hydrOXYzine, nicotine, ondansetron **OR** ondansetron (ZOFRAN) IV, sodium chloride flush, sodium chloride flush  Time spent: 35 minutes  Author:  , MD [facility] 12/12/2019 8:46 PM  To reach On-call, see care teams to locate the attending and  reach out via [email or web address]. Between 7PM-7AM, please contact night-coverage If you still have difficulty reaching the attending provider, please page the [facility] (Director on Call) for [facility] on amion for assistance.  

## 2019-12-13 ENCOUNTER — Other Ambulatory Visit (HOSPITAL_COMMUNITY): Payer: Self-pay | Admitting: Internal Medicine

## 2019-12-13 LAB — CBC WITH DIFFERENTIAL/PLATELET
Abs Immature Granulocytes: 0.01 10*3/uL (ref 0.00–0.07)
Basophils Absolute: 0 10*3/uL (ref 0.0–0.1)
Basophils Relative: 1 %
Eosinophils Absolute: 0.2 10*3/uL (ref 0.0–0.5)
Eosinophils Relative: 6 %
HCT: 32.6 % — ABNORMAL LOW (ref 39.0–52.0)
Hemoglobin: 10.9 g/dL — ABNORMAL LOW (ref 13.0–17.0)
Immature Granulocytes: 0 %
Lymphocytes Relative: 42 %
Lymphs Abs: 1.7 10*3/uL (ref 0.7–4.0)
MCH: 28.6 pg (ref 26.0–34.0)
MCHC: 33.4 g/dL (ref 30.0–36.0)
MCV: 85.6 fL (ref 80.0–100.0)
Monocytes Absolute: 0.5 10*3/uL (ref 0.1–1.0)
Monocytes Relative: 13 %
Neutro Abs: 1.5 10*3/uL — ABNORMAL LOW (ref 1.7–7.7)
Neutrophils Relative %: 38 %
Platelets: 190 10*3/uL (ref 150–400)
RBC: 3.81 MIL/uL — ABNORMAL LOW (ref 4.22–5.81)
RDW: 15.3 % (ref 11.5–15.5)
WBC: 4 10*3/uL (ref 4.0–10.5)
nRBC: 0 % (ref 0.0–0.2)

## 2019-12-13 LAB — BASIC METABOLIC PANEL
Anion gap: 8 (ref 5–15)
BUN: 15 mg/dL (ref 6–20)
CO2: 26 mmol/L (ref 22–32)
Calcium: 9.6 mg/dL (ref 8.9–10.3)
Chloride: 103 mmol/L (ref 98–111)
Creatinine, Ser: 0.73 mg/dL (ref 0.61–1.24)
GFR, Estimated: >60 mL/min (ref >60)
Glucose, Bld: 98 mg/dL (ref 70–99)
Potassium: 4 mmol/L (ref 3.5–5.1)
Sodium: 137 mmol/L (ref 135–145)

## 2019-12-13 LAB — SEDIMENTATION RATE: Sed Rate: 15 mm/hr (ref 0–16)

## 2019-12-13 MED ORDER — LINEZOLID 600 MG PO TABS: 600.0000 mg | ORAL_TABLET | Freq: Two times a day (BID) | ORAL | 0 refills | Status: DC

## 2019-12-13 MED ORDER — LEVOFLOXACIN 750 MG PO TABS: 750.0000 mg | ORAL_TABLET | Freq: Every day | ORAL | 0 refills | Status: DC

## 2019-12-13 MED FILL — levoFLOXacin 750 MG TABS: 750 | 6 days supply | Qty: 6 | Fill #0

## 2019-12-13 MED FILL — LINEZOLID 600 MG TABS: 600 | 6 days supply | Qty: 12 | Fill #0

## 2019-12-13 NOTE — Progress Notes (Signed)
Patient leaves AMA.

## 2019-12-13 NOTE — Progress Notes (Signed)
[facility] PROGRESS NOTE  Daniel Raymond WIO:[id number] DOB: 1985-01-23 DOA: 11/17/2019 PCP: Patient, No Pcp Per    Status: Remains inpatient appropriate because:Unsafe d/c plan and IV treatments appropriate due to intensity of illness or inability to take PO.  Requires IV antibiotics to treat osteomyelitis.  Unable to discharge home with IV line in place due to history of IV drug abuse with heroin.  Last dose IV antibiotics due 12/23/2019   Dispo: The patient is from: Home              Anticipated d/c is to: Home              Anticipated d/c date is: > 3 days (12/23/2019)              Patient currently is not medically stable to d/c.  Unsafe discharge plan in regards to requirement for IV antibiotics/PICC line in a patient with known IV drug abuse history   Code Status: Full Family Communication: Patient only DVT prophylaxis: Lovenox Vaccination status: We will need to discuss with patient whether he has received Covid vaccine  Foley catheter: No  HPI: Past medical history of IV drug abuse, tobacco abuse, hep C, asthma, chronic osteomyelitis presents with worsening neck pain. 06/2019 diagnosed with cervical osteomyelitis at [facility]. Left AMA. 9/18-26 admitted at [facility] for the same. Treated with IV vancomycin and Rocephin. Left AMA 9/26. 10/4 admitted at [facility]. Left AMA. 10/18-26 admitted at [facility] again. Left AMA. Found to be using heroin in the hospital. Currently plan is continue IV antibiotics.   Subjective: Laying in bed without any specific complaints.  Patient states he will call halfway houses today to determine which ones have availability for patients on Suboxone  Objective: Vitals:   12/13/19 0351 12/13/19 0817  BP: 112/75 105/77  Pulse: 93 96  Resp: 16 18  Temp: 97.9 F (36.6 C) 97.6 F (36.4 C)  SpO2: 100% 100%    Intake/Output Summary (Last 24 hours) at 12/13/2019 1301 Last  data filed at 12/13/2019 0900 Gross per 24 hour  Intake 1200 ml  Output --  Net 1200 ml   Filed Weights   11/17/19 1804 11/18/19 1507  Weight: 72.6 kg 72.6 kg    Exam:  Constitutional: NAD, awake, calm Respiratory: clear to auscultation bilaterally, room air normal respiratory effort.  Cardiovascular: Regular rate and rhythm, No R/M/T/G, Skin warm, dry and pink. Abdomen: non tender, Bowel sounds positive.  Tolerating solid diet Musculoskeletal: No joint deformity upper and lower extremities.Remains tender lateral /posterior neck but slowly improving Neurologic: CN 2-12 grossly intact. Sensation intact, DTR normal. Strength 5/5 x all 4 extremities.  Psychiatric: Normal judgment and insight. Alert and oriented x 3. Normal mood.    Assessment/Plan: Acute problems: Cervical spine osteomyelitis/Septic arthritis -Presented following multiple recent hospitalizations after leaving AMA with continued neck pain.  -MR C-spine with abnormal signal/contrast C2--3 facet joint consistent with septic arthritis with no evidence of active discitis/osteomyelitis but with chronic osteomyelitis C6/7.  -FU cultures have been negative -ED provider discussed case with neurosurgery PA, Kim who advised no intervention without epidural involvement and recommended medical management. -ID recommended 6 weeks of IV antibiotics (Rocephin and vancomycin) with projected end date 12/23/2019 -states 7 days of therapy given at [facility] The facility should be included in days of therapy -Cont PT exercises as recommended-patient reporting has helped with neck discomfort -Repeat MRI on 11/12 demonstrated improvement in infectious physiology.  Other changes  consistent with chronic interspinous ligament strain and injury also present and likely explains patient's ongoing pain syndrome  Polysubstance abuse -Patient with long standing history of heroin dependence, has history of leaving AMA and returning to different hospital  ED's.  -Recently started on Suboxone at [facility], but was discontinued as he was using heroin inpatient.  -Currently on Suboxone 8-2mg  PO BID and wishes to continue after discharge.   --restricted visitation 2/2 history of using heroin during previous inpatient visit at outside hospital  Acute on chronic neck pain control. -Continue Suboxone as above. -Continue Toradol prn (last doses available on 11/16) and lidocaine patch     Other problems: Hepatitis C -Viral load 173,697 on 10/10/2019 at [facility].  -Is treatment nave due to ongoing IV drug abuse.  -HIV, Gonococcal and Chlamydia work-up negative. -Per ID will follow up outpatient.  Hepatitis C genotype and HCV FibroSure ordered by ID  Tobacco dependence -Counseled on need for complete cessation. --Nicotine patch  Limited IV access. -Secondary to history of IV drug abuse -Currently has single-lumen midline catheter in place  Nutrition Status: Nutrition Problem: Increased nutrient needs Etiology: acute illness (osteomyelitis of cervical spine) Signs/Symptoms: estimated needs Interventions: Ensure Enlive (each supplement provides 350kcal and 20 grams of protein), MVI Estimated body mass index is 22.97 kg/m as calculated from the following:   Height as of this encounter: 5\' 10"  (1.778 m).   Weight as of this encounter: 72.6 kg.     Data Reviewed: Basic Metabolic Panel: Recent Labs  Lab 12/08/19 0435 12/13/19 0403  NA 140 137  K 4.0 4.0  CL 106 103  CO2 27 26  GLUCOSE 95 98  BUN 9 15  CREATININE 0.74 0.73  CALCIUM 9.5 9.6   Liver Function Tests: No results for input(s): AST, ALT, ALKPHOS, BILITOT, PROT, ALBUMIN in the last 168 hours. No results for input(s): LIPASE, AMYLASE in the last 168 hours. No results for input(s): AMMONIA in the last 168 hours. CBC: Recent Labs  Lab 12/13/19 0403  WBC 4.0  NEUTROABS 1.5*  HGB 10.9*  HCT 32.6*  MCV 85.6  PLT 190   Cardiac Enzymes: No  results for input(s): CKTOTAL, CKMB, CKMBINDEX, TROPONINI in the last 168 hours. BNP (last 3 results) No results for input(s): BNP in the last 8760 hours.  ProBNP (last 3 results) No results for input(s): PROBNP in the last 8760 hours.  CBG: No results for input(s): GLUCAP in the last 168 hours.  No results found for this or any previous visit (from the past 240 hour(s)).   Studies: No results found.  Scheduled Meds: . buprenorphine-naloxone  1 tablet Sublingual BID  . Chlorhexidine Gluconate Cloth  6 each Topical Daily  . enoxaparin (LOVENOX) injection  40 mg Subcutaneous Q24H  . influenza vac split quadrivalent PF  0.5 mL Intramuscular Tomorrow-1000  . lidocaine  1 patch Transdermal Q24H  . multivitamin with minerals  1 tablet Oral Daily  . pantoprazole  40 mg Oral Daily  . sodium chloride flush  10-40 mL Intracatheter Q12H   Continuous Infusions: . sodium chloride 10 mL/hr at 12/08/19 0400  . cefTRIAXone (ROCEPHIN)  IV 2 g (12/13/19 0505)  . vancomycin 1,750 mg (12/13/19 0553)    Principal Problem:   Subacute osteomyelitis of cervical spine (HCC) Active Problems:   IV drug abuse (HCC)   Polysubstance dependence including opioid type drug with complication, episodic abuse, with unspecified complication (HCC)   Tobacco dependence   Hepatitis C   Methamphetamine  dependence, continuous (HCC)   Consultants:  Psychiatry  Infectious disease  Procedures:  Placement of midline IV catheter  Antibiotics: Anti-infectives (From admission, onward)   Start     Dose/Rate Route Frequency Ordered Stop   12/10/19 1800  vancomycin (VANCOREADY) IVPB 1750 mg/350 mL        1,750 mg 175 mL/hr over 120 Minutes Intravenous Every 12 hours 12/10/19 1104 12/19/19 2359   11/23/19 0600  cefTRIAXone (ROCEPHIN) 2 g in sodium chloride 0.9 % 100 mL IVPB        2 g 200 mL/hr over 30 Minutes Intravenous Every 24 hours 11/22/19 1135     11/20/19 0200  vancomycin (VANCOREADY) IVPB 1500 mg/300  mL  Status:  Discontinued        1,500 mg 150 mL/hr over 120 Minutes Intravenous Every 12 hours 11/19/19 1511 12/10/19 1104   11/18/19 1615  Oritavancin Diphosphate (ORBACTIV) 1,200 mg in dextrose 5 % IVPB        1,200 mg 333.3 mL/hr over 180 Minutes Intravenous Once 11/18/19 1518 11/19/19 1907   11/18/19 1000  cefTRIAXone (ROCEPHIN) 2 g in sodium chloride 0.9 % 100 mL IVPB  Status:  Discontinued        2 g 200 mL/hr over 30 Minutes Intravenous Every 12 hours 11/17/19 2315 11/22/19 1135   11/17/19 2330  vancomycin (VANCOREADY) IVPB 1500 mg/300 mL  Status:  Discontinued        1,500 mg 150 mL/hr over 120 Minutes Intravenous Every 12 hours 11/17/19 2315 11/19/19 1511   11/17/19 2315  cefTRIAXone (ROCEPHIN) 1 g in sodium chloride 0.9 % 100 mL IVPB  Status:  Discontinued        1 g 200 mL/hr over 30 Minutes Intravenous Every 12 hours 11/17/19 2300 11/17/19 2315       Time spent: 15 minutes    Junious Silk ANP  [facility] Pager [phone]. If 7PM-7AM, please contact night-coverage at [email or web address] 12/13/2019, 1:01 PM  LOS: 25 days

## 2019-12-13 NOTE — Plan of Care (Signed)
  Problem: Activity: Goal: Risk for activity intolerance will decrease Outcome: Progressing   Problem: Nutrition: Goal: Adequate nutrition will be maintained Outcome: Progressing   Problem: Health Behavior/Discharge Planning: Goal: Ability to manage health-related needs will improve Outcome: Progressing   Problem: Pain Managment: Goal: General experience of comfort will improve Outcome: Progressing   Problem: Skin Integrity: Goal: Risk for impaired skin integrity will decrease Outcome: Progressing

## 2019-12-13 NOTE — Progress Notes (Addendum)
Pharmacy Antibiotic Note  Daniel Raymond is a 34 y.o. male admitted on 11/17/2019 with discitis, left outside hospital AMA.  Pharmacy has been consulted for vancomycin dosing.    Scr stable, UOP not charted. Afebrile, WBC WNL.  Plan: Continue vanc to 1750mg  IV Q12H Continue CTX 2gm IV Q24H Monitor renal fxn, clinical progress, next vanc trough 11/24  Height: 5\' 10"  (177.8 cm) Weight: 72.6 kg (160 lb 0.9 oz) IBW/kg (Calculated) : 73  Temp (24hrs), Avg:98.1 F (36.7 C), Min:97.6 F (36.4 C), Max:98.5 F (36.9 C)  Recent Labs  Lab 12/08/19 0435 12/10/19 0517 12/13/19 0403  WBC  --   --  4.0  CREATININE 0.74  --  0.73  VANCOTROUGH  --  12*  --     Estimated Creatinine Clearance: 133.6 mL/min (by C-G formula based on SCr of 0.73 mg/dL).    Allergies  Allergen Reactions  . Ibuprofen Other (See Comments)    Stomach pains   Antimicrobials  Vanc 10/17 >> (11/28 per ID note 11/10, including the 7d at [facility]) CTX 10/17 >>  Oritavancin x1 10/28  Dose adjustments  10/30 VT 15 (drawn late) >> cont 1500mg  q12 11/3 VT 17 >> no change  11/9 VT 13 >> no change 11/12 VT 13 (>2 hrs delayed) >> no change, repeat labs in AM 11/13 VT 16 >> no change 11/19 VT 12 >> incr to 1750mg  q12  Microbiology  10/29 BCx - negative  12/13, PharmD, BCPS, [facility] Clinical Pharmacist  Please check AMION for all [facility] Pharmacy phone numbers After 10:00 PM, call Main Pharmacy [phone]

## 2019-12-13 NOTE — Progress Notes (Signed)
Patient wants to leave AMA. Notified MD. MD in talked to patient. Patient signed AMA. Pharmacy delivered home meds.

## 2019-12-15 LAB — C-REACTIVE PROTEIN

## 2019-12-15 NOTE — Discharge Summary (Signed)
[facility] Discharge Summary   Patient: Daniel Raymond XKG:[id number]   PCP: Patient, No Pcp Per DOB: 10-Aug-1985   Date of admission: 11/17/2019   Date of discharge: 12/15/2019    Discharge Diagnoses: patient left AMA Principal Problem:   Subacute osteomyelitis of cervical spine ([place]) Active Problems:   IV drug abuse ([place])   Polysubstance dependence including opioid type drug with complication, episodic abuse, with unspecified complication (HCC)   Tobacco dependence   Hepatitis C   Methamphetamine dependence, continuous ([place])  Discharge Condition: good  History of present illness: As per the H and P dictated on admission, " Daniel Raymond is a 35 y.o. male with medical history significant of IVDA; tobacco dependence; Hep C; asthma; and osteomyelitis of the cervical spine presenting with neck pain.  This appears to have been initially diagnosed in June at [facility], but he left AMA.  He was then admitted for [facility] from 9/18-26 for cervical osteo, treated with Vanc, Rocephin; TTE on 9/20 without obvious vegetations.  He left AMA on 9/26.  He presented to [facility] on 10/4 and again left AMA.   He was last admitted at [facility] from 10/18-26 with C-spine osteomyelitis, again treated with Vanc and Rocephin.  Hep C viral load was 173,000.  He was seen by psychiatry and started on Subutex, but during the hospitalization he was found to be using heroin and so this was discontinued.  He left AMA on 10/2.  Since he left the hospital, he hasn't finished his antibiotics and felt like he need to come back to finish them.  His neck is continuing to hurt.  He does want to stop using and is willing to do buprenorphine again.  He smoked marijuana yesterday.  He acknowledges using heroin in the hospital last week."  Hospital Course:  Summary of his active problems in the hospital is as following. Cervical spine osteomyelitis Septic arthritis Patient presenting following multiple recent  hospitalizations after leaving AMA with continued neck pain. MR C-spine with abnormal signal/contrast C2--3 facet joint consistent with septic arthritis with no evidence of active discitis/osteomyelitis but with chronic osteomyelitis C6/7.  Patient is afebrile without leukocytosis.  ESR 27, CRP 0.9.  ED provider discussed case with neurosurgery PA, Kim who advised no intervention without epidural involvement and recommended medical management. QuantiFERON gold:Negative Blood cultures x2: no growth x 5 days Infectious disease following, appreciate assistance, recommending 6 weeks of IV antibiotics. Projected end date 12/23/2019. Repeat MRI 12/03/2019 shows improvement in infectious physiology.  Hepatitis C genotype 1a. Viral load 173,697 on 10/10/2019 at [facility].  Is treatment nave due to ongoing IV drug abuse.  HIV, Gonococcal and Chlamydia work-up negative. Per ID will follow up outpatient.  Polysubstance abuse Patient with long standing history of heroin dependence, has history of leaving AMA and returning to different hospital ED's.  Recently started on Suboxone at [facility], but was discontinued as he was using heroin inpatient.  He currently desires to quit. --Suboxone 8-82m PO BID, patient now wants to continue Suboxone on discharge. Received information regarding rehab options as well as Suboxone therapy option. Currently also has visit with restriction.  Tobacco dependence Counseled on need for complete cessation. --Nicotine patch  Limited IV access. Has a PICC line now. Cannot leave hospital with a PICC line.  patient left AMA, due to some family emergency. He was given Antibiotics zyvox and levofloxacin for 11/28. To finish the course, the Antibiotics were filled at hospital transition of care pharmacy and given  to the pt  Procedures and Results:  picc insertion and removal   Consultations:  ID  The results of significant diagnostics  from this hospitalization (including imaging, microbiology, ancillary and laboratory) are listed below for reference.    Significant Diagnostic Studies: MR CERVICAL SPINE W WO CONTRAST  Result Date: 12/03/2019 CLINICAL DATA:  Cervical radiculopathy, infection suspected. Additional history provided: Past medical history of IV drug abuse, tobacco abuse, hepatitis-C, asthma, chronic osteomyelitis presenting with worsening neck pain. Diagnosed with cervical osteomyelitis at [facility] June 2021 (left AMA). Treated with IV antibiotics September 18 three hundred [place]th. EXAM: MRI CERVICAL SPINE WITHOUT AND WITH CONTRAST TECHNIQUE: Multiplanar and multiecho pulse sequences of the cervical spine, to include the craniocervical junction and cervicothoracic junction, were obtained without and with intravenous contrast. CONTRAST:  8m GADAVIST GADOBUTROL 1 MMOL/ML IV SOLN COMPARISON:  Cervical spine MRI 11/18/2019. CT cervical spine 05/07/2019. FINDINGS: Mild-to-moderate intermittent motion degradation. Alignment: Prominent reversal of the expected cervical lordosis centered at C5-C6. Unchanged unchanged 3 mm C2-C3 grade 1 anterolisthesis. Unchanged trace C3-C4 grade 1 anterolisthesis. Unchanged C6-C7 grade 1 retrolisthesis. Vertebrae: As compared to the prior MRI of 11/10/2019, there is persistent although decreased edema signal and enhancement within the C2 spinous process, within the left C2 pedicle and articular pillar, within the C3 spinous process and within the right C3 articular pillar. Persistent abnormal enhancement within and surrounding the left C2-C3 facet joint. Persistent although decreased edema signal and enhancement along the endplates at CV7-C5 Unchanged mild chronic C5 vertebral body height loss anteriorly. Vertebral body height is otherwise maintained. Multilevel degenerative endplate irregularity greatest at C5-C6. Redemonstrated bridging ventral osteophyte at C5-C6. Cord: No  spinal cord signal abnormality. Spinal cord flattening at the C5-C6 level as described below. Posterior Fossa, vertebral arteries, paraspinal tissues: No abnormality identified within included portions of the posterior fossa. Flow voids preserved within the imaged cervical vertebral arteries. Unchanged T2/STIR hyperintense signal abnormality within the interspinous space at the C5-C6 level which is nonspecific (series 3, image 7). Disc levels: Unless otherwise stated, the level by level findings below have not significantly changed since prior MRI 11/18/2019. C2-C3: Grade 1 anterolisthesis. Disc uncovering. Left greater than right uncovertebral hypertrophy. Facet hypertrophy (greater on the left). Ligamentum flavum hypertrophy. No significant spinal canal stenosis. Bilateral neural foraminal narrowing (mild right, moderate left). C3-C4: Disc bulge. Uncovertebral and facet hypertrophy. No significant spinal canal stenosis. Bilateral neural foraminal narrowing (mild right, moderate left). C4-C5: Facet hypertrophy. No significant disc herniation or stenosis. C5-C6: Cervical kyphosis centered at this level. Posterior disc osteophyte complex. Uncovertebral hypertrophy. The posterior disc osteophyte complex contributes to moderate spinal canal stenosis, contacting and flattening the ventral spinal cord. No spinal cord signal abnormality is identified. Moderate right neural foraminal narrowing. C6-C7: Posterior disc osteophyte complex. Uncovertebral and facet hypertrophy. Mild spinal canal stenosis. Bilateral neural foraminal narrowing (severe right, moderate left). C7-T1: Disc bulge. Facet and ligamentum flavum hypertrophy. Mild relative spinal canal narrowing. Moderate right neural foraminal narrowing. These results will be called to the ordering clinician or representative by the Radiologist Assistant, and communication documented in the PACS or [employer] IMPRESSION: Comparison is made to the prior cervical  spine MRI of 11/18/2019. Persistent although decreased edema signal and enhancement within the C2 and C3 posterior elements. Persistent abnormal enhancement within and surrounding the left C2-C3 facet joint. Findings are compatible with sequela of osteomyelitis and left C2-C3 facet joint septic arthritis. Persistent although decreased edema signal and enhancement along the endplates at CY8-F0 Findings may  be degenerative in etiology or may reflect an additional site of osteomyelitis. Cervical spondylosis as outlined and unchanged from the MRI of 11/18/2019. Most notably at C5-C6, a cervical kyphosis and posterior disc osteophyte complex contribute to moderate spinal canal stenosis. The disc osteophyte complex contacts and flattens the ventral spinal cord. Unchanged signal abnormality within the C5-C6 interspinous space, nonspecific but possibly reflecting chronic interspinous ligament strain/injury. Electronically Signed   By: Kellie Simmering DO   On: 12/03/2019 15:05   MR Cervical Spine W or Wo Contrast  Result Date: 11/18/2019 CLINICAL DATA:  History of discitis-osteomyelitis.  Neck pain EXAM: MRI CERVICAL SPINE WITHOUT AND WITH CONTRAST TECHNIQUE: Multiplanar and multiecho pulse sequences of the cervical spine, to include the craniocervical junction and cervicothoracic junction, were obtained without and with intravenous contrast. CONTRAST:  7.56m GADAVIST GADOBUTROL 1 MMOL/ML IV SOLN COMPARISON:  CT cervical spine 05/07/2019 FINDINGS: Alignment: Reversal of normal cervical lordosis Vertebrae: There is hyperintense T2 and hypointense T1-weighted signal at C6 and C7. There is also mild contrast enhancement at both locations. There is no abnormal signal within the discs. There is abnormal signal and contrast enhancement at the left C2-3 facet joint. Cord: Normal Posterior Fossa, vertebral arteries, paraspinal tissues: Negative. Disc levels: C1-2: Unremarkable. C2-3: Left uncovertebral hypertrophy. There is no  spinal canal stenosis. Mild left neural foraminal stenosis. C3-4: Bilateral uncovertebral hypertrophy. There is no spinal canal stenosis. Mild bilateral neural foraminal stenosis. C4-5: Normal disc space and facet joints. There is no spinal canal stenosis. No neural foraminal stenosis. C5-6: Disc space narrowing with endplate ridging. Mild spinal canal stenosis. Mild right neural foraminal stenosis. C6-7: Small disc bulge with left-greater-than-right uncovertebral hypertrophy. There is no spinal canal stenosis. Mild bilateral neural foraminal stenosis. C7-T1: Normal disc space and facet joints. There is no spinal canal stenosis. No neural foraminal stenosis. IMPRESSION: 1. There is abnormal signal and contrast enhancement at the left C2-3 facet joint, which is concerning for septic arthritis. 2. No evidence for active discitis-osteomyelitis. Signal changes at C6-7 could be degenerative or related to chronic osteomyelitis. 3. Mild spinal canal stenosis at C5-6. 4. Mild multilevel neural foraminal stenosis, including on the left at C2-3 and bilaterally at C3-4 and C6-7. Electronically Signed   By: KUlyses JarredM.D.   On: 11/18/2019 03:38   [place] SITE RITE  Result Date: 12/07/2019 If Site Rite image not attached, placement could not be confirmed due to current cardiac rhythm.  [place] SITE RITE  Result Date: 11/28/2019 If Site Rite image not attached, placement could not be confirmed due to current cardiac rhythm.  [place] SITE RITE  Result Date: 11/18/2019 If Site Rite image not attached, placement could not be confirmed due to current cardiac rhythm.   Microbiology: No results found for this or any previous visit (from the past 240 hour(s)).   Labs: CBC: Recent Labs  Lab 12/13/19 0403  WBC 4.0  NEUTROABS 1.5*  HGB 10.9*  HCT 32.6*  MCV 85.6  PLT 1607  Basic Metabolic Panel: Recent Labs  Lab 12/08/19 0435 12/13/19 0403  NA 140 137  K 4.0 4.0  CL 106 103  CO2 27 26  GLUCOSE 95  98  BUN 9 15  CREATININE 0.74 0.73  CALCIUM 9.5 9.6   Liver Function Tests: No results for input(s): AST, ALT, ALKPHOS, BILITOT, PROT, ALBUMIN in the last 168 hours. No results for input(s): LIPASE, AMYLASE in the last 168 hours. No results for input(s): AMMONIA in the last 168  hours. Cardiac Enzymes: No results for input(s): CKTOTAL, CKMB, CKMBINDEX, TROPONINI in the last 168 hours. BNP (last 3 results) No results for input(s): BNP in the last 8760 hours. CBG: No results for input(s): GLUCAP in the last 168 hours. Time spent: 30 minutes  Signed:  Berle Mull  [facility]

## 2019-12-30 ENCOUNTER — Inpatient Hospital Stay: Payer: Medicaid Other | Admitting: Infectious Diseases

## 2020-01-07 ENCOUNTER — Inpatient Hospital Stay: Payer: Medicaid Other | Admitting: Internal Medicine

## 2020-03-04 ENCOUNTER — Other Ambulatory Visit: Payer: Self-pay

## 2020-03-04 ENCOUNTER — Inpatient Hospital Stay (HOSPITAL_COMMUNITY): Payer: Self-pay

## 2020-03-04 ENCOUNTER — Encounter (HOSPITAL_COMMUNITY): Payer: Self-pay | Admitting: Emergency Medicine

## 2020-03-04 ENCOUNTER — Emergency Department (HOSPITAL_COMMUNITY): Payer: Self-pay

## 2020-03-04 ENCOUNTER — Inpatient Hospital Stay (HOSPITAL_COMMUNITY)
Admission: EM | Admit: 2020-03-04 | Discharge: 2020-03-07 | DRG: 872 | Discharge status: Against Medical Advice | Payer: Self-pay | Attending: Internal Medicine | Admitting: Internal Medicine

## 2020-03-04 DIAGNOSIS — A419 Sepsis, unspecified organism: Principal | ICD-10-CM | POA: Diagnosis present

## 2020-03-04 DIAGNOSIS — F1510 Other stimulant abuse, uncomplicated: Secondary | ICD-10-CM | POA: Diagnosis present

## 2020-03-04 DIAGNOSIS — F17210 Nicotine dependence, cigarettes, uncomplicated: Secondary | ICD-10-CM | POA: Diagnosis present

## 2020-03-04 DIAGNOSIS — Z5900 Homelessness unspecified: Secondary | ICD-10-CM

## 2020-03-04 DIAGNOSIS — Z20822 Contact with and (suspected) exposure to covid-19: Secondary | ICD-10-CM | POA: Diagnosis present

## 2020-03-04 DIAGNOSIS — M4622 Osteomyelitis of vertebra, cervical region: Secondary | ICD-10-CM | POA: Diagnosis present

## 2020-03-04 DIAGNOSIS — F1910 Other psychoactive substance abuse, uncomplicated: Secondary | ICD-10-CM | POA: Diagnosis present

## 2020-03-04 DIAGNOSIS — F1113 Opioid abuse with withdrawal: Secondary | ICD-10-CM | POA: Diagnosis present

## 2020-03-04 DIAGNOSIS — Z5329 Procedure and treatment not carried out because of patient's decision for other reasons: Secondary | ICD-10-CM | POA: Diagnosis not present

## 2020-03-04 DIAGNOSIS — M009 Pyogenic arthritis, unspecified: Secondary | ICD-10-CM | POA: Diagnosis present

## 2020-03-04 DIAGNOSIS — B1920 Unspecified viral hepatitis C without hepatic coma: Secondary | ICD-10-CM | POA: Diagnosis present

## 2020-03-04 DIAGNOSIS — I959 Hypotension, unspecified: Secondary | ICD-10-CM | POA: Diagnosis present

## 2020-03-04 DIAGNOSIS — F17200 Nicotine dependence, unspecified, uncomplicated: Secondary | ICD-10-CM | POA: Diagnosis present

## 2020-03-04 DIAGNOSIS — F1210 Cannabis abuse, uncomplicated: Secondary | ICD-10-CM | POA: Diagnosis present

## 2020-03-04 LAB — COMPREHENSIVE METABOLIC PANEL
ALT: 38 U/L (ref 0–44)
AST: 45 U/L — ABNORMAL HIGH (ref 15–41)
Albumin: 3.9 g/dL (ref 3.5–5.0)
Alkaline Phosphatase: 98 U/L (ref 38–126)
Anion gap: 9 (ref 5–15)
BUN: 16 mg/dL (ref 6–20)
CO2: 26 mmol/L (ref 22–32)
Calcium: 9.4 mg/dL (ref 8.9–10.3)
Chloride: 99 mmol/L (ref 98–111)
Creatinine, Ser: 0.91 mg/dL (ref 0.61–1.24)
GFR, Estimated: >60 mL/min (ref >60)
Glucose, Bld: 132 mg/dL — ABNORMAL HIGH (ref 70–99)
Potassium: 3.9 mmol/L (ref 3.5–5.1)
Sodium: 134 mmol/L — ABNORMAL LOW (ref 135–145)
Total Bilirubin: 0.6 mg/dL (ref 0.3–1.2)
Total Protein: 7 g/dL (ref 6.5–8.1)

## 2020-03-04 LAB — CBC WITH DIFFERENTIAL/PLATELET
Abs Immature Granulocytes: 0.04 10*3/uL (ref 0.00–0.07)
Basophils Absolute: 0 10*3/uL (ref 0.0–0.1)
Basophils Relative: 0 %
Eosinophils Absolute: 0 10*3/uL (ref 0.0–0.5)
Eosinophils Relative: 0 %
HCT: 29.7 % — ABNORMAL LOW (ref 39.0–52.0)
Hemoglobin: 9.2 g/dL — ABNORMAL LOW (ref 13.0–17.0)
Immature Granulocytes: 0 %
Lymphocytes Relative: 9 %
Lymphs Abs: 0.9 10*3/uL (ref 0.7–4.0)
MCH: 26.7 pg (ref 26.0–34.0)
MCHC: 31 g/dL (ref 30.0–36.0)
MCV: 86.1 fL (ref 80.0–100.0)
Monocytes Absolute: 0.7 10*3/uL (ref 0.1–1.0)
Monocytes Relative: 7 %
Neutro Abs: 8.1 10*3/uL — ABNORMAL HIGH (ref 1.7–7.7)
Neutrophils Relative %: 84 %
Platelets: 272 10*3/uL (ref 150–400)
RBC: 3.45 MIL/uL — ABNORMAL LOW (ref 4.22–5.81)
RDW: 16.9 % — ABNORMAL HIGH (ref 11.5–15.5)
WBC: 9.7 10*3/uL (ref 4.0–10.5)
nRBC: 0 % (ref 0.0–0.2)

## 2020-03-04 LAB — LACTIC ACID, PLASMA
Lactic Acid, Venous: 1.2 mmol/L (ref 0.5–1.9)
Lactic Acid, Venous: 1 mmol/L (ref 0.5–1.9)

## 2020-03-04 LAB — PROTIME-INR
INR: 1 (ref 0.8–1.2)
Prothrombin Time: 12.8 seconds (ref 11.4–15.2)

## 2020-03-04 LAB — ETHANOL: Alcohol, Ethyl (B): <10 mg/dL (ref <10)

## 2020-03-04 LAB — APTT: aPTT: 33 seconds (ref 24–36)

## 2020-03-04 LAB — TSH: TSH: 0.713 u[IU]/mL (ref 0.350–4.500)

## 2020-03-04 IMAGING — MR MR THORACIC SPINE WO/W CM
5 of 11 series · 18 of 48 positions shown · IV contrast (gadavist)
Comparison: CT [DATE]

CLINICAL DATA: Back pain.  Question infection.

EXAM:
MRI THORACIC WITHOUT AND WITH CONTRAST
TECHNIQUE: Multiplanar and multiecho pulse sequences of the thoracic spine were
obtained without and with intravenous contrast.
CONTRAST:  7.5mL GADAVIST GADOBUTROL 1 MMOL/ML IV SOLN

[Series 16: T1 · sagittal · 3.0mm · 0.62mm/px · 3 of 13 slices shown (1 of 3)]
[im 1/13]
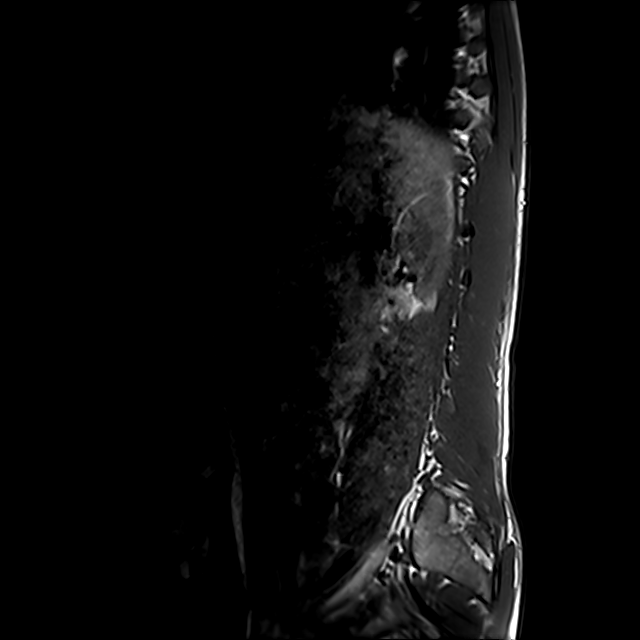
[im 7/13]
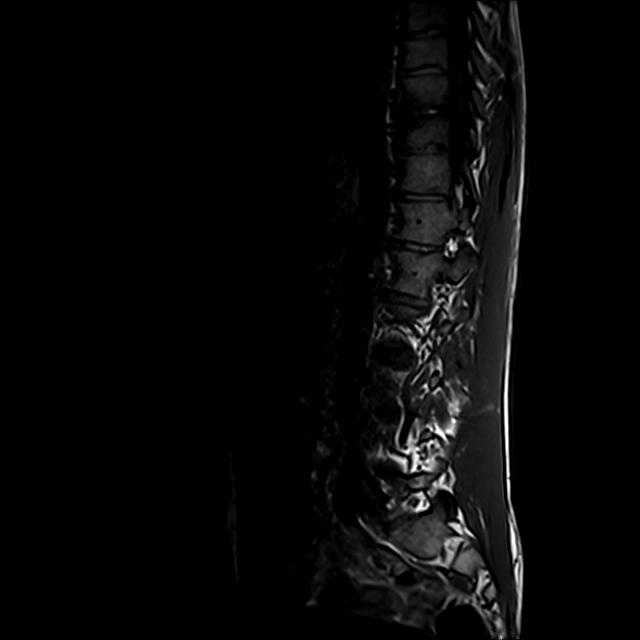
[im 13/13]
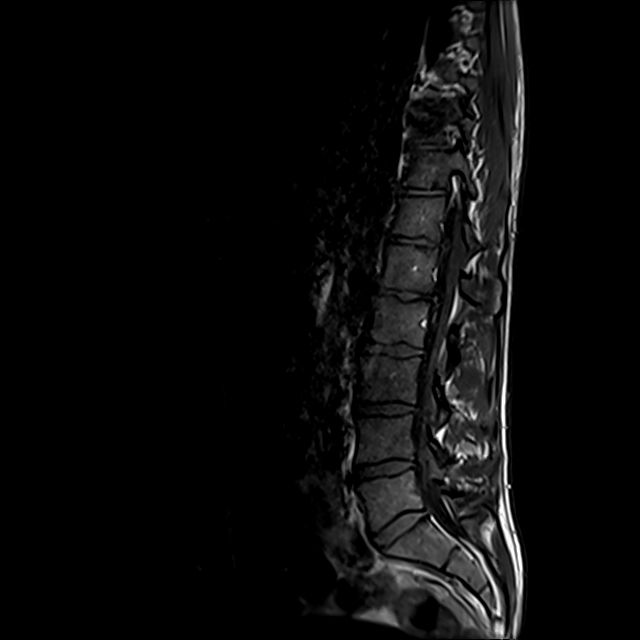

[Series 17: T1 · sagittal · 3.0mm · 0.62mm/px · 3 of 13 slices shown (2 of 3)]
[im 1/13]
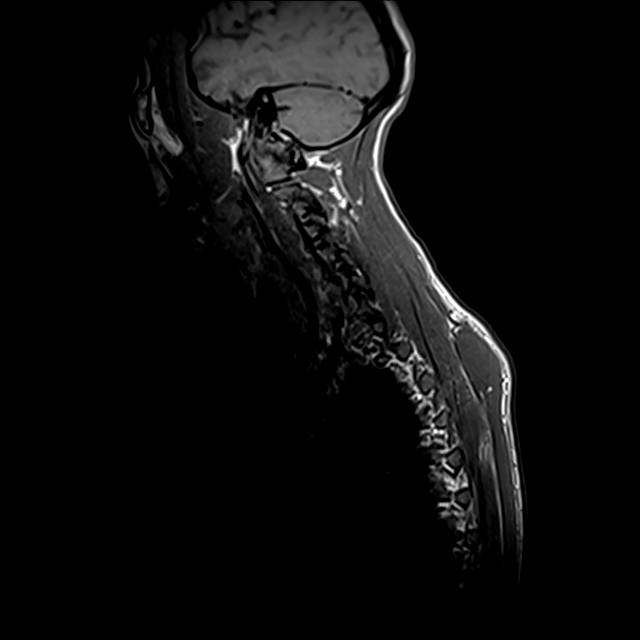
[im 7/13]
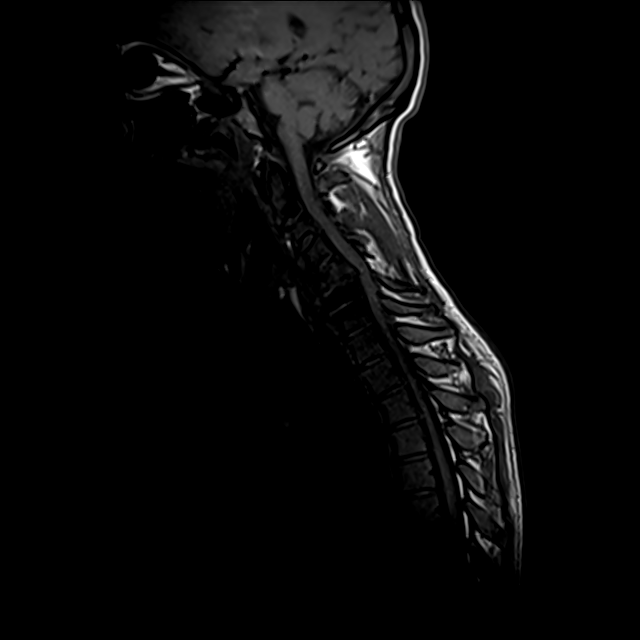
[im 13/13]
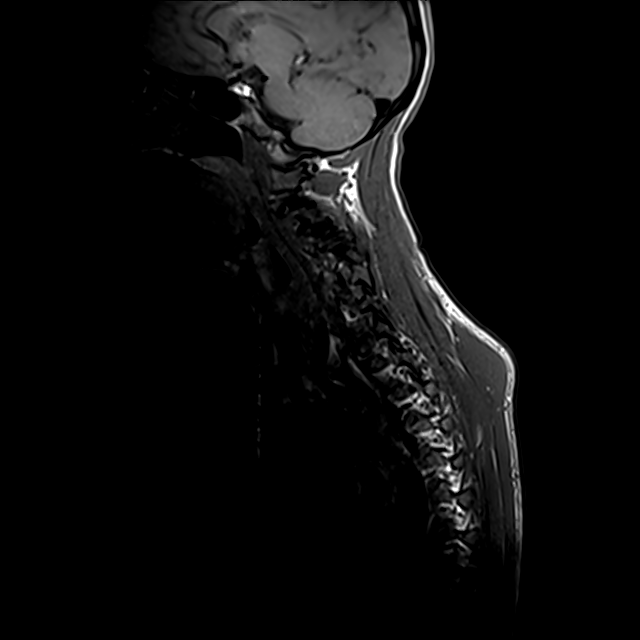

[Series 18: T1 · sagittal · 3.3mm · 0.62mm/px · 2 of 13 slices shown (3 of 3)]
[im 1/13]
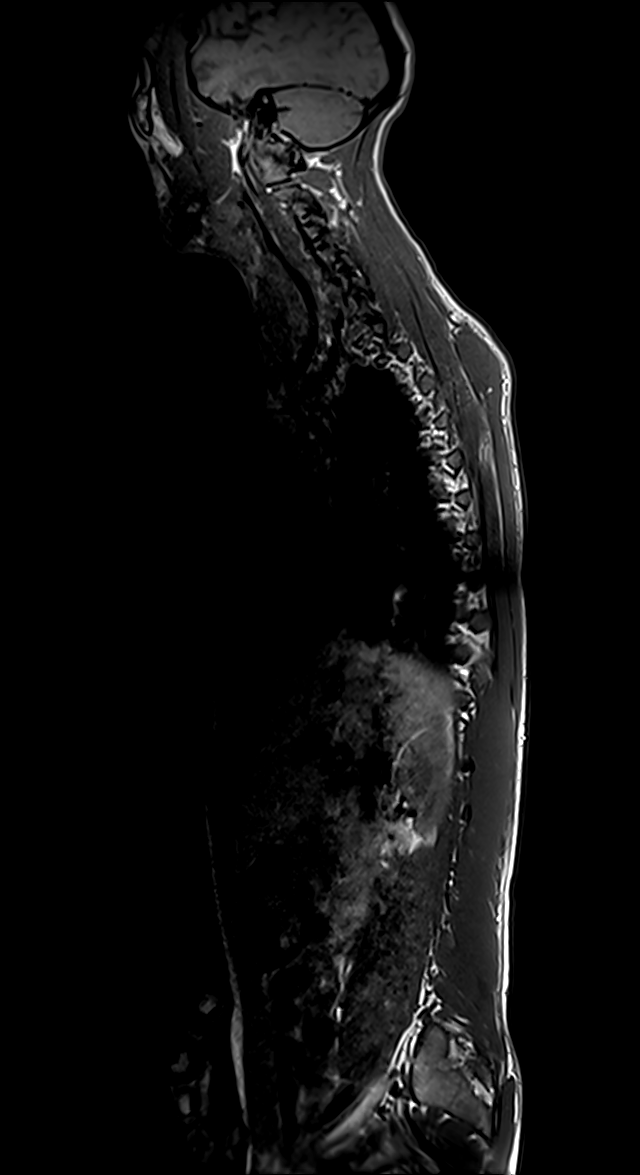
[im 13/13]
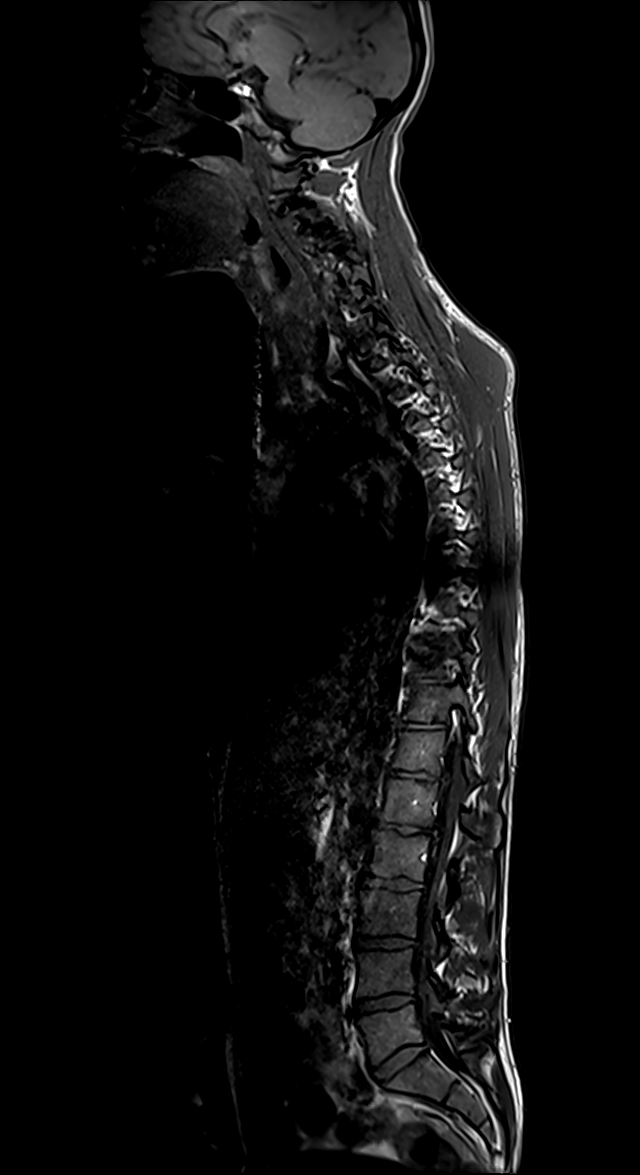

[Series 20: T2 · sagittal · 3.0mm · 0.76mm/px · 3 of 17 slices shown (1 of 2)]
[im 1/17]
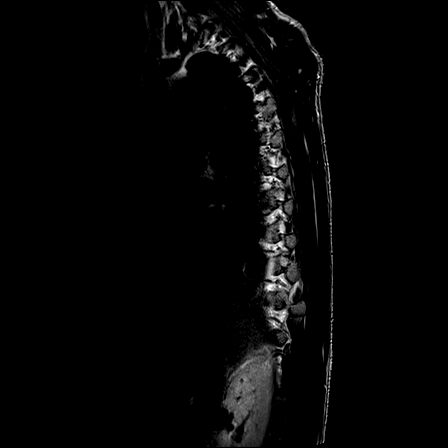
[im 9/17]
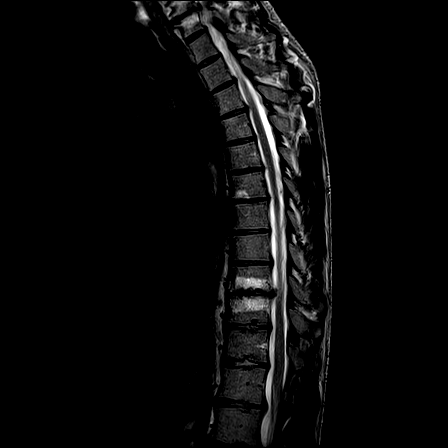
[im 17/17]
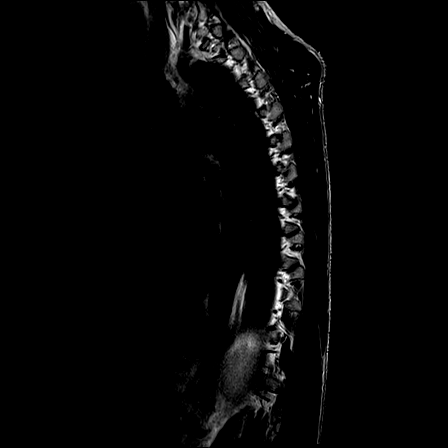

[Series 23: T2 · axial · 5.0mm · 0.59mm/px · z∈[-366,-107]mm · 7 of 39 slices shown (2 of 2)]
[im 1/39]
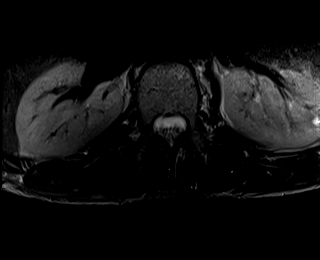
[im 7/39]
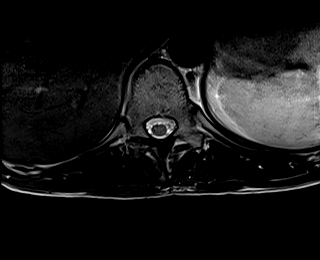
[im 13/39]
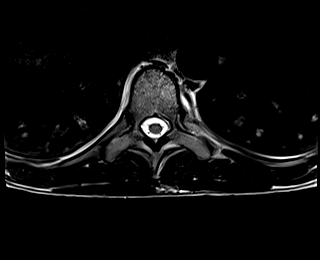
[im 20/39]
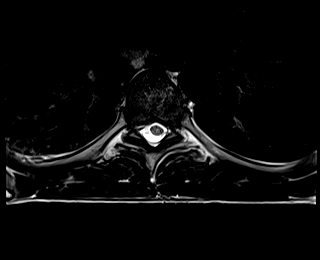
[im 26/39]
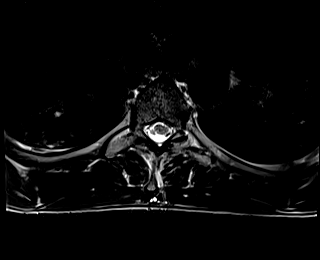
[im 32/39]
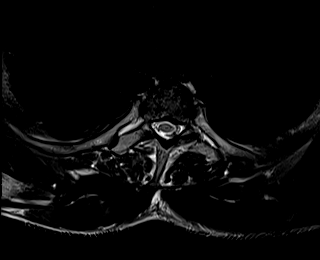
[im 39/39]
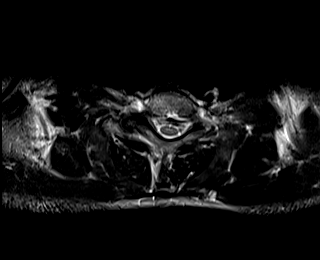

[18 of 48 positions shown; findings below may reference images not displayed]

FINDINGS: Alignment:  Mild curvature convex to the right.

Vertebrae: Endplate edema and enhancement at the T9-10 level, worse
on the left. Right side is preserved. Findings are favored to be
degenerative. Degenerative changes were seen on the CT of
[DATE]. Lack 0 pronounced progression and lack of disc space T2
signal or enhancement argue against infection. No paravertebral
collection.

Cord:  No cord compression or primary cord lesion.

Paraspinal and other soft tissues: Negative. No paravertebral
inflammatory findings.

Disc levels:

T1-2: Normal

T2-3 through T8-9: Disc bulges. No compressive stenosis of the canal
or foramina.

T9-10: Endplate osteophytes and shallow protrusion of the disc more
prominent on the left. Narrowing of the ventral subarachnoid space
but no compression of the cord. Moderate foraminal narrowing on the
left could possibly affect the left T9 nerve. See above discussion,
favoring degenerative change over infection.

T10-11, T11-12 and T12-L1: Noncompressive disc bulges.
IMPRESSION: Degenerative endplate edema and enhancement at the T9-10 level,
worse on the left. Endplate osteophytes and shallow protrusion of
the disc more prominent on the left. Moderate foraminal narrowing on
the left could possibly affect the left T9 nerve. Degenerative
change at this level is similar but slightly more pronounced than
was seen on the CT of [DATE]. I do not think the findings at
this level are likely due to spinal infection. Of course, very early
infection could not be excluded, and follow-up may be warranted.

## 2020-03-04 IMAGING — DX DG CHEST 1V PORT
1 series · 1 of 1 positions shown · non-contrast
Comparison: Radiograph [DATE]

CLINICAL DATA: Questionable sepsis - evaluate for abnormality

Patient reports hospitalization months ago for spinal
infection/osteomyelitis and left AMA. Severe upper back pain.
EXAM:
PORTABLE CHEST 1 VIEW

[chest ap]
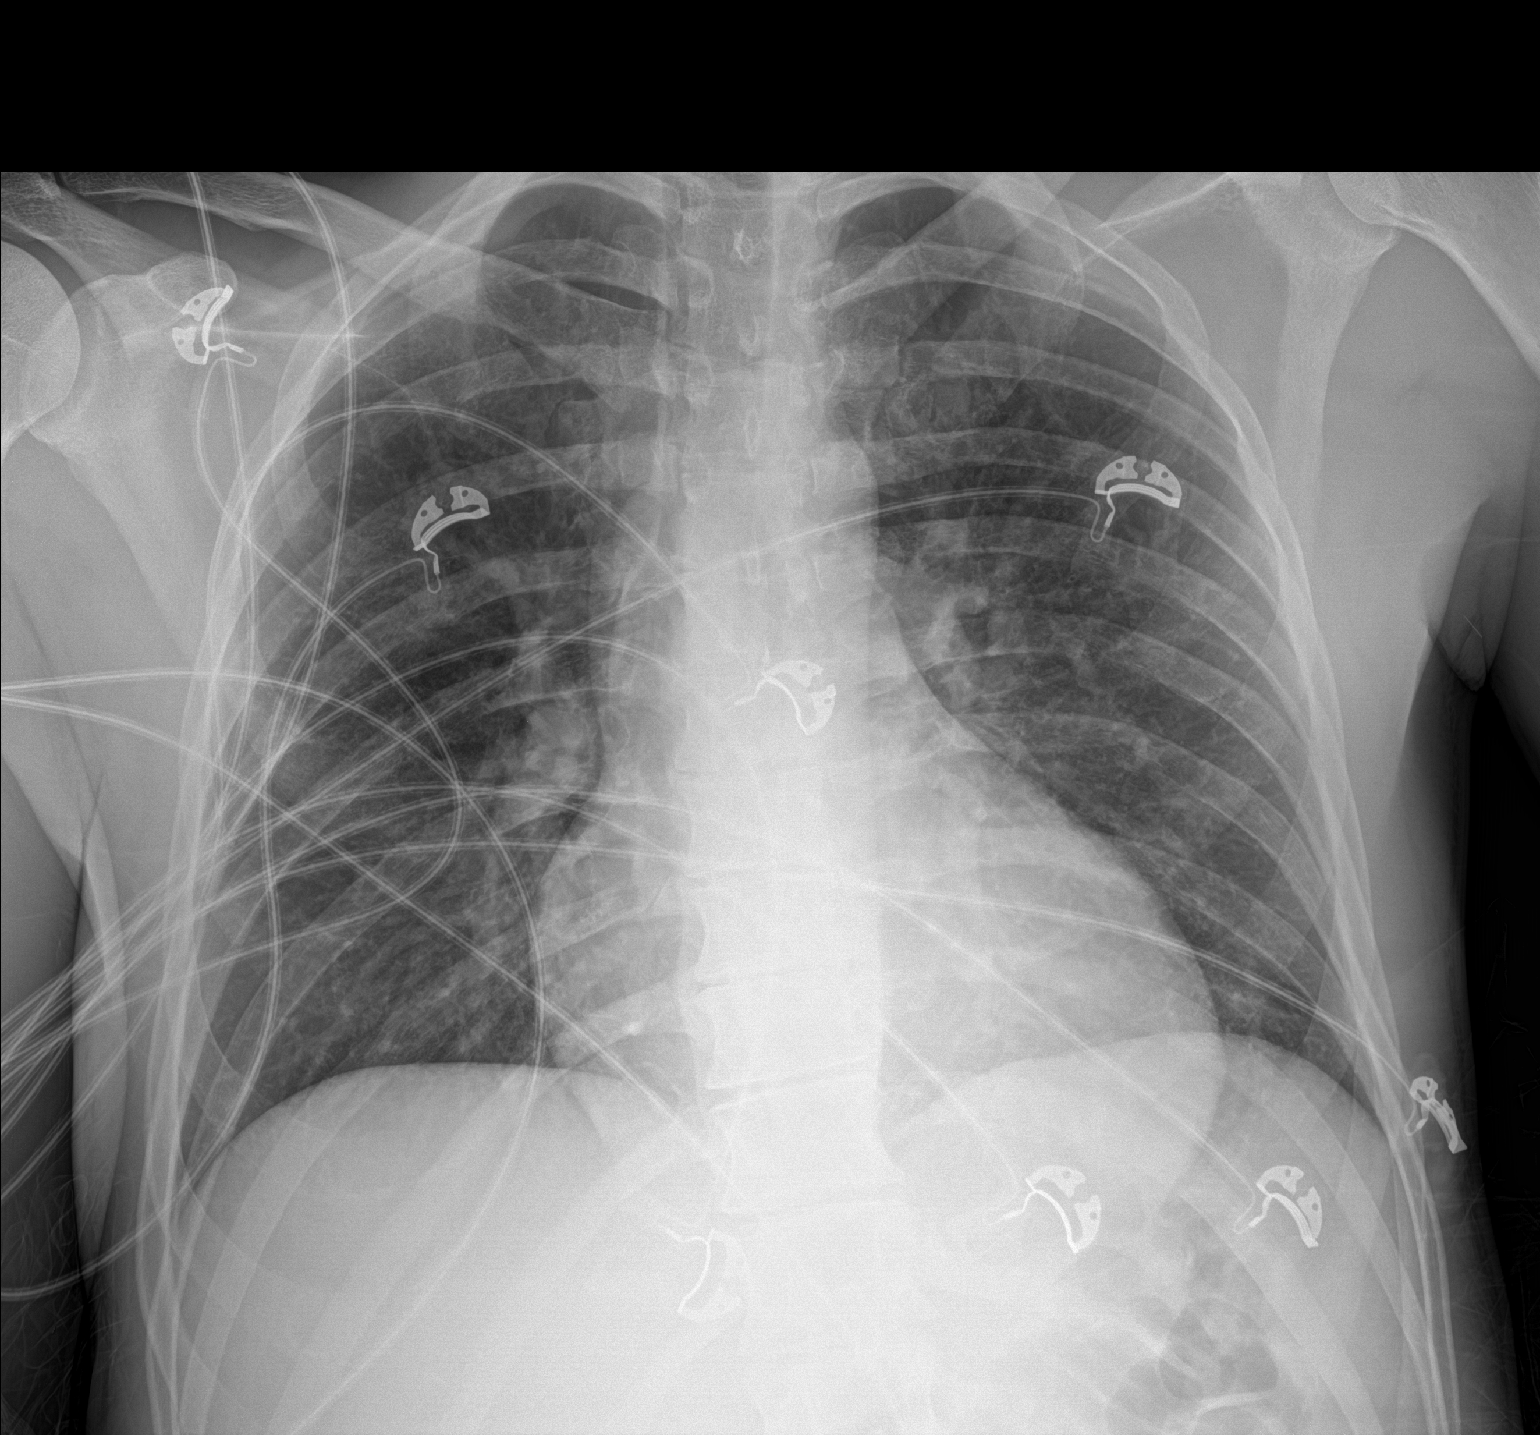

[1 of 1 positions shown; findings below may reference images not displayed]

FINDINGS: Low lung volumes. Prominent heart size likely accentuated by low
lung volumes. Vague opacity in the right mid lung as well as
questionable right perihilar opacity, partially obscured by
overlying EKG leads. No pneumothorax or pleural effusion. No
pulmonary edema. No acute osseous abnormalities are seen on this
portable AP view.
IMPRESSION: 1. Low lung volumes with vague opacity in the right mid lung and
questionable right perihilar opacity. Findings may represent
pneumonia in the appropriate clinical setting.
2. Prominent heart size likely accentuated by low lung volumes.
Consider PA and lateral views when patient is able.

## 2020-03-04 IMAGING — MR MR CERVICAL SPINE WO/W CM
5 of 7 series · 28 of 48 positions shown · IV contrast (gadavist)
Comparison: [DATE].  [DATE].  [DATE].

CLINICAL DATA: Acute neck pain with suspicion of infection.

EXAM:
MRI CERVICAL SPINE WITHOUT AND WITH CONTRAST
TECHNIQUE: Multiplanar and multiecho pulse sequences of the cervical spine, to
include the craniocervical junction and cervicothoracic junction,
were obtained without and with intravenous contrast.
CONTRAST:  7.5mL GADAVIST GADOBUTROL 1 MMOL/ML IV SOLN

[Series 2: T1 · sagittal · 3.0mm · 0.62mm/px · 4 of 18 slices shown (1 of 2)]
[im 1/18]
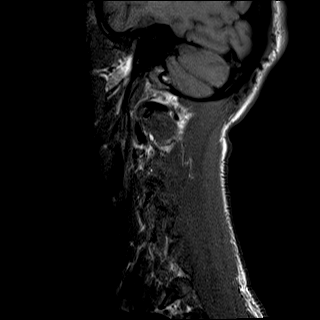
[im 6/18]
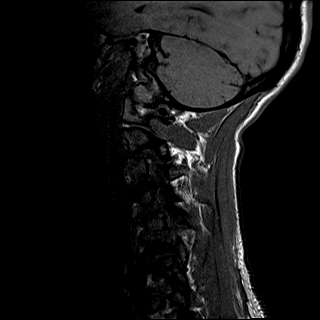
[im 12/18]
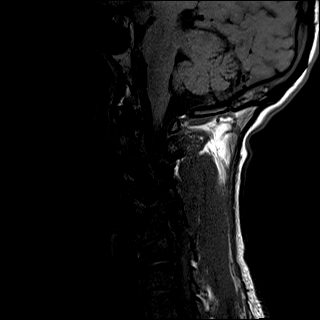
[im 18/18]
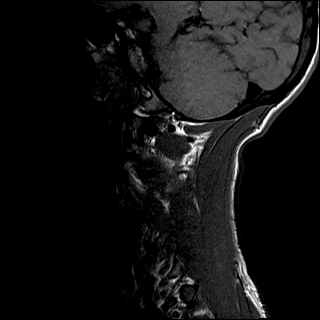

[Series 3: STIR · sagittal · 3.0mm · 0.78mm/px · 4 of 18 slices shown]
[im 1/18]
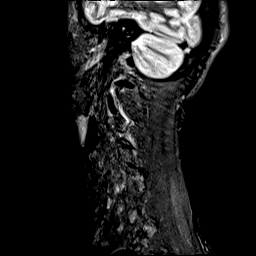
[im 6/18]
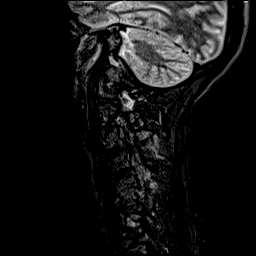
[im 12/18]
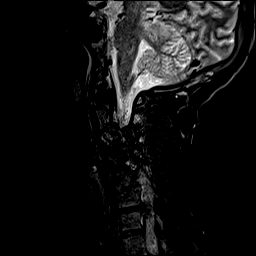
[im 18/18]
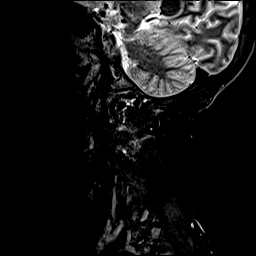

[Series 4: T2 · axial · 3.0mm · 0.66mm/px · z∈[-107,+16]mm · 9 of 42 slices shown]
[im 1/42]
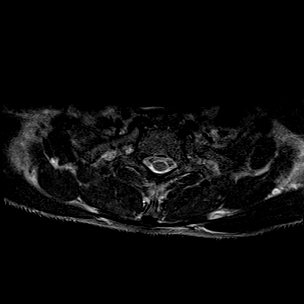
[im 6/42]
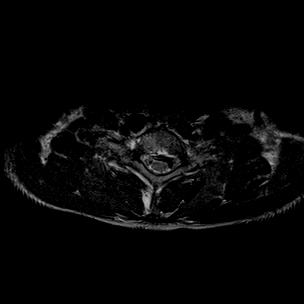
[im 11/42]
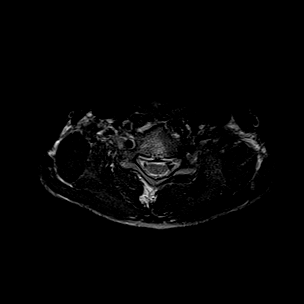
[im 16/42]
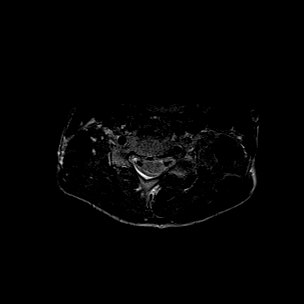
[im 21/42]
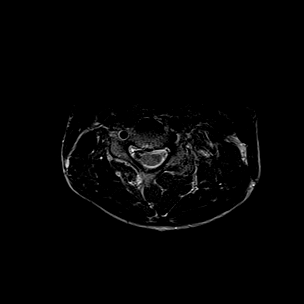
[im 26/42]
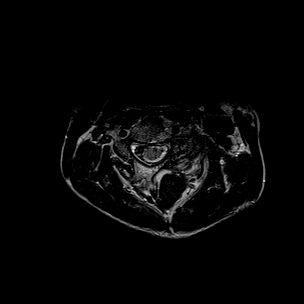
[im 31/42]
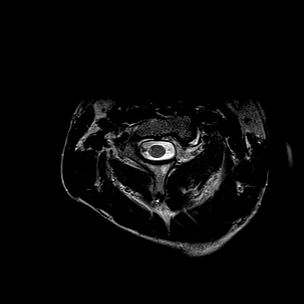
[im 36/42]
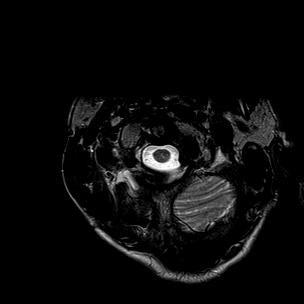
[im 42/42]
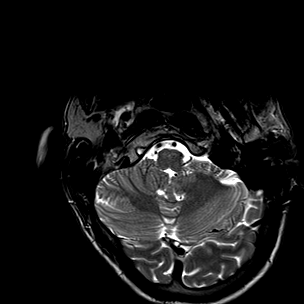

[Series 8: T1 · axial · 3.0mm · 0.35mm/px · z∈[-125,-3]mm · 9 of 42 slices shown (2 of 2)]
[im 1/42]
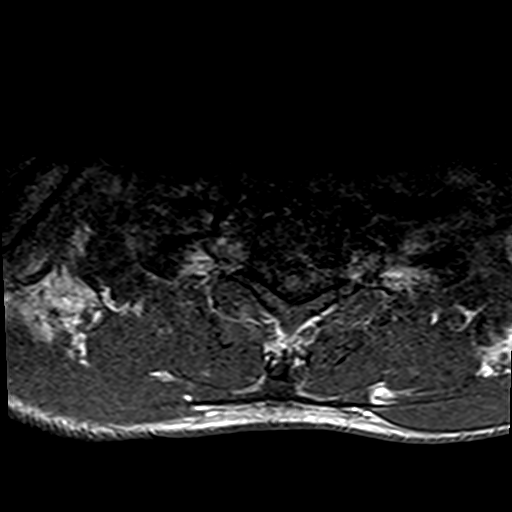
[im 6/42]
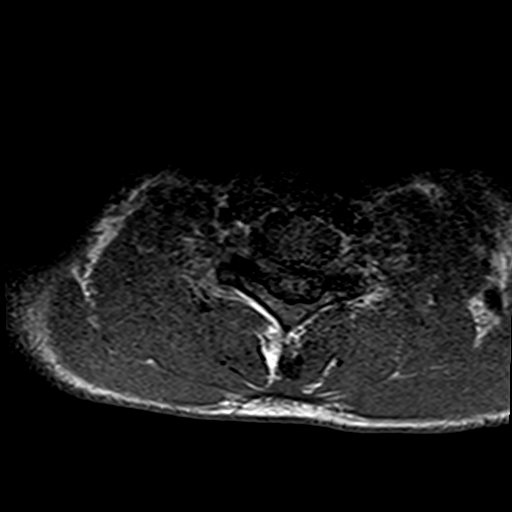
[im 11/42]
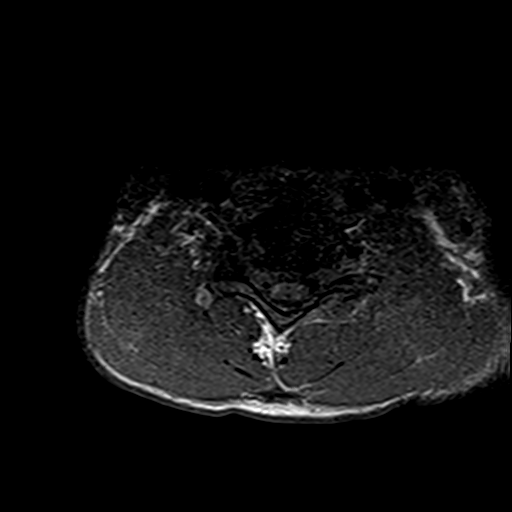
[im 16/42]
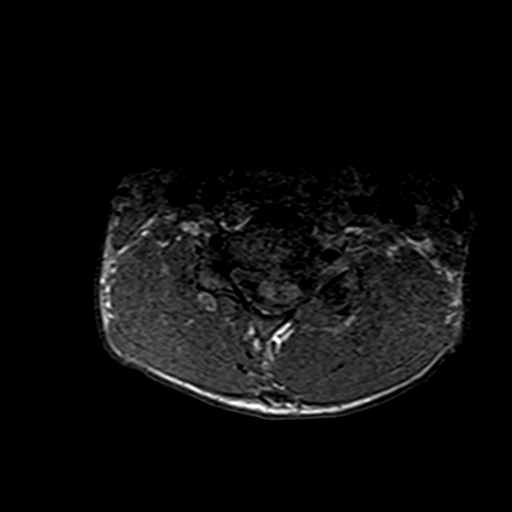
[im 21/42]
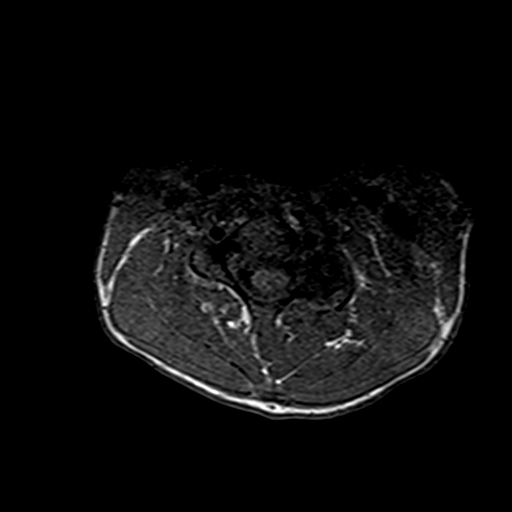
[im 26/42]
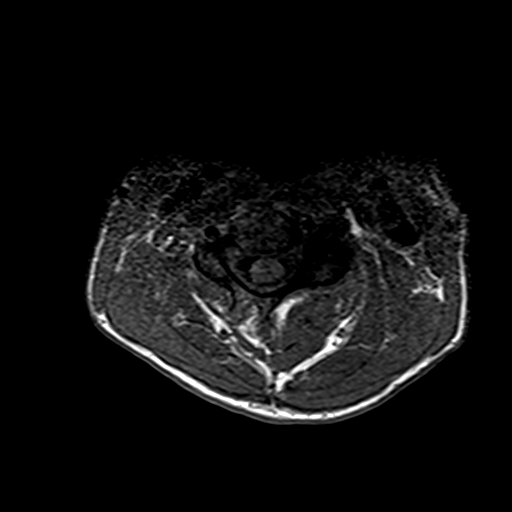
[im 31/42]
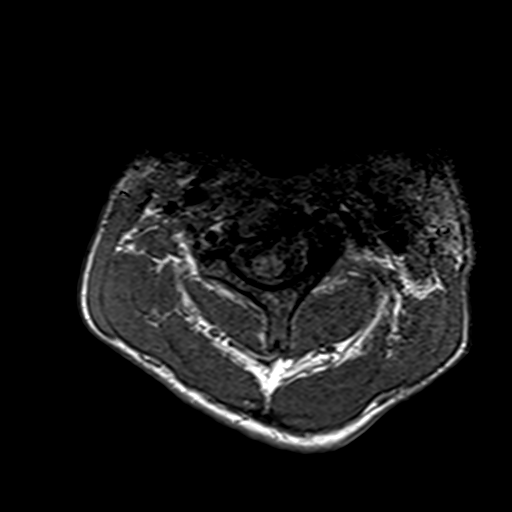
[im 36/42]
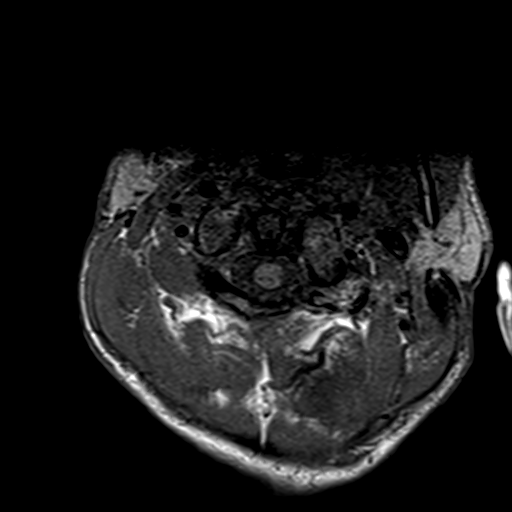
[im 42/42]
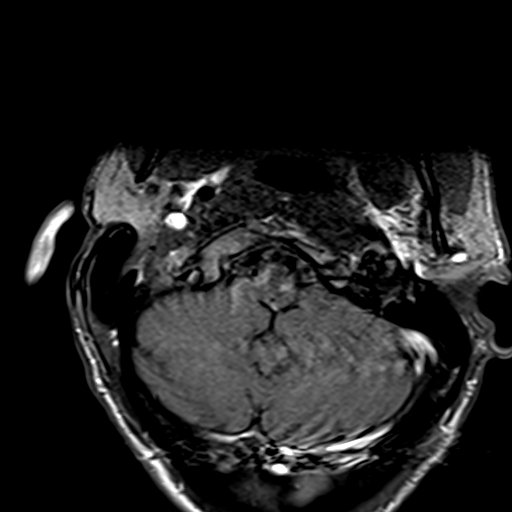

[Series 9: T1 post-contrast · sagittal · 3.0mm · 0.39mm/px · 2 of 18 slices shown]
[im 1/18]
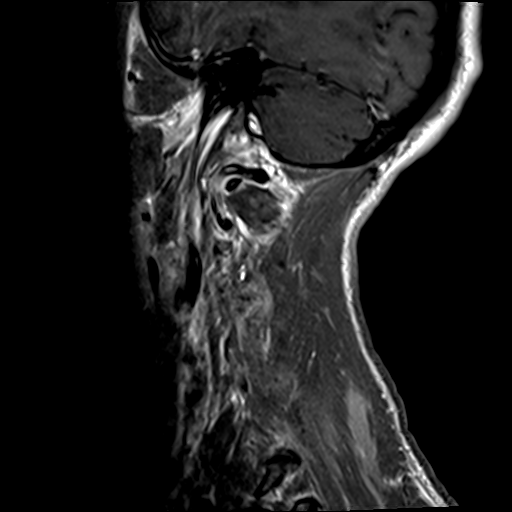
[im 6/18]
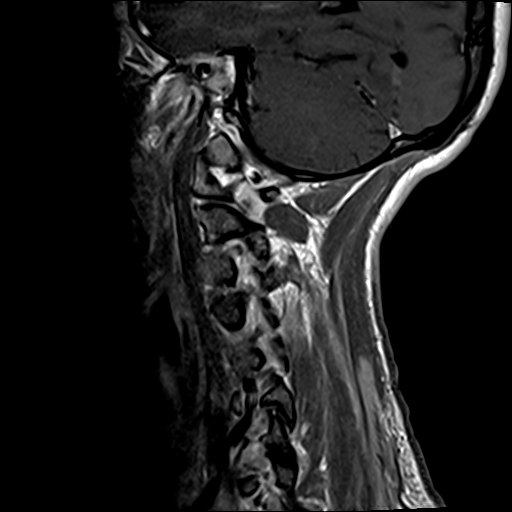

[28 of 48 positions shown; findings below may reference images not displayed]

FINDINGS: Alignment: Chronic kyphotic curvature with the apex at C5-6.

Vertebrae: See below for descriptions at each level.

Cord: No cord compression or primary cord lesion. Effacement of the
ventral subarachnoid space at the C5-6 level with slight indentation
of the ventral cord. AP diameter of the canal in the midline 8 mm.

Posterior Fossa, vertebral arteries, paraspinal tissues: Negative

Disc levels:

Foramen magnum sufficiently patent. Ordinary osteoarthritis at the
C1-2 articulation.

C2-3: Chronic facet arthropathy on the left. Endplate osteophytes
and mild bulging of the disc. Mild facet arthritis on the right.
Mild edema and enhancement. The findings are nonspecific and could
be due to degenerative arthropathy or chronic sequela of septic
facet arthritis. Left foraminal narrowing.

C3-4: Endplate osteophytes and bulging of the disc. Facet
osteoarthritis on the left. Left foraminal narrowing.

C4-5: No disc space pathology. Facet osteoarthritis on the left.
Mild left foraminal narrowing.

C5-6: Chronic loss of disc height with chronic bone fusion. Kyphotic
deformity. Vertebral body endplate encroachment upon the canal with
AP diameter of 8 mm, effacing the subarachnoid space and indenting
the ventral cord slightly. Mild bilateral foraminal narrowing, not
grossly compressive.

C6-7: Chronic disc degeneration. Endplate changes of sclerosis and
mild enhancement. Bilateral foraminal narrowing due to osteophytic
encroachment. No compressive central canal stenosis. Findings at
this level are favored to be degenerative. Cannot rule out the
possibility of resolved infection but that is not favored.

C7-T1: Endplate osteophytes and mild bulging of the disc. No
compressive stenosis.
IMPRESSION: 1. No definite sign of infection in the cervical spine.
2. Chronic degenerative disease with chronic kyphotic curvature and
chronic bone fusion at C5-6. Canal narrowing with AP diameter of 8
mm and slight indentation of the ventral cord. No abnormal cord
signal at this level. Mild bilateral foraminal narrowing, not
grossly compressive.
3. C2-3: Chronic facet arthropathy on the left. Mild edema and
enhancement. This could be due to degenerative arthropathy or
chronic sequela of septic facet arthritis. Mild foraminal narrowing
on the left.
4. C3-4: Left foraminal narrowing because of osteophytic
encroachment from facet osteoarthritis.
5. C4-5: Left foraminal narrowing because of osteophytic
encroachment from facet arthritis.
6. C6-7: Chronic disc degeneration. Endplate changes of sclerosis
and mild enhancement. No compressive central canal stenosis.
Bilateral foraminal narrowing due to osteophytic encroachment.
Findings at this level are favored to be degenerative. Cannot rule
out the possibility of resolved infection but that is not favored.

## 2020-03-04 MED ORDER — ALBUTEROL SULFATE (2.5 MG/3ML) 0.083% IN NEBU: 2.5000 mg | INHALATION_SOLUTION | RESPIRATORY_TRACT | Status: DC | PRN

## 2020-03-04 MED ORDER — OXYCODONE HCL 5 MG PO TABS
5.0000 mg | ORAL_TABLET | ORAL | Status: DC | PRN
  Administered 2020-03-05: 5 mg via ORAL
  Filled 2020-03-04: qty 1

## 2020-03-04 MED ORDER — LORAZEPAM 1 MG PO TABS
0.5000 mg | ORAL_TABLET | ORAL | Status: DC | PRN
  Administered 2020-03-05 (×2): 0.5 mg via ORAL
  Filled 2020-03-04 (×2): qty 1

## 2020-03-04 MED ORDER — LORAZEPAM 2 MG/ML IJ SOLN
1.0000 mg | Freq: Once | INTRAMUSCULAR | Status: AC
  Administered 2020-03-04: 1 mg via INTRAVENOUS
  Filled 2020-03-04: qty 1

## 2020-03-04 MED ORDER — POLYETHYLENE GLYCOL 3350 17 G PO PACK: 17.0000 g | PACK | Freq: Every day | ORAL | Status: DC | PRN

## 2020-03-04 MED ORDER — LACTATED RINGERS IV SOLN: INTRAVENOUS | Status: AC

## 2020-03-04 MED ORDER — ONDANSETRON HCL 4 MG/2ML IJ SOLN
4.0000 mg | Freq: Four times a day (QID) | INTRAMUSCULAR | Status: DC | PRN
  Administered 2020-03-05: 4 mg via INTRAVENOUS
  Filled 2020-03-04: qty 2

## 2020-03-04 MED ORDER — ACETAMINOPHEN 500 MG PO TABS
1000.0000 mg | ORAL_TABLET | Freq: Once | ORAL | Status: AC
  Administered 2020-03-04: 1000 mg via ORAL
  Filled 2020-03-04: qty 2

## 2020-03-04 MED ORDER — FOLIC ACID 1 MG PO TABS
1.0000 mg | ORAL_TABLET | Freq: Every day | ORAL | Status: DC
  Administered 2020-03-05 – 2020-03-07 (×4): 1 mg via ORAL
  Filled 2020-03-04 (×4): qty 1

## 2020-03-04 MED ORDER — PIPERACILLIN-TAZOBACTAM 3.375 G IVPB
3.3750 g | Freq: Three times a day (TID) | INTRAVENOUS | Status: DC
  Administered 2020-03-05 – 2020-03-06 (×5): 3.375 g via INTRAVENOUS
  Filled 2020-03-04 (×5): qty 50

## 2020-03-04 MED ORDER — VANCOMYCIN HCL 1250 MG/250ML IV SOLN
1250.0000 mg | Freq: Two times a day (BID) | INTRAVENOUS | Status: DC
  Administered 2020-03-05 – 2020-03-07 (×4): 1250 mg via INTRAVENOUS
  Filled 2020-03-04 (×7): qty 250

## 2020-03-04 MED ORDER — ONDANSETRON HCL 4 MG PO TABS: 4.0000 mg | ORAL_TABLET | Freq: Four times a day (QID) | ORAL | Status: DC | PRN

## 2020-03-04 MED ORDER — MORPHINE SULFATE (PF) 4 MG/ML IV SOLN
4.0000 mg | Freq: Once | INTRAVENOUS | Status: AC
  Administered 2020-03-04: 4 mg via INTRAVENOUS
  Filled 2020-03-04: qty 1

## 2020-03-04 MED ORDER — VANCOMYCIN HCL 1250 MG/250ML IV SOLN
1250.0000 mg | Freq: Once | INTRAVENOUS | Status: AC
  Administered 2020-03-05: 1250 mg via INTRAVENOUS
  Filled 2020-03-04: qty 250

## 2020-03-04 MED ORDER — PIPERACILLIN-TAZOBACTAM 3.375 G IVPB 30 MIN
3.3750 g | Freq: Once | INTRAVENOUS | Status: AC
  Administered 2020-03-04: 3.375 g via INTRAVENOUS
  Filled 2020-03-04: qty 50

## 2020-03-04 MED ORDER — ENOXAPARIN SODIUM 40 MG/0.4ML ~~LOC~~ SOLN
40.0000 mg | SUBCUTANEOUS | Status: DC
  Administered 2020-03-05 – 2020-03-06 (×3): 40 mg via SUBCUTANEOUS
  Filled 2020-03-04 (×3): qty 0.4

## 2020-03-04 MED ORDER — ACETAMINOPHEN 650 MG RE SUPP: 650.0000 mg | Freq: Four times a day (QID) | RECTAL | Status: DC | PRN

## 2020-03-04 MED ORDER — ACETAMINOPHEN 325 MG PO TABS: 650.0000 mg | ORAL_TABLET | Freq: Four times a day (QID) | ORAL | Status: DC | PRN

## 2020-03-04 MED ORDER — SODIUM CHLORIDE 0.9 % IV BOLUS (SEPSIS)
1000.0000 mL | Freq: Once | INTRAVENOUS | Status: AC
  Administered 2020-03-04: 1000 mL via INTRAVENOUS

## 2020-03-04 MED ORDER — THIAMINE HCL 100 MG PO TABS
100.0000 mg | ORAL_TABLET | Freq: Every day | ORAL | Status: DC
  Administered 2020-03-05 – 2020-03-07 (×4): 100 mg via ORAL
  Filled 2020-03-04 (×4): qty 1

## 2020-03-04 MED ORDER — GADOBUTROL 1 MMOL/ML IV SOLN
7.5000 mL | Freq: Once | INTRAVENOUS | Status: AC | PRN
  Administered 2020-03-04: 7.5 mL via INTRAVENOUS

## 2020-03-04 NOTE — H&P (Signed)
History and Physical  Patient Name: Daniel Raymond     OAC:[id number]    DOB: 1985/08/11    DOA: 03/04/2020 PCP: Patient, No Pcp Per  Patient [place] from: [place] streets, patient is homeless  Chief Complaint: Severe upper back pain    HPI: Daniel Raymond is a 35 y.o. male with hx of substance abuse, hepatitis C who presented to the ER on 03/04/2020 secondary to significant back pain.  At the time my exam, patient is lethargic, and uncooperative, thus a complete and thorough HPI review of systems unable to be obtained.  Patient just received Ativan and morphine so suspect that this is the reason for somnolence.  Patient was diagnosed with osteomyelitis of the cervical spine several months ago, appears to have been diagnosed in June 2021 at [facility].  Suspected source related to his IV drug use, uses heroin, methamphetamine, and marijuana.  Unfortunately he continues to use.  He has had multiple hospitalizations for similar presentations.  He gets started on IV antibiotics however eventually leaves AMA.  He was last hospitalized at [facility] in November 2021.  Patient had a similar presentation at outside facility on 01/29/2020.  CT scan of the spine done showed concern for severe erosive changes of C2-C3 with thought of ongoing septic facet joint infection.  He was scheduled to be transferred to [facility] for higher level care however patient left AMA.   CT cervical spine obtained on 01/29/2020 at outside facility: CONCLUSION:   1. In comparison to CT dated 10/25/2019, there is increased conspicuity of severe degenerative/erosive changes of the left C2/C3 facet joint concerning for ongoing septic facet joint.  2. Similar sequela of prior of the C5-6 intervertebral space including advanced degenerative changes, C5 vertebral body height loss, and focal kyphosis.    ED course: -Vitals on initial presentation: Temperature 102.8 F, heart rate 119, respiratory rate 20, blood  pressure 98/59, maintaining sats on room air -Labs on initial presentation: Sodium 134, potassium 3.9, chloride 99, bicarb 26, glucose under 132, BUN 16, creatinine 0.91, calcium 9.4, albumin 3.9, AST 45, ALT 38, WBC 9.7, hemoglobin 9.2 -Patient was given Tylenol, Ativan, NS bolus, morphine, Zosyn, vancomycin -Chest x-ray unrevealing.  MRI of the thoracic and cervical spine ordered in ED.     ROS: Unable to obtain due to patient's status      Past Medical History:  Diagnosis Date  . Hepatitis C   . IV drug abuse ([place])   . Polysubstance dependence including opioid type drug with complication, episodic abuse, with unspecified complication ([place])   . Subacute osteomyelitis of cervical spine (HCC)   . Tobacco dependence     Past Surgical History:  Procedure Laterality Date  . ANKLE SURGERY      Social History: Patient is homeless.  The patient walks without assistance.  Current smoker.  Allergies  Allergen Reactions  . Ibuprofen Other (See Comments), Nausea Only and Nausea And Vomiting    Stomach pains    Family history: family history is not on file.  Prior to Admission medications   Not on File       Physical Exam: BP (!) 117/93   Pulse 84   Temp 99.3 F (37.4 C) (Oral)   Resp 18   SpO2 97%  General appearance: Disheveled appearing adult male, no significant distress, lethargic, resting comfortably though Eyes: Anicteric, conjunctiva pink, lids and lashes normal. PERRL.    ENT: No nasal deformity, discharge, epistaxis. Neck: No neck masses.  Trachea  midline.  No thyromegaly/tenderness. Lymph: No cervical or supraclavicular lymphadenopathy. Skin: Multiple track marks noted Cardiac: RRR, nl S1-S2, flow Murmurs appreciated.  No LE edema.  Radial and pedal pulses 2+ and symmetric. Respiratory: Normal respiratory rate and rhythm.  CTAB without rales or wheezes. Abdomen: Abdomen soft, nontender. No ascites, distension, hepatosplenomegaly.   MSK: No deformities or  effusions of the large joints of the upper or lower extremities bilaterally.  Neuro: Unable to fully assess due to patient's lethargy Psych: Lethargic     Labs on Admission:  I have personally reviewed following labs and imaging studies: CBC: Recent Labs  Lab 03/04/20 1823  WBC 9.7  NEUTROABS 8.1*  HGB 9.2*  HCT 29.7*  MCV 86.1  PLT 119   Basic Metabolic Panel: Recent Labs  Lab 03/04/20 1823  NA 134*  K 3.9  CL 99  CO2 26  GLUCOSE 132*  BUN 16  CREATININE 0.91  CALCIUM 9.4   GFR: CrCl cannot be calculated (Unknown ideal weight.).  Liver Function Tests: Recent Labs  Lab 03/04/20 1823  AST 45*  ALT 38  ALKPHOS 98  BILITOT 0.6  PROT 7.0  ALBUMIN 3.9   No results for input(s): LIPASE, AMYLASE in the last 168 hours. No results for input(s): AMMONIA in the last 168 hours. Coagulation Profile: Recent Labs  Lab 03/04/20 1903  INR 1.0   Cardiac Enzymes: No results for input(s): CKTOTAL, CKMB, CKMBINDEX, TROPONINI in the last 168 hours. BNP (last 3 results) No results for input(s): PROBNP in the last 8760 hours. HbA1C: No results for input(s): HGBA1C in the last 72 hours. CBG: No results for input(s): GLUCAP in the last 168 hours. Lipid Profile: No results for input(s): CHOL, HDL, LDLCALC, TRIG, CHOLHDL, LDLDIRECT in the last 72 hours. Thyroid Function Tests: No results for input(s): TSH, T4TOTAL, FREET4, T3FREE, THYROIDAB in the last 72 hours. Anemia Panel: No results for input(s): VITAMINB12, FOLATE, FERRITIN, TIBC, IRON, RETICCTPCT in the last 72 hours.   No results found for this or any previous visit (from the past 240 hour(s)).         Radiological Exams on Admission: Personally reviewed chest x-ray: No grossly acute findings noted DG Chest Port 1 View  Result Date: 03/04/2020 CLINICAL DATA:  Questionable sepsis - evaluate for abnormality Patient reports hospitalization months ago for spinal infection/osteomyelitis and left AMA. Severe upper  back pain. EXAM: PORTABLE CHEST 1 VIEW COMPARISON:  Radiograph 07/31/2017 FINDINGS: Low lung volumes. Prominent heart size likely accentuated by low lung volumes. Vague opacity in the right mid lung as well as questionable right perihilar opacity, partially obscured by overlying EKG leads. No pneumothorax or pleural effusion. No pulmonary edema. No acute osseous abnormalities are seen on this portable AP view. IMPRESSION: 1. Low lung volumes with vague opacity in the right mid lung and questionable right perihilar opacity. Findings may represent pneumonia in the appropriate clinical setting. 2. Prominent heart size likely accentuated by low lung volumes. Consider PA and lateral views when patient is able. Electronically Signed   By: Keith Rake M.D.   On: 03/04/2020 19:13      Assessment/Plan   1.  Sepsis, likely secondary to osteomyelitis -Initial presentation, febrile (39.3 C), tachycardic, heart rate 119 -Previous imaging concerning for cervical osteomyelitis.  Has had an adequate antimicrobial treatment given patient leaving AMA -Continue Vanco and Zosyn for now.  Pharmacy consulted to help with vancomycin dosing -MRI of thoracic and cervical spine ordered in the ED -Added on ESR and CRP -  Blood cultures obtained on admission -Echocardiogram ordered given murmur on exam and concern for bacteremia  2.  Concern for cervical osteomyelitis -CT of cervical spine obtained on 01/29/2020 reviewed, severe degenerative/erosive changes in the C2/C3 facet joint concerning for ongoing septic joint -See further plans above  3.  Substance abuse -Reported history of methamphetamines, heroin, marijuana -Urine drug screen is pending -Ativan as needed for agitation -Oxycodone as needed for pain. -Recommend providing resources for help with substance abuse  4.  Tobacco abuse -Recommend cessation -Consider nicotine patch if patient allows    DVT prophylaxis: Lovenox Code Status: Full Family  Communication: None Disposition Plan: Anticipate patient will leave AMA.  But ideally would plan to discharge to substance abuse rehab if patient willing Consults called: None Admission status: Inpatient     Medical decision making: Patient seen at 8:04 PM on 03/04/2020.  The patient was discussed with ER provider.  What exists of the patient's chart was reviewed in depth and summarized above.  Clinical condition: Fair.        Doran Heater [facility] Please page though [place] or Epic secure chat:  For password, contact charge nurse

## 2020-03-04 NOTE — ED Triage Notes (Signed)
Pt states he was in the hospital in October for a spinal infection/osteomyelitis and left AMA 10 days early.  Reports severe upper back pain.

## 2020-03-04 NOTE — Progress Notes (Signed)
Pt being followed by [facility] for sepsis protocol. 

## 2020-03-04 NOTE — ED Notes (Signed)
Pt transported to MRI 

## 2020-03-04 NOTE — ED Provider Notes (Signed)
[facility] EMERGENCY DEPARTMENT Provider Note   CSN: [id number] Arrival date & time: 03/04/20  1705     History Chief Complaint  Patient presents with  . Back Pain    Daniel Raymond is a 35 y.o. male.  HPI   This patient is a 35 year old male with a very complicated medical history.  Unfortunately he has succumbed to IV drug abuse multiple times and is an ongoing addict to medication such as heroin, methamphetamine and marijuana which she seems to think are his 3 main drugs of abuse.  He has been using IV drugs for years, he had unfortunately developed osteomyelitis of his cervical spine which appears to have been initially diagnosed in September 2021 during which time he was admitted to the hospital.  He reports that he had left AMA and has bounced back and forth to different ER's over the last few months and most recently left AMA in January from an OSH for the same reasons -he is not currently taking antibiotics, he is currently homeless and states that he is living in [place] on the streets.  He had been in this hospital in the past but left AGAINST MEDICAL ADVICE.  He states that he now wants help.  He has been febrile, shaky, he had to hitchhike here and states that he cannot sit still because he feels extremely agitated.  He denies taking any alcohol and does not use benzodiazepines.  He reports his last use of IV drugs was heroin and methamphetamine which he last used 3 days ago.  He currently uses his left external jugular vein as he states he does not have any other veins left.  He is not on antibiotics.  He is critically ill.  Past Medical History:  Diagnosis Date  . Hepatitis C   . IV drug abuse (HCC)   . Polysubstance dependence including opioid type drug with complication, episodic abuse, with unspecified complication (HCC)   . Subacute osteomyelitis of cervical spine (HCC)   . Tobacco dependence     Patient Active Problem List   Diagnosis Date Noted  .  Sepsis (HCC) 03/04/2020  . Methamphetamine dependence, continuous (HCC) 11/21/2019  . IV drug abuse (HCC)   . Polysubstance dependence including opioid type drug with complication, episodic abuse, with unspecified complication (HCC)   . Tobacco dependence   . Hepatitis C   . Subacute osteomyelitis of cervical spine [facility])     Past Surgical History:  Procedure Laterality Date  . ANKLE SURGERY         No family history on file.  Social History   Tobacco Use  . Smoking status: Current Every Day Smoker    Packs/day: 1.00    Years: 21.00    Pack years: 21.00    Types: Cigarettes  . Smokeless tobacco: Never Used  Vaping Use  . Vaping Use: Never used  Substance Use Topics  . Alcohol use: Not Currently  . Drug use: Yes    Types: Methamphetamines, Heroin, Marijuana    Home Medications Prior to Admission medications   Not on File    Allergies    Ibuprofen  Review of Systems   Review of Systems  All other systems reviewed and are negative.   Physical Exam Updated Vital Signs BP (!) 117/93   Pulse 84   Temp 99.3 F (37.4 C) (Oral)   Resp 18   SpO2 97%   Physical Exam Vitals and nursing note reviewed.  Constitutional:  General: He is in acute distress.     Appearance: He is well-developed and well-nourished. He is ill-appearing and diaphoretic.  HENT:     Head: Normocephalic and atraumatic.     Mouth/Throat:     Mouth: Oropharynx is clear and moist. Mucous membranes are moist.     Pharynx: No oropharyngeal exudate.  Eyes:     General: No scleral icterus.       Right eye: No discharge.        Left eye: No discharge.     Extraocular Movements: EOM normal.     Conjunctiva/sclera: Conjunctivae normal.     Pupils: Pupils are equal, round, and reactive to light.  Neck:     Thyroid: No thyromegaly.     Vascular: No JVD.     Comments: Track marks following the left external jugular vein Cardiovascular:     Rate and Rhythm: Regular rhythm. Tachycardia  present.     Pulses: Intact distal pulses.     Heart sounds: Murmur ( Soft systolic murmur auscultated) heard.  No friction rub. No gallop.      Comments: Tachycardic to 120 bpm, palpable radial artery pulses Pulmonary:     Effort: Respiratory distress present.     Breath sounds: Normal breath sounds. No wheezing or rales.     Comments: Lungs clear but patient is tachypneic Abdominal:     General: Bowel sounds are normal. There is no distension.     Palpations: Abdomen is soft. There is no mass.     Tenderness: There is no abdominal tenderness.  Musculoskeletal:        General: Tenderness ( Tender to palpation over the cervical spine as well as the upper thoracic spine) present. No deformity or edema. Normal range of motion.     Cervical back: Normal range of motion and neck supple.     Right lower leg: No edema.     Left lower leg: No edema.  Lymphadenopathy:     Cervical: No cervical adenopathy.  Skin:    General: Skin is warm.     Findings: No erythema or rash.     Comments: Track marks present  Neurological:     Mental Status: He is alert.     Coordination: Coordination normal.     Comments: The patient seems to be very agitated he is moving all 4 extremities, he is able to sit himself up in the bed and calm down enough for me to examine him but seems to be very restless.  He does not have any focal weakness, he does not have any facial droop, he is speaking clearly in sentences and his memory appears to be intact  Psychiatric:        Mood and Affect: Mood and affect normal.     Comments: Agitated     ED Results / Procedures / Treatments   Labs (all labs ordered are listed, but only abnormal results are displayed) Labs Reviewed  COMPREHENSIVE METABOLIC PANEL - Abnormal; Notable for the following components:      Result Value   Sodium 134 (*)    Glucose, Bld 132 (*)    AST 45 (*)    All other components within normal limits  CBC WITH DIFFERENTIAL/PLATELET - Abnormal;  Notable for the following components:   RBC 3.45 (*)    Hemoglobin 9.2 (*)    HCT 29.7 (*)    RDW 16.9 (*)    Neutro Abs 8.1 (*)    All other  components within normal limits  RESP PANEL BY RT-PCR (FLU A&B, COVID) ARPGX2  CULTURE, BLOOD (ROUTINE X 2)  CULTURE, BLOOD (ROUTINE X 2)  URINE CULTURE  LACTIC ACID, PLASMA  PROTIME-INR  APTT  LACTIC ACID, PLASMA  URINALYSIS, ROUTINE W REFLEX MICROSCOPIC  ETHANOL  RAPID URINE DRUG SCREEN, HOSP PERFORMED  TSH    EKG None  Radiology DG Chest Port 1 View  Result Date: 03/04/2020 CLINICAL DATA:  Questionable sepsis - evaluate for abnormality Patient reports hospitalization months ago for spinal infection/osteomyelitis and left AMA. Severe upper back pain. EXAM: PORTABLE CHEST 1 VIEW COMPARISON:  Radiograph 07/31/2017 FINDINGS: Low lung volumes. Prominent heart size likely accentuated by low lung volumes. Vague opacity in the right mid lung as well as questionable right perihilar opacity, partially obscured by overlying EKG leads. No pneumothorax or pleural effusion. No pulmonary edema. No acute osseous abnormalities are seen on this portable AP view. IMPRESSION: 1. Low lung volumes with vague opacity in the right mid lung and questionable right perihilar opacity. Findings may represent pneumonia in the appropriate clinical setting. 2. Prominent heart size likely accentuated by low lung volumes. Consider PA and lateral views when patient is able. Electronically Signed   By: Narda Rutherford M.D.   On: 03/04/2020 19:13    Procedures .Critical Care Performed by: Eber Hong, MD Authorized by: Eber Hong, MD   Critical care provider statement:    Critical care time (minutes):  35   Critical care time was exclusive of:  Separately billable procedures and treating other patients and teaching time   Critical care was necessary to treat or prevent imminent or life-threatening deterioration of the following conditions:  Sepsis   Critical care  was time spent personally by me on the following activities:  Blood draw for specimens, development of treatment plan with patient or surrogate, discussions with consultants, evaluation of patient's response to treatment, examination of patient, obtaining history from patient or surrogate, ordering and performing treatments and interventions, ordering and review of laboratory studies, ordering and review of radiographic studies, pulse oximetry, re-evaluation of patient's condition and review of old charts Comments:        Ultrasound ED Peripheral IV (Provider)  Date/Time: 03/04/2020 7:02 PM Performed by: Eber Hong, MD Authorized by: Eber Hong, MD   Procedure details:    Indications: hypotension, multiple failed IV attempts and poor IV access     Skin Prep: chlorhexidine gluconate     Location:  Right forearm   Angiocath:  18 G   Bedside Ultrasound Guided: No     Patient tolerated procedure without complications: Yes     Dressing applied: Yes   Comments:       Ultrasound ED Peripheral IV (Provider)  Date/Time: 03/04/2020 7:02 PM Performed by: Eber Hong, MD Authorized by: Eber Hong, MD   Procedure details:    Indications: hypotension, multiple failed IV attempts and poor IV access     Skin Prep: chlorhexidine gluconate     Location:  Left anterior forearm   Angiocath:  20 G   Bedside Ultrasound Guided: No     Patient tolerated procedure without complications: Yes     Dressing applied: Yes   Comments:           Medications Ordered in ED Medications  lactated ringers infusion ( Intravenous New Bag/Given 03/04/20 1947)  vancomycin (VANCOREADY) IVPB 1250 mg/250 mL (has no administration in time range)  sodium chloride 0.9 % bolus 1,000 mL (1,000 mLs Intravenous New Bag/Given 03/04/20  1913)  piperacillin-tazobactam (ZOSYN) IVPB 3.375 g (3.375 g Intravenous New Bag/Given 03/04/20 1918)  morphine 4 MG/ML injection 4 mg (4 mg Intravenous Given 03/04/20 1915)   LORazepam (ATIVAN) injection 1 mg (1 mg Intravenous Given 03/04/20 1914)  acetaminophen (TYLENOL) tablet 1,000 mg (1,000 mg Oral Given 03/04/20 1920)    ED Course  I have reviewed the triage vital signs and the nursing notes.  Pertinent labs & imaging results that were available during my care of the patient were reviewed by me and considered in my medical decision making (see chart for details).    MDM Rules/Calculators/A&P                          The differential diagnosis of this patient primarily surrounds infection and sepsis.  Given that he is an ongoing IV drug user I would be concerned about septic sources such as septic pulmonary emboli, ongoing septic arthritis, osteomyelitis, discitis, COVID-19, this is complicated by likely withdrawal symptoms from his chronic drug use.  His temperature is 102.8 and a blood pressure of 98/59 with a pulse of 120 making him meet the criteria for sepsis.  Sepsis order set was used, blood cultures ordered, lactic acid ordered, the patient will be given IV fluids at 30 cc/kg and antibiotics will be ordered broad-spectrum including vancomycin and Zosyn.  I have had a very bold and forward discussion with the patient regarding his poor outcome should he decide to leave AGAINST MEDICAL ADVICE and he has agreed to stay in the hospital.  I will give him a dose of morphine and Ativan to help him calm down and feel more comfortable.  **Please note that IV lines were placed without ultrasound, despite procedure header above.  The patient is started to receive antibiotics, heart rate is coming down, blood pressure is going up, temperature has defervesced, will go to MRI and be admitted to the hospitalist service.  He is critically ill   Final Clinical Impression(s) / ED Diagnoses Final diagnoses:  Osteomyelitis of cervical spine (HCC)  Sepsis, due to unspecified organism, unspecified whether acute organ dysfunction present [facility])      Eber Hong,  MD 03/04/20 1952

## 2020-03-04 NOTE — ED Notes (Signed)
Pt is in MRI at this time.

## 2020-03-04 NOTE — Progress Notes (Addendum)
Pharmacy Antibiotic Note  Daniel Raymond is a 35 y.o. male admitted on 03/04/2020 with sepsis.  Pharmacy has been consulted for vancomycin dosing.  Has hx of spinal infection/osteomyelitis - left AMA in Oct 2021 on linezolid/levaquin at that time. Now presenting with back pain today - WBC WNL, LA 1.2, Tmax 102.8. Scr 0.9 (CrCl>100 mL/min). Going to MRI.  Plan: Vancomycin 1250 mg IV every 12 hours (est AUC 462, using Scr 0.9) Zosyn 3.375g IV every 8 hours Monitor renal fx, cx results, clinical pic, and vanc levels as indicated   Weight: 72.6 kg (160 lb 0.9 oz)  Temp (24hrs), Avg:100.8 F (38.2 C), Min:99.3 F (37.4 C), Max:102.8 F (39.3 C)  Recent Labs  Lab 03/04/20 1822 03/04/20 1823 03/04/20 1903  WBC  --  9.7  --   CREATININE  --  0.91  --   LATICACIDVEN 1.2  --  1.0    Estimated Creatinine Clearance: 117.5 mL/min (by C-G formula based on SCr of 0.91 mg/dL).    Allergies  Allergen Reactions  . Ibuprofen Other (See Comments), Nausea Only and Nausea And Vomiting    Stomach pains    Antimicrobials this admission: Vancomycin 2/12 >>  Zosyn 2/12 >>   Dose adjustments this admission: N/A   Microbiology results: 2/12 BCx: sent 2/12 UCx: sent  2/12 COVID PCR: sent  Thank you for allowing pharmacy to be a part of this patient's care.  Sherron Monday, PharmD, BCCCP Clinical Pharmacist  Phone: [phone] 03/04/2020 8:25 PM  Please check AMION for all [facility]acy phone numbers After 10:00 PM, call Main Pharmacy [phone]

## 2020-03-05 ENCOUNTER — Inpatient Hospital Stay (HOSPITAL_COMMUNITY): Payer: Medicaid Other

## 2020-03-05 DIAGNOSIS — R7881 Bacteremia: Secondary | ICD-10-CM

## 2020-03-05 DIAGNOSIS — F1910 Other psychoactive substance abuse, uncomplicated: Secondary | ICD-10-CM

## 2020-03-05 DIAGNOSIS — F17200 Nicotine dependence, unspecified, uncomplicated: Secondary | ICD-10-CM

## 2020-03-05 LAB — RAPID URINE DRUG SCREEN, HOSP PERFORMED
Amphetamines: POSITIVE — AB
Barbiturates: NOT DETECTED
Benzodiazepines: NOT DETECTED
Cocaine: NOT DETECTED
Opiates: POSITIVE — AB
Tetrahydrocannabinol: NOT DETECTED

## 2020-03-05 LAB — COMPREHENSIVE METABOLIC PANEL
ALT: 30 U/L (ref 0–44)
AST: 39 U/L (ref 15–41)
Albumin: 2.9 g/dL — ABNORMAL LOW (ref 3.5–5.0)
Alkaline Phosphatase: 83 U/L (ref 38–126)
Anion gap: 8 (ref 5–15)
BUN: 12 mg/dL (ref 6–20)
CO2: 26 mmol/L (ref 22–32)
Calcium: 8.6 mg/dL — ABNORMAL LOW (ref 8.9–10.3)
Chloride: 103 mmol/L (ref 98–111)
Creatinine, Ser: 0.91 mg/dL (ref 0.61–1.24)
GFR, Estimated: >60 mL/min (ref >60)
Glucose, Bld: 116 mg/dL — ABNORMAL HIGH (ref 70–99)
Potassium: 3.5 mmol/L (ref 3.5–5.1)
Sodium: 137 mmol/L (ref 135–145)
Total Bilirubin: 1 mg/dL (ref 0.3–1.2)
Total Protein: 6 g/dL — ABNORMAL LOW (ref 6.5–8.1)

## 2020-03-05 LAB — PHOSPHORUS: Phosphorus: 2.8 mg/dL (ref 2.5–4.6)

## 2020-03-05 LAB — RESP PANEL BY RT-PCR (FLU A&B, COVID) ARPGX2
Influenza A by PCR: NEGATIVE
Influenza B by PCR: NEGATIVE
SARS Coronavirus 2 by RT PCR: NEGATIVE

## 2020-03-05 LAB — ECHOCARDIOGRAM COMPLETE
Area-P 1/2: 4.06 cm2
P 1/2 time: 335 msec
S' Lateral: 3 cm
Weight: 2560.86 oz

## 2020-03-05 LAB — URINALYSIS, ROUTINE W REFLEX MICROSCOPIC
Bilirubin Urine: NEGATIVE
Glucose, UA: NEGATIVE mg/dL
Hgb urine dipstick: NEGATIVE
Ketones, ur: NEGATIVE mg/dL
Leukocytes,Ua: NEGATIVE
Nitrite: NEGATIVE
Protein, ur: NEGATIVE mg/dL
Specific Gravity, Urine: 1.016 (ref 1.005–1.030)
pH: 5 (ref 5.0–8.0)

## 2020-03-05 LAB — MAGNESIUM: Magnesium: 1.7 mg/dL (ref 1.7–2.4)

## 2020-03-05 MED ORDER — CLONIDINE HCL 0.1 MG PO TABS: 0.1000 mg | ORAL_TABLET | ORAL | Status: DC

## 2020-03-05 MED ORDER — DICYCLOMINE HCL 20 MG PO TABS
20.0000 mg | ORAL_TABLET | Freq: Four times a day (QID) | ORAL | Status: DC | PRN
  Filled 2020-03-05: qty 1

## 2020-03-05 MED ORDER — LORAZEPAM 1 MG PO TABS
2.0000 mg | ORAL_TABLET | ORAL | Status: DC | PRN
  Administered 2020-03-05: 13:00:00 2 mg via ORAL
  Filled 2020-03-05: qty 2

## 2020-03-05 MED ORDER — CLONIDINE HCL 0.1 MG PO TABS: 0.1000 mg | ORAL_TABLET | Freq: Every day | ORAL | Status: DC

## 2020-03-05 MED ORDER — NICOTINE 14 MG/24HR TD PT24
14.0000 mg | MEDICATED_PATCH | Freq: Every day | TRANSDERMAL | Status: DC
  Administered 2020-03-05: 14 mg via TRANSDERMAL
  Filled 2020-03-05 (×3): qty 1

## 2020-03-05 MED ORDER — HALOPERIDOL LACTATE 5 MG/ML IJ SOLN
5.0000 mg | Freq: Four times a day (QID) | INTRAMUSCULAR | Status: DC | PRN
  Administered 2020-03-05: 10:00:00 5 mg via INTRAVENOUS
  Filled 2020-03-05: qty 1

## 2020-03-05 MED ORDER — BUPRENORPHINE HCL-NALOXONE HCL 2-0.5 MG SL SUBL: 2.0000 | SUBLINGUAL_TABLET | SUBLINGUAL | Status: AC | PRN

## 2020-03-05 MED ORDER — CLONIDINE HCL 0.1 MG PO TABS
0.1000 mg | ORAL_TABLET | Freq: Two times a day (BID) | ORAL | Status: DC
Start: 2020-03-05 — End: 2020-03-05
  Administered 2020-03-05: 0.1 mg via ORAL
  Filled 2020-03-05: qty 1

## 2020-03-05 MED ORDER — METHOCARBAMOL 500 MG PO TABS: 500.0000 mg | ORAL_TABLET | Freq: Three times a day (TID) | ORAL | Status: DC | PRN

## 2020-03-05 MED ORDER — OXYCODONE HCL 5 MG PO TABS
5.0000 mg | ORAL_TABLET | Freq: Four times a day (QID) | ORAL | Status: DC | PRN
  Administered 2020-03-05: 5 mg via ORAL
  Filled 2020-03-05: qty 1

## 2020-03-05 MED ORDER — LOPERAMIDE HCL 2 MG PO CAPS: 2.0000 mg | ORAL_CAPSULE | ORAL | Status: DC | PRN

## 2020-03-05 MED ORDER — TEMAZEPAM 15 MG PO CAPS: 15.0000 mg | ORAL_CAPSULE | Freq: Every evening | ORAL | Status: DC | PRN

## 2020-03-05 MED ORDER — HYDROXYZINE HCL 25 MG PO TABS: 25.0000 mg | ORAL_TABLET | Freq: Four times a day (QID) | ORAL | Status: DC | PRN

## 2020-03-05 MED ORDER — OXYCODONE HCL 5 MG PO TABS: 5.0000 mg | ORAL_TABLET | ORAL | Status: DC | PRN

## 2020-03-05 MED ORDER — CLONIDINE HCL 0.1 MG PO TABS
0.1000 mg | ORAL_TABLET | Freq: Four times a day (QID) | ORAL | Status: AC
  Administered 2020-03-05 – 2020-03-07 (×7): 0.1 mg via ORAL
  Filled 2020-03-05 (×8): qty 1

## 2020-03-05 MED ORDER — BUPRENORPHINE HCL-NALOXONE HCL 8-2 MG SL SUBL
1.0000 | SUBLINGUAL_TABLET | Freq: Two times a day (BID) | SUBLINGUAL | Status: DC
  Administered 2020-03-05 – 2020-03-07 (×3): 1 via SUBLINGUAL
  Filled 2020-03-05 (×3): qty 1

## 2020-03-05 NOTE — ED Notes (Signed)
Patient returned from echo, agitated and requesting suboxone. Patient states he is withdrawing and that RN should get his papers ready if he isn't going to be given suboxone. Patient fidgeting, restless, irritable. MD updated. PRN haldol given for agitation.

## 2020-03-05 NOTE — ED Notes (Signed)
Patient transported for echo. 

## 2020-03-05 NOTE — Progress Notes (Signed)
  Echocardiogram 2D Echocardiogram has been performed.  Delcie Roch 03/05/2020, 9:19 AM

## 2020-03-05 NOTE — ED Notes (Signed)
MS Breakfast Ordered 

## 2020-03-05 NOTE — Progress Notes (Signed)
Patient arrived to unit from ED. Patient goes from appearing completely asleep to thrashing about the bed and then falling back asleep. Patient not participating in assessment. Patient will not allow staff to place telemetry leads at this time, immediately removing them once they are placed.

## 2020-03-05 NOTE — Progress Notes (Addendum)
Progress Note    Daniel Raymond  QIW:[id number] DOB: 1985-07-16  DOA: 03/04/2020 PCP: Patient, No Pcp Per    Brief Narrative:     Medical records reviewed and are as summarized below:  Daniel Raymond is an 35 y.o. male with hx of substance abuse, hepatitis C who presented to the ER on 03/04/2020 secondary to significant back pain.  Patient was diagnosed with osteomyelitis of the cervical spine several months ago, appears to have been diagnosed in June 2021 at [facility].  Suspected source related to his IV drug use, uses heroin, methamphetamine, and marijuana.  Unfortunately he continues to use.  He has had multiple hospitalizations for similar presentations.  He gets started on IV antibiotics however eventually leaves AMA.  He was last hospitalized at [facility] in November 2021- -left AMA and said he did not take the abx given   Assessment/Plan:   Principal Problem:   Sepsis ([place]) Active Problems:   IV drug abuse ([place])   Tobacco dependence   Subacute osteomyelitis of cervical spine ([place])   Sepsis, possibly secondary to osteomyelitis -Initial presentation, febrile (39.3 C), tachycardic, heart rate 119-- could be withdrawal as well -Continue Vanco and Zosyn for now -MRI:No definite sign of infection in the cervical spine - ESR and CRP pending -Blood cultures obtained on admission -Echocardiogram: No distinct valvular vegetations visualized, however, there is  thickening of the mitral valve, aortic valve (appears bicuspid) and the  tricuspid valve (mild). Recommend TEE for further evaluation if clinical  suspicion for endocarditis is present.  - ID consult in AM   Substance abuse -Reported history of methamphetamines, heroin, marijuana -Ativan prn - can use suboxone but may hasten withdrawal -Will monitor on COWS protocol (5-12 mild symptoms; 13-24 moderate symptoms; 25-36 moderately severe symptoms; >36 severe symptoms) -TOC team consult for substance abuse  education/support -prn orders from the Clonidine withdrawal order set were also ordered.  Tobacco abuse -Recommend cessation -nicotine patch ordered if patient allows     Family Communication/Anticipated D/C date and plan/Code Status   DVT prophylaxis: Lovenox ordered. Code Status: Full Code.  Disposition Plan: Status is: Inpatient  Remains inpatient appropriate because:Inpatient level of care appropriate due to severity of illness   Dispo: The patient is from: Home              Anticipated d/c is to: Home              Anticipated d/c date is: > 3 days              Patient currently is not medically stable to d/c.   Difficult to place patient Yes    Medical Consultants:    Will consult ID in AM     Subjective:   Says he never took the abx given to him last admission in 11/21  Objective:    Vitals:   03/05/20 0430 03/05/20 0730 03/05/20 0800 03/05/20 1215  BP:  (!) 134/99 133/89 (!) 163/92  Pulse: 80 85 91 93  Resp: 19 16 20  (!) 22  Temp:      TempSrc:      SpO2: 98% 99% 100% 92%  Weight:       No intake or output data in the 24 hours ending 03/05/20 1235 Filed Weights   03/04/20 2018  Weight: 72.6 kg    Exam: Patient moving all over bed, poor eye contact  Data Reviewed:   I have personally reviewed following labs and imaging studies:  Labs: Labs show the following:   Basic Metabolic Panel: Recent Labs  Lab 03/04/20 1823 03/05/20 0505  NA 134* 137  K 3.9 3.5  CL 99 103  CO2 26 26  GLUCOSE 132* 116*  BUN 16 12  CREATININE 0.91 0.91  CALCIUM 9.4 8.6*  MG  --  1.7  PHOS  --  2.8   GFR Estimated Creatinine Clearance: 117.5 mL/min (by C-G formula based on SCr of 0.91 mg/dL). Liver Function Tests: Recent Labs  Lab 03/04/20 1823 03/05/20 0505  AST 45* 39  ALT 38 30  ALKPHOS 98 83  BILITOT 0.6 1.0  PROT 7.0 6.0*  ALBUMIN 3.9 2.9*   No results for input(s): LIPASE, AMYLASE in the last 168 hours. No results for input(s):  AMMONIA in the last 168 hours. Coagulation profile Recent Labs  Lab 03/04/20 1903  INR 1.0    CBC: Recent Labs  Lab 03/04/20 1823  WBC 9.7  NEUTROABS 8.1*  HGB 9.2*  HCT 29.7*  MCV 86.1  PLT 272   Cardiac Enzymes: No results for input(s): CKTOTAL, CKMB, CKMBINDEX, TROPONINI in the last 168 hours. BNP (last 3 results) No results for input(s): PROBNP in the last 8760 hours. CBG: No results for input(s): GLUCAP in the last 168 hours. D-Dimer: No results for input(s): DDIMER in the last 72 hours. Hgb A1c: No results for input(s): HGBA1C in the last 72 hours. Lipid Profile: No results for input(s): CHOL, HDL, LDLCALC, TRIG, CHOLHDL, LDLDIRECT in the last 72 hours. Thyroid function studies: Recent Labs    03/04/20 1903  TSH 0.713   Anemia work up: No results for input(s): VITAMINB12, FOLATE, FERRITIN, TIBC, IRON, RETICCTPCT in the last 72 hours. Sepsis Labs: Recent Labs  Lab 03/04/20 1822 03/04/20 1823 03/04/20 1903  WBC  --  9.7  --   LATICACIDVEN 1.2  --  1.0    Microbiology Recent Results (from the past 240 hour(s))  Resp Panel by RT-PCR (Flu A&B, Covid) Nasopharyngeal Swab     Status: None   Collection Time: 03/05/20  1:15 AM   Specimen: Nasopharyngeal Swab; Nasopharyngeal(NP) swabs in vial transport medium  Result Value Ref Range Status   SARS Coronavirus 2 by RT PCR NEGATIVE NEGATIVE Final    Comment: (NOTE) SARS-CoV-2 target nucleic acids are NOT DETECTED.  The SARS-CoV-2 RNA is generally detectable in upper respiratory specimens during the acute phase of infection. The lowest concentration of SARS-CoV-2 viral copies this assay can detect is 138 copies/mL. A negative result does not preclude SARS-Cov-2 infection and should not be used as the sole basis for treatment or other patient management decisions. A negative result may occur with  improper specimen collection/handling, submission of specimen other than nasopharyngeal swab, presence of viral  mutation(s) within the areas targeted by this assay, and inadequate number of viral copies(<138 copies/mL). A negative result must be combined with clinical observations, patient history, and epidemiological information. The expected result is Negative.  Fact Sheet for Patients:  EntrepreneurPulse.com.au  Fact Sheet for Healthcare Providers:  IncredibleEmployment.be  This test is no t yet approved or cleared by the [place] FDA and  has been authorized for detection and/or diagnosis of SARS-CoV-2 by FDA under an Emergency Use Authorization (EUA). This EUA will remain  in effect (meaning this test can be used) for the duration of the COVID-19 declaration under Section 564(b)(1) of the Act, 21 U.S.C.section 360bbb-3(b)(1), unless the authorization is terminated  or revoked sooner.       Influenza A  by PCR NEGATIVE NEGATIVE Final   Influenza B by PCR NEGATIVE NEGATIVE Final    Comment: (NOTE) The Xpert Xpress SARS-CoV-2/FLU/RSV plus assay is intended as an aid in the diagnosis of influenza from Nasopharyngeal swab specimens and should not be used as a sole basis for treatment. Nasal washings and aspirates are unacceptable for Xpert Xpress SARS-CoV-2/FLU/RSV testing.  Fact Sheet for Patients: EntrepreneurPulse.com.au  Fact Sheet for Healthcare Providers: IncredibleEmployment.be  This test is not yet approved or cleared by the [place] FDA and has been authorized for detection and/or diagnosis of SARS-CoV-2 by FDA under an Emergency Use Authorization (EUA). This EUA will remain in effect (meaning this test can be used) for the duration of the COVID-19 declaration under Section 564(b)(1) of the Act, 21 U.S.C. section 360bbb-3(b)(1), unless the authorization is terminated or revoked.  Performed at [facility], [place] [address], [place], [place] [address]     Procedures and diagnostic  studies:  MR Cervical Spine W or Wo Contrast  Result Date: 03/04/2020 CLINICAL DATA:  Acute neck pain with suspicion of infection. EXAM: MRI CERVICAL SPINE WITHOUT AND WITH CONTRAST TECHNIQUE: Multiplanar and multiecho pulse sequences of the cervical spine, to include the craniocervical junction and cervicothoracic junction, were obtained without and with intravenous contrast. CONTRAST:  7.67m GADAVIST GADOBUTROL 1 MMOL/ML IV SOLN COMPARISON:  12/03/2019.  11/18/2019.  05/07/2019. FINDINGS: Alignment: Chronic kyphotic curvature with the apex at C5-6. Vertebrae: See below for descriptions at each level. Cord: No cord compression or primary cord lesion. Effacement of the ventral subarachnoid space at the C5-6 level with slight indentation of the ventral cord. AP diameter of the canal in the midline 8 mm. Posterior Fossa, vertebral arteries, paraspinal tissues: Negative Disc levels: Foramen magnum sufficiently patent. Ordinary osteoarthritis at the C1-2 articulation. C2-3: Chronic facet arthropathy on the left. Endplate osteophytes and mild bulging of the disc. Mild facet arthritis on the right. Mild edema and enhancement. The findings are nonspecific and could be due to degenerative arthropathy or chronic sequela of septic facet arthritis. Left foraminal narrowing. C3-4: Endplate osteophytes and bulging of the disc. Facet osteoarthritis on the left. Left foraminal narrowing. C4-5: No disc space pathology. Facet osteoarthritis on the left. Mild left foraminal narrowing. C5-6: Chronic loss of disc height with chronic bone fusion. Kyphotic deformity. Vertebral body endplate encroachment upon the canal with AP diameter of 8 mm, effacing the subarachnoid space and indenting the ventral cord slightly. Mild bilateral foraminal narrowing, not grossly compressive. C6-7: Chronic disc degeneration. Endplate changes of sclerosis and mild enhancement. Bilateral foraminal narrowing due to osteophytic encroachment. No  compressive central canal stenosis. Findings at this level are favored to be degenerative. Cannot rule out the possibility of resolved infection but that is not favored. C7-T1: Endplate osteophytes and mild bulging of the disc. No compressive stenosis. IMPRESSION: 1. No definite sign of infection in the cervical spine. 2. 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 Canal narrowing with AP diameter of 8 mm and slight indentation of the ventral cord. No abnormal cord signal at this level. Mild bilateral foraminal narrowing, not grossly compressive. 3. C2-3: Chronic facet arthropathy on the left. Mild edema and enhancement. This could be due to degenerative arthropathy or chronic sequela of septic facet arthritis. Mild foraminal narrowing on the left. 4. C3-4: Left foraminal narrowing because of osteophytic encroachment from facet osteoarthritis. 5. C4-5: Left foraminal narrowing because of osteophytic encroachment from facet arthritis. 6. C6-7: Chronic disc degeneration. Endplate  changes of sclerosis and mild enhancement. No compressive central canal stenosis. Bilateral foraminal narrowing due to osteophytic encroachment. Findings at this level are favored to be degenerative. Cannot rule out the possibility of resolved infection but that is not favored. Electronically Signed   By: Nelson Chimes M.D.   On: 03/04/2020 22:28   MR THORACIC SPINE W WO CONTRAST  Result Date: 03/04/2020 CLINICAL DATA:  Back pain.  Question infection. EXAM: MRI THORACIC WITHOUT AND WITH CONTRAST TECHNIQUE: Multiplanar and multiecho pulse sequences of the thoracic spine were obtained without and with intravenous contrast. CONTRAST:  7.70m GADAVIST GADOBUTROL 1 MMOL/ML IV SOLN COMPARISON:  CT 02021-05-05FINDINGS: Alignment:  Mild curvature convex to the right. Vertebrae: Endplate edema and enhancement at the T9-10 level, worse on the left. Right side is preserved. Findings are favored to be  degenerative. Degenerative changes were seen on the CT of 005/05/2019 Lack 0 pronounced progression and lack of disc space T2 signal or enhancement argue against infection. No paravertebral collection. Cord:  No cord compression or primary cord lesion. Paraspinal and other soft tissues: Negative. No paravertebral inflammatory findings. Disc levels: T1-2: Normal T2-3 through T8-9: Disc bulges. No compressive stenosis of the canal or foramina. T9-10: Endplate osteophytes and shallow protrusion of the disc more prominent on the left. Narrowing of the ventral subarachnoid space but no compression of the cord. Moderate foraminal narrowing on the left could possibly affect the left T9 nerve. See above discussion, favoring degenerative change over infection. T10-11, T11-12 and T12-L1: Noncompressive disc bulges. IMPRESSION: Degenerative endplate edema and enhancement at the T9-10 level, worse on the left. Endplate osteophytes and shallow protrusion of the disc more prominent on the left. Moderate foraminal narrowing on the left could possibly affect the left T9 nerve. Degenerative change at this level is similar but slightly more pronounced than was seen on the CT of 005-05-21 I do not think the findings at this level are likely due to spinal infection. Of course, very early infection could not be excluded, and follow-up may be warranted. Electronically Signed   By: MNelson ChimesM.D.   On: 03/04/2020 22:34   DG Chest Port 1 View  Result Date: 03/04/2020 CLINICAL DATA:  Questionable sepsis - evaluate for abnormality Patient reports hospitalization months ago for spinal infection/osteomyelitis and left AMA. Severe upper back pain. EXAM: PORTABLE CHEST 1 VIEW COMPARISON:  Radiograph 07/31/2017 FINDINGS: Low lung volumes. Prominent heart size likely accentuated by low lung volumes. Vague opacity in the right mid lung as well as questionable right perihilar opacity, partially obscured by overlying EKG leads. No  pneumothorax or pleural effusion. No pulmonary edema. No acute osseous abnormalities are seen on this portable AP view. IMPRESSION: 1. Low lung volumes with vague opacity in the right mid lung and questionable right perihilar opacity. Findings may represent pneumonia in the appropriate clinical setting. 2. Prominent heart size likely accentuated by low lung volumes. Consider PA and lateral views when patient is able. Electronically Signed   By: MKeith RakeM.D.   On: 03/04/2020 19:13   ECHOCARDIOGRAM COMPLETE  Result Date: 03/05/2020    ECHOCARDIOGRAM REPORT   Patient Name:   Daniel NICKSONDate of Exam: 03/05/2020 Medical Rec #:  [id number]  Height:       70.0 in Accession #:    [id number] Weight:       160.1 lb Date of Birth:  9Sep 28, 1987  BSA:          1.899 m Patient Age:  34 years    BP:           133/89 mmHg Patient Gender: M           HR:           68 bpm. Exam Location:  Inpatient Procedure: 2D Echo Indications:    bacteremia  History:        Patient has no prior history of Echocardiogram examinations.                 Sepsis; Risk Factors:Current Smoker and IV drug use.  Sonographer:    Johny Chess Referring Phys: [id number] Orlean Bradford MACNEIL  Sonographer Comments: Image acquisition challenging due to uncooperative patient. IMPRESSIONS  1. No distinct valvular vegetations visualized, however, there is thickening of the mitral valve, aortic valve (appears bicuspid) and the tricuspid valve (mild). Recommend TEE for further evaluation if clinical suspicion for endocarditis is present.  2. Left ventricular ejection fraction, by estimation, is 60 to 65%. The left ventricle has normal function. The left ventricle has no regional wall motion abnormalities. Left ventricular diastolic parameters were normal.  3. Right ventricular systolic function is normal. The right ventricular size is normal.  4. The mitral valve is mildly thickened. Trivial mitral valve regurgitation.  5. TV appears mildly thickened.  Mild tricuspid regurgitation.  6. The aortic valve appears bicuspid. There is moderate thickening of the aortic valve. Aortic valve regurgitation is mild.  7. Aortic dilatation noted. There is borderline dilatation of the ascending aorta, measuring 36 mm.  8. The inferior vena cava is normal in size with greater than 50% respiratory variability, suggesting right atrial pressure of 3 mmHg. Comparison(s): No prior Echocardiogram. Conclusion(s)/Recommendation(s): No evidence of valvular vegetations on this transthoracic echocardiogram. Would recommend a transesophageal echocardiogram to exclude infective endocarditis if clinically indicated. FINDINGS  Left Ventricle: Left ventricular ejection fraction, by estimation, is 60 to 65%. The left ventricle has normal function. The left ventricle has no regional wall motion abnormalities. The left ventricular internal cavity size was normal in size. There is  no left ventricular hypertrophy. Left ventricular diastolic parameters were normal. Right Ventricle: The right ventricular size is normal. No increase in right ventricular wall thickness. Right ventricular systolic function is normal. Left Atrium: Left atrial size was normal in size. Right Atrium: Right atrial size was normal in size. Pericardium: There is no evidence of pericardial effusion. Mitral Valve: The mitral valve is grossly normal. There is mild thickening of the mitral valve leaflet(s). Trivial mitral valve regurgitation. Tricuspid Valve: TV appears mildly thickened. The tricuspid valve is normal in structure. Tricuspid valve regurgitation is mild. Aortic Valve: The aortic valve is bicuspid. There is moderate thickening of the aortic valve. Aortic valve regurgitation is mild. Aortic regurgitation PHT measures 335 msec. Pulmonic Valve: The pulmonic valve was normal in structure. Pulmonic valve regurgitation is trivial. Aorta: Aortic dilatation noted. There is borderline dilatation of the ascending aorta,  measuring 36 mm. Venous: The inferior vena cava is normal in size with greater than 50% respiratory variability, suggesting right atrial pressure of 3 mmHg. IAS/Shunts: No atrial level shunt detected by color flow Doppler.  LEFT VENTRICLE PLAX 2D LVIDd:         4.60 cm  Diastology LVIDs:         3.00 cm  LV e' medial:    12.40 cm/s LV PW:         1.00 cm  LV E/e' medial:  9.8 LV IVS:  0.90 cm  LV e' lateral:   16.10 cm/s LVOT diam:     2.40 cm  LV E/e' lateral: 7.5 LV SV:         121 LV SV Index:   64 LVOT Area:     4.52 cm  RIGHT VENTRICLE             IVC RV S prime:     17.20 cm/s  IVC diam: 1.80 cm TAPSE (M-mode): 3.8 cm LEFT ATRIUM             Index       RIGHT ATRIUM           Index LA diam:        3.40 cm 1.79 cm/m  RA Area:     14.40 cm LA Vol (A2C):   62.7 ml 33.02 ml/m RA Volume:   35.60 ml  18.75 ml/m LA Vol (A4C):   37.3 ml 19.64 ml/m LA Biplane Vol: 48.6 ml 25.60 ml/m  AORTIC VALVE LVOT Vmax:   134.00 cm/s LVOT Vmean:  88.600 cm/s LVOT VTI:    0.267 m AI PHT:      335 msec  AORTA Ao Root diam: 3.60 cm Ao Asc diam:  3.60 cm MITRAL VALVE MV Area (PHT): 4.06 cm     SHUNTS MV Decel Time: 187 msec     Systemic VTI:  0.27 m MV E velocity: 121.00 cm/s  Systemic Diam: 2.40 cm MV A velocity: 76.60 cm/s MV E/A ratio:  1.58 Gwyndolyn Kaufman MD Electronically signed by Gwyndolyn Kaufman MD Signature Date/Time: 03/05/2020/12:09:56 PM    Final     Medications:   . cloNIDine  0.1 mg Oral BID  . enoxaparin (LOVENOX) injection  40 mg Subcutaneous Q24H  . folic acid  1 mg Oral Daily  . thiamine  100 mg Oral Daily   Continuous Infusions: . lactated ringers 150 mL/hr at 03/04/20 1947  . piperacillin-tazobactam (ZOSYN)  IV Stopped (03/05/20 0810)  . vancomycin       LOS: 1 day   Geradine Girt  [facility]   How to contact the [facility] Attending or Consulting provider [place] or covering provider during after hours [place], for this patient?  1. Check the care team in [facility] and look for a)  attending/consulting [facility] provider listed and b) the [facility] team listed 2. Log into [email or web address] and use [place]'s universal password to access. If you do not have the password, please contact the hospital operator. 3. Locate the [facility] provider you are looking for under [facility] and page to a number that you can be directly reached. 4. If you still have difficulty reaching the provider, please page the [facility] (Director on Call) for the Hospitalists listed on amion for assistance.  03/05/2020, 12:35 PM

## 2020-03-06 DIAGNOSIS — M4622 Osteomyelitis of vertebra, cervical region: Secondary | ICD-10-CM

## 2020-03-06 DIAGNOSIS — R509 Fever, unspecified: Secondary | ICD-10-CM

## 2020-03-06 DIAGNOSIS — B1920 Unspecified viral hepatitis C without hepatic coma: Secondary | ICD-10-CM

## 2020-03-06 DIAGNOSIS — Z5900 Homelessness unspecified: Secondary | ICD-10-CM

## 2020-03-06 DIAGNOSIS — M542 Cervicalgia: Secondary | ICD-10-CM

## 2020-03-06 DIAGNOSIS — A419 Sepsis, unspecified organism: Principal | ICD-10-CM

## 2020-03-06 LAB — URINE CULTURE: Culture: NO GROWTH

## 2020-03-06 LAB — CBC
HCT: 27.8 % — ABNORMAL LOW (ref 39.0–52.0)
Hemoglobin: 9 g/dL — ABNORMAL LOW (ref 13.0–17.0)
MCH: 27.2 pg (ref 26.0–34.0)
MCHC: 32.4 g/dL (ref 30.0–36.0)
MCV: 84 fL (ref 80.0–100.0)
Platelets: 214 10*3/uL (ref 150–400)
RBC: 3.31 MIL/uL — ABNORMAL LOW (ref 4.22–5.81)
RDW: 17.1 % — ABNORMAL HIGH (ref 11.5–15.5)
WBC: 4.4 10*3/uL (ref 4.0–10.5)
nRBC: 0 % (ref 0.0–0.2)

## 2020-03-06 LAB — SEDIMENTATION RATE: Sed Rate: 25 mm/hr — ABNORMAL HIGH (ref 0–16)

## 2020-03-06 LAB — BASIC METABOLIC PANEL
Anion gap: 11 (ref 5–15)
BUN: 16 mg/dL (ref 6–20)
CO2: 22 mmol/L (ref 22–32)
Calcium: 8.7 mg/dL — ABNORMAL LOW (ref 8.9–10.3)
Chloride: 101 mmol/L (ref 98–111)
Creatinine, Ser: 0.86 mg/dL (ref 0.61–1.24)
GFR, Estimated: >60 mL/min (ref >60)
Glucose, Bld: 125 mg/dL — ABNORMAL HIGH (ref 70–99)
Potassium: 3.3 mmol/L — ABNORMAL LOW (ref 3.5–5.1)
Sodium: 134 mmol/L — ABNORMAL LOW (ref 135–145)

## 2020-03-06 LAB — C-REACTIVE PROTEIN: CRP: 1 mg/dL — ABNORMAL HIGH (ref <1.0)

## 2020-03-06 MED ORDER — SODIUM CHLORIDE 0.9 % IV SOLN: INTRAVENOUS | Status: DC

## 2020-03-06 MED ORDER — HYDROXYZINE HCL 25 MG PO TABS: 25.0000 mg | ORAL_TABLET | Freq: Four times a day (QID) | ORAL | Status: DC | PRN

## 2020-03-06 MED ORDER — POTASSIUM CHLORIDE CRYS ER 20 MEQ PO TBCR
40.0000 meq | EXTENDED_RELEASE_TABLET | Freq: Once | ORAL | Status: AC
  Administered 2020-03-06: 40 meq via ORAL
  Filled 2020-03-06: qty 2

## 2020-03-06 MED ORDER — SODIUM CHLORIDE 0.9 % IV SOLN
2.0000 g | INTRAVENOUS | Status: DC
  Administered 2020-03-06: 2 g via INTRAVENOUS
  Filled 2020-03-06 (×2): qty 20

## 2020-03-06 NOTE — Progress Notes (Signed)
CSW attempted to meet with pt regarding substance abuse evaluation and counseling.  Pt sleeping, was able to partially wake up when spoken to but not able to engage in discussion.  Will try again. Daleen Squibb, MSW, LCSW 2/14/20221:41 PM

## 2020-03-06 NOTE — Progress Notes (Addendum)
   03/06/20 1045  Clinical Encounter Type  Visited With Patient  Visit Type Spiritual support  Referral From Nurse  Consult/Referral To Chaplain  Chaplain responded to consultation. Patient appeared to be sleep and did not respond to Chaplain's greeting. This note was prepared by Deneen Harts, M.Div..  For questions please contact by phone [phone].

## 2020-03-06 NOTE — Progress Notes (Signed)
Sed rate and CRP never drawn yesterday, have changed to STAT JV

## 2020-03-06 NOTE — Progress Notes (Signed)
Progress Note    Daniel Raymond  FBX:[id number] DOB: [birth year]  DOA: 03/04/2020 PCP: Patient, No Pcp Per    Brief Narrative:     Medical records reviewed and are as summarized below:  Daniel Raymond is an 35 y.o. male with hx of substance abuse, hepatitis C who presented to the ER on 03/04/2020 secondary to significant back pain.  Patient was diagnosed with osteomyelitis of the cervical spine several months ago, appears to have been diagnosed in June 2021 at [facility].  Suspected source related to his IV drug use, uses heroin, methamphetamine, and marijuana.  Unfortunately he continues to use.  He has had multiple hospitalizations for similar presentations.  He gets started on IV antibiotics however eventually leaves AMA.  He was last hospitalized at [facility] in November 2021- -left AMA and said he did not take the abx given    Assessment/Plan:   Principal Problem:   Sepsis ([place]) Active Problems:   IV drug abuse ([place])   Tobacco dependence   Subacute osteomyelitis of cervical spine ([place])   Sepsis, possibly secondary to osteomyelitis -Initial presentation, febrile (39.3 C), tachycardic, heart rate 119-- could be withdrawal as well -Continue Vanco and Zosyn for now -MRI:No definite sign of infection in the cervical spine - ESR and CRP done 2/14 -Blood cultures obtained on admission -Echocardiogram: No distinct valvular vegetations visualized, however, there is  thickening of the mitral valve, aortic valve (appears bicuspid) and the  tricuspid valve (mild). Recommend TEE for further evaluation if clinical  suspicion for endocarditis is present.  - ID consult appreciated -will arrange for TEE in the AM   Substance abuse -Reported history of methamphetamines, heroin, marijuana -Ativan prn - can use suboxone but may hasten withdrawal -Will monitor on COWS protocol (5-12 mild symptoms; 13-24 moderate symptoms; 25-36 moderately severe symptoms; >36 severe  symptoms) -TOC team consult for substance abuse education/support -prn orders from the Clonidine withdrawal order set were also ordered.  Tobacco abuse -Recommend cessation -nicotine patch ordered if patient allows     Family Communication/Anticipated D/C date and plan/Code Status   DVT prophylaxis: Lovenox ordered. Code Status: Full Code.  Disposition Plan: Status is: Inpatient  Remains inpatient appropriate because:Inpatient level of care appropriate due to severity of illness   Dispo: The patient is from: Home              Anticipated d/c is to: Home              Anticipated d/c date is: > 3 days              Patient currently is not medically stable to d/c.   Difficult to place patient Yes    Medical Consultants:    ID  Cards (TEE only)     Subjective:   No complaints  Objective:    Vitals:   03/05/20 1524 03/05/20 2102 03/06/20 0546 03/06/20 0840  BP: 139/87 117/87 111/82 125/81  Pulse: 75 72 64 73  Resp: (!) 22 20 20 20   Temp: 98.5 F (36.9 C) 98.4 F (36.9 C) 98.2 F (36.8 C) (!) 97.5 F (36.4 C)  TempSrc: Axillary Axillary Axillary Oral  SpO2: 100% 100% 100% 100%  Weight:        Intake/Output Summary (Last 24 hours) at 03/06/2020 1236 Last data filed at 03/06/2020 1051 Gross per 24 hour  Intake 658.72 ml  Output 2720 ml  Net -2061.28 ml   Filed Weights   03/04/20 2018  Weight: 72.6 kg    Exam: In bed, NAD Poor eye contact rrr No increased work of breathing   Data Reviewed:   I have personally reviewed following labs and imaging studies:  Labs: Labs show the following:   Basic Metabolic Panel: Recent Labs  Lab 03/04/20 1823 03/05/20 0505 03/06/20 0137  NA 134* 137 134*  K 3.9 3.5 3.3*  CL 99 103 101  CO2 26 26 22   GLUCOSE 132* 116* 125*  BUN 16 12 16   CREATININE 0.91 0.91 0.86  CALCIUM 9.4 8.6* 8.7*  MG  --  1.7  --   PHOS  --  2.8  --    GFR Estimated Creatinine Clearance: 124.3 mL/min (by C-G formula  based on SCr of 0.86 mg/dL). Liver Function Tests: Recent Labs  Lab 03/04/20 1823 03/05/20 0505  AST 45* 39  ALT 38 30  ALKPHOS 98 83  BILITOT 0.6 1.0  PROT 7.0 6.0*  ALBUMIN 3.9 2.9*   No results for input(s): LIPASE, AMYLASE in the last 168 hours. No results for input(s): AMMONIA in the last 168 hours. Coagulation profile Recent Labs  Lab 03/04/20 1903  INR 1.0    CBC: Recent Labs  Lab 03/04/20 1823 03/06/20 0137  WBC 9.7 4.4  NEUTROABS 8.1*  --   HGB 9.2* 9.0*  HCT 29.7* 27.8*  MCV 86.1 84.0  PLT 272 214   Cardiac Enzymes: No results for input(s): CKTOTAL, CKMB, CKMBINDEX, TROPONINI in the last 168 hours. BNP (last 3 results) No results for input(s): PROBNP in the last 8760 hours. CBG: No results for input(s): GLUCAP in the last 168 hours. D-Dimer: No results for input(s): DDIMER in the last 72 hours. Hgb A1c: No results for input(s): HGBA1C in the last 72 hours. Lipid Profile: No results for input(s): CHOL, HDL, LDLCALC, TRIG, CHOLHDL, LDLDIRECT in the last 72 hours. Thyroid function studies: Recent Labs    03/04/20 1903  TSH 0.713   Anemia work up: No results for input(s): VITAMINB12, FOLATE, FERRITIN, TIBC, IRON, RETICCTPCT in the last 72 hours. Sepsis Labs: Recent Labs  Lab 03/04/20 1822 03/04/20 1823 03/04/20 1903 03/06/20 0137  WBC  --  9.7  --  4.4  LATICACIDVEN 1.2  --  1.0  --     Microbiology Recent Results (from the past 240 hour(s))  Blood Culture (routine x 2)     Status: None (Preliminary result)   Collection Time: 03/04/20  7:03 PM   Specimen: BLOOD  Result Value Ref Range Status   Specimen Description BLOOD SITE NOT SPECIFIED  Final   Special Requests   Final    BOTTLES DRAWN AEROBIC AND ANAEROBIC Blood Culture adequate volume   Culture   Final    NO GROWTH < 24 HOURS Performed at [facility], [place] [address], [place], [place] [address]    Report Status PENDING  Incomplete  Blood Culture (routine x 2)     Status:  None (Preliminary result)   Collection Time: 03/04/20  7:03 PM   Specimen: BLOOD  Result Value Ref Range Status   Specimen Description BLOOD SITE NOT SPECIFIED  Final   Special Requests   Final    BOTTLES DRAWN AEROBIC AND ANAEROBIC Blood Culture adequate volume   Culture   Final    NO GROWTH < 24 HOURS Performed at [facility], [place] [address] [place], [place] [address]    Report Status PENDING  Incomplete  Resp Panel by RT-PCR (Flu A&B, Covid) Nasopharyngeal  Swab     Status: None   Collection Time: 03/05/20  1:15 AM   Specimen: Nasopharyngeal Swab; Nasopharyngeal(NP) swabs in vial transport medium  Result Value Ref Range Status   SARS Coronavirus 2 by RT PCR NEGATIVE NEGATIVE Final    Comment: (NOTE) SARS-CoV-2 target nucleic acids are NOT DETECTED.  The SARS-CoV-2 RNA is generally detectable in upper respiratory specimens during the acute phase of infection. The lowest concentration of SARS-CoV-2 viral copies this assay can detect is 138 copies/mL. A negative result does not preclude SARS-Cov-2 infection and should not be used as the sole basis for treatment or other patient management decisions. A negative result may occur with  improper specimen collection/handling, submission of specimen other than nasopharyngeal swab, presence of viral mutation(s) within the areas targeted by this assay, and inadequate number of viral copies(<138 copies/mL). A negative result must be combined with clinical observations, patient history, and epidemiological information. The expected result is Negative.  Fact Sheet for Patients:  EntrepreneurPulse.com.au  Fact Sheet for Healthcare Providers:  IncredibleEmployment.be  This test is no t yet approved or cleared by the [place] FDA and  has been authorized for detection and/or diagnosis of SARS-CoV-2 by FDA under an Emergency Use Authorization (EUA). This EUA will remain  in effect (meaning  this test can be used) for the duration of the COVID-19 declaration under Section 564(b)(1) of the Act, 21 U.S.C.section 360bbb-3(b)(1), unless the authorization is terminated  or revoked sooner.       Influenza A by PCR NEGATIVE NEGATIVE Final   Influenza B by PCR NEGATIVE NEGATIVE Final    Comment: (NOTE) The Xpert Xpress SARS-CoV-2/FLU/RSV plus assay is intended as an aid in the diagnosis of influenza from Nasopharyngeal swab specimens and should not be used as a sole basis for treatment. Nasal washings and aspirates are unacceptable for Xpert Xpress SARS-CoV-2/FLU/RSV testing.  Fact Sheet for Patients: EntrepreneurPulse.com.au  Fact Sheet for Healthcare Providers: IncredibleEmployment.be  This test is not yet approved or cleared by the [place] FDA and has been authorized for detection and/or diagnosis of SARS-CoV-2 by FDA under an Emergency Use Authorization (EUA). This EUA will remain in effect (meaning this test can be used) for the duration of the COVID-19 declaration under Section 564(b)(1) of the Act, 21 U.S.C. section 360bbb-3(b)(1), unless the authorization is terminated or revoked.  Performed at [facility], [place] [address] [place], [place] [address]   Urine culture     Status: None   Collection Time: 03/05/20  1:18 AM   Specimen: In/Out Cath Urine  Result Value Ref Range Status   Specimen Description IN/OUT CATH URINE  Final   Special Requests NONE  Final   Culture   Final    NO GROWTH Performed at [facility], [place] [address], [place], [place] [address]    Report Status 03/06/2020 FINAL  Final    Procedures and diagnostic studies:  MR Cervical Spine W or Wo Contrast  Result Date: 03/04/2020 CLINICAL DATA:  Acute neck pain with suspicion of infection. EXAM: MRI CERVICAL SPINE WITHOUT AND WITH CONTRAST TECHNIQUE: Multiplanar and multiecho pulse sequences of the cervical spine, to include the  craniocervical junction and cervicothoracic junction, were obtained without and with intravenous contrast. CONTRAST:  7.52m GADAVIST GADOBUTROL 1 MMOL/ML IV SOLN COMPARISON:  12/03/2019.  11/18/2019.  05/07/2019. FINDINGS: Alignment: Chronic kyphotic curvature with the apex at C5-6. Vertebrae: See below for descriptions at each level. Cord: No cord compression or primary cord lesion.  Effacement of the ventral subarachnoid space at the C5-6 level with slight indentation of the ventral cord. AP diameter of the canal in the midline 8 mm. Posterior Fossa, vertebral arteries, paraspinal tissues: Negative Disc levels: Foramen magnum sufficiently patent. Ordinary osteoarthritis at the C1-2 articulation. C2-3: Chronic facet arthropathy on the left. Endplate osteophytes and mild bulging of the disc. Mild facet arthritis on the right. Mild edema and enhancement. The findings are nonspecific and could be due to degenerative arthropathy or chronic sequela of septic facet arthritis. Left foraminal narrowing. C3-4: Endplate osteophytes and bulging of the disc. Facet osteoarthritis on the left. Left foraminal narrowing. C4-5: No disc space pathology. Facet osteoarthritis on the left. Mild left foraminal narrowing. C5-6: Chronic loss of disc height with chronic bone fusion. Kyphotic deformity. Vertebral body endplate encroachment upon the canal with AP diameter of 8 mm, effacing the subarachnoid space and indenting the ventral cord slightly. Mild bilateral foraminal narrowing, not grossly compressive. C6-7: Chronic disc degeneration. Endplate changes of sclerosis and mild enhancement. Bilateral foraminal narrowing due to osteophytic encroachment. No compressive central canal stenosis. Findings at this level are favored to be degenerative. Cannot rule out the possibility of resolved infection but that is not favored. C7-T1: Endplate osteophytes and mild bulging of the disc. No compressive stenosis. IMPRESSION: 1. No definite sign of  infection in the cervical spine. 2. 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 Canal narrowing with AP diameter of 8 mm and slight indentation of the ventral cord. No abnormal cord signal at this level. Mild bilateral foraminal narrowing, not grossly compressive. 3. C2-3: Chronic facet arthropathy on the left. Mild edema and enhancement. This could be due to degenerative arthropathy or chronic sequela of septic facet arthritis. Mild foraminal narrowing on the left. 4. C3-4: Left foraminal narrowing because of osteophytic encroachment from facet osteoarthritis. 5. C4-5: Left foraminal narrowing because of osteophytic encroachment from facet arthritis. 6. C6-7: Chronic disc degeneration. Endplate changes of sclerosis and mild enhancement. No compressive central canal stenosis. Bilateral foraminal narrowing due to osteophytic encroachment. Findings at this level are favored to be degenerative. Cannot rule out the possibility of resolved infection but that is not favored. Electronically Signed   By: Nelson Chimes M.D.   On: 03/04/2020 22:28   MR THORACIC SPINE W WO CONTRAST  Result Date: 03/04/2020 CLINICAL DATA:  Back pain.  Question infection. EXAM: MRI THORACIC WITHOUT AND WITH CONTRAST TECHNIQUE: Multiplanar and multiecho pulse sequences of the thoracic spine were obtained without and with intravenous contrast. CONTRAST:  7.27m GADAVIST GADOBUTROL 1 MMOL/ML IV SOLN COMPARISON:  CT 0May 12, 2021FINDINGS: Alignment:  Mild curvature convex to the right. Vertebrae: Endplate edema and enhancement at the T9-10 level, worse on the left. Right side is preserved. Findings are favored to be degenerative. Degenerative changes were seen on the CT of 0May 12, 2021 Lack 0 pronounced progression and lack of disc space T2 signal or enhancement argue against infection. No paravertebral collection. Cord:  No cord compression or primary cord lesion. Paraspinal and other soft tissues:  Negative. No paravertebral inflammatory findings. Disc levels: T1-2: Normal T2-3 through T8-9: Disc bulges. No compressive stenosis of the canal or foramina. T9-10: Endplate osteophytes and shallow protrusion of the disc more prominent on the left. Narrowing of the ventral subarachnoid space but no compression of the cord. Moderate foraminal narrowing on the left could possibly affect the left T9 nerve. See above discussion, favoring degenerative change over infection. T10-11, T11-12 and T12-L1: Noncompressive disc bulges.  IMPRESSION: Degenerative endplate edema and enhancement at the T9-10 level, worse on the left. Endplate osteophytes and shallow protrusion of the disc more prominent on the left. Moderate foraminal narrowing on the left could possibly affect the left T9 nerve. Degenerative change at this level is similar but slightly more pronounced than was seen on the CT of 05/07/2019. I do not think the findings at this level are likely due to spinal infection. Of course, very early infection could not be excluded, and follow-up may be warranted. Electronically Signed   By: Nelson Chimes M.D.   On: 03/04/2020 22:34   DG Chest Port 1 View  Result Date: 03/04/2020 CLINICAL DATA:  Questionable sepsis - evaluate for abnormality Patient reports hospitalization months ago for spinal infection/osteomyelitis and left AMA. Severe upper back pain. EXAM: PORTABLE CHEST 1 VIEW COMPARISON:  Radiograph 07/31/2017 FINDINGS: Low lung volumes. Prominent heart size likely accentuated by low lung volumes. Vague opacity in the right mid lung as well as questionable right perihilar opacity, partially obscured by overlying EKG leads. No pneumothorax or pleural effusion. No pulmonary edema. No acute osseous abnormalities are seen on this portable AP view. IMPRESSION: 1. Low lung volumes with vague opacity in the right mid lung and questionable right perihilar opacity. Findings may represent pneumonia in the appropriate clinical  setting. 2. Prominent heart size likely accentuated by low lung volumes. Consider PA and lateral views when patient is able. Electronically Signed   By: Keith Rake M.D.   On: 03/04/2020 19:13   ECHOCARDIOGRAM COMPLETE  Result Date: 03/05/2020    ECHOCARDIOGRAM REPORT   Patient Name:   Daniel Raymond Date of Exam: 03/05/2020 Medical Rec #:  [id number]   Height:       70.0 in Accession #:    [id number]  Weight:       160.1 lb Date of Birth:  [birth year]   BSA:          1.899 m Patient Age:    34 years    BP:           133/89 mmHg Patient Gender: M           HR:           68 bpm. Exam Location:  Inpatient Procedure: 2D Echo Indications:    bacteremia  History:        Patient has no prior history of Echocardiogram examinations.                 Sepsis; Risk Factors:Current Smoker and IV drug use.  Sonographer:    Johny Chess Referring Phys: [id number] Orlean Bradford MACNEIL  Sonographer Comments: Image acquisition challenging due to uncooperative patient. IMPRESSIONS  1. No distinct valvular vegetations visualized, however, there is thickening of the mitral valve, aortic valve (appears bicuspid) and the tricuspid valve (mild). Recommend TEE for further evaluation if clinical suspicion for endocarditis is present.  2. Left ventricular ejection fraction, by estimation, is 60 to 65%. The left ventricle has normal function. The left ventricle has no regional wall motion abnormalities. Left ventricular diastolic parameters were normal.  3. Right ventricular systolic function is normal. The right ventricular size is normal.  4. The mitral valve is mildly thickened. Trivial mitral valve regurgitation.  5. TV appears mildly thickened. Mild tricuspid regurgitation.  6. The aortic valve appears bicuspid. There is moderate thickening of the aortic valve. Aortic valve regurgitation is mild.  7. Aortic dilatation noted. There is borderline dilatation of the ascending aorta, measuring 36  mm.  8. The inferior vena cava is normal in  size with greater than 50% respiratory variability, suggesting right atrial pressure of 3 mmHg. Comparison(s): No prior Echocardiogram. Conclusion(s)/Recommendation(s): No evidence of valvular vegetations on this transthoracic echocardiogram. Would recommend a transesophageal echocardiogram to exclude infective endocarditis if clinically indicated. FINDINGS  Left Ventricle: Left ventricular ejection fraction, by estimation, is 60 to 65%. The left ventricle has normal function. The left ventricle has no regional wall motion abnormalities. The left ventricular internal cavity size was normal in size. There is  no left ventricular hypertrophy. Left ventricular diastolic parameters were normal. Right Ventricle: The right ventricular size is normal. No increase in right ventricular wall thickness. Right ventricular systolic function is normal. Left Atrium: Left atrial size was normal in size. Right Atrium: Right atrial size was normal in size. Pericardium: There is no evidence of pericardial effusion. Mitral Valve: The mitral valve is grossly normal. There is mild thickening of the mitral valve leaflet(s). Trivial mitral valve regurgitation. Tricuspid Valve: TV appears mildly thickened. The tricuspid valve is normal in structure. Tricuspid valve regurgitation is mild. Aortic Valve: The aortic valve is bicuspid. There is moderate thickening of the aortic valve. Aortic valve regurgitation is mild. Aortic regurgitation PHT measures 335 msec. Pulmonic Valve: The pulmonic valve was normal in structure. Pulmonic valve regurgitation is trivial. Aorta: Aortic dilatation noted. There is borderline dilatation of the ascending aorta, measuring 36 mm. Venous: The inferior vena cava is normal in size with greater than 50% respiratory variability, suggesting right atrial pressure of 3 mmHg. IAS/Shunts: No atrial level shunt detected by color flow Doppler.  LEFT VENTRICLE PLAX 2D LVIDd:         4.60 cm  Diastology LVIDs:         3.00  cm  LV e' medial:    12.40 cm/s LV PW:         1.00 cm  LV E/e' medial:  9.8 LV IVS:        0.90 cm  LV e' lateral:   16.10 cm/s LVOT diam:     2.40 cm  LV E/e' lateral: 7.5 LV SV:         121 LV SV Index:   64 LVOT Area:     4.52 cm  RIGHT VENTRICLE             IVC RV S prime:     17.20 cm/s  IVC diam: 1.80 cm TAPSE (M-mode): 3.8 cm LEFT ATRIUM             Index       RIGHT ATRIUM           Index LA diam:        3.40 cm 1.79 cm/m  RA Area:     14.40 cm LA Vol (A2C):   62.7 ml 33.02 ml/m RA Volume:   35.60 ml  18.75 ml/m LA Vol (A4C):   37.3 ml 19.64 ml/m LA Biplane Vol: 48.6 ml 25.60 ml/m  AORTIC VALVE LVOT Vmax:   134.00 cm/s LVOT Vmean:  88.600 cm/s LVOT VTI:    0.267 m AI PHT:      335 msec  AORTA Ao Root diam: 3.60 cm Ao Asc diam:  3.60 cm MITRAL VALVE MV Area (PHT): 4.06 cm     SHUNTS MV Decel Time: 187 msec     Systemic VTI:  0.27 m MV E velocity: 121.00 cm/s  Systemic Diam: 2.40 cm MV A velocity: 76.60 cm/s MV E/A  ratio:  1.58 Gwyndolyn Kaufman MD Electronically signed by Gwyndolyn Kaufman MD Signature Date/Time: 03/05/2020/12:09:56 PM    Final     Medications:   . buprenorphine-naloxone  1 tablet Sublingual BID  . cloNIDine  0.1 mg Oral QID   Followed by  . [START ON 03/07/2020] cloNIDine  0.1 mg Oral BH-qamhs   Followed by  . [START ON 03/10/2020] cloNIDine  0.1 mg Oral QAC breakfast  . enoxaparin (LOVENOX) injection  40 mg Subcutaneous Q24H  . folic acid  1 mg Oral Daily  . nicotine  14 mg Transdermal Daily  . thiamine  100 mg Oral Daily   Continuous Infusions: . piperacillin-tazobactam (ZOSYN)  IV 3.375 g (03/06/20 0545)  . vancomycin 1,250 mg (03/06/20 1217)     LOS: 2 days   Geradine Girt  [facility]   How to contact the [facility] Attending or Consulting provider [place] or covering provider during after hours [place], for this patient?  1. Check the care team in [facility] and look for a) attending/consulting TR[facility] provider listed and b) the [facility] team listed 2. Log into  [email or web address] and use 's universal password to access. If you do not have the password, please contact the hospital operator. 3. Locate the [facility] provider you are looking for under [facility] and page to a number that you can be directly reached. 4. If you still have difficulty reaching the provider, please page the [facility] (Director on Call) for the Hospitalists listed on amion for assistance.  03/06/2020, 12:36 PM

## 2020-03-06 NOTE — Consult Note (Addendum)
[place] [facility]                                                                                        Patient Identification: Patient Name: Daniel Raymond MRN: [id number] [place] Date: 03/04/2020  5:27 PM Today's Date: 03/06/2020 Reason for consult: Concern for Endocarditis  Requesting provider: Rudean Curt   Principal Problem:   Sepsis [facility] [place]) Active Problems:   IV drug abuse ([place])   Tobacco dependence   Subacute osteomyelitis of cervical spine ([place])   Antibiotics: Vancomycin 2/12- c                    Zosyn 2/12- c  Lines/Tubes: PIVs   Assessment # H/o Cervical facet septic arthritis/osteomyelitis with worsening posterior neck pain  CT c spine W contrast at [facility] with concerns of ongoing septic facet joint MRI c spine here with concerns of chronic sequelae of septic facet arthritis and no definitive sign of infection in the c spine   # Active IVDU # Hepatitis C - to be treated as OP  # Fevers - only one occurrence, normal WBC count and otherwise stable.  Recommendations  -I think TEE would be helpful in this patient given active IVDU with concerns of endocarditis, TTE was difficult and sub-optimal with thickening in the MV and AV, presence of murmur and Chest xray with opacity in the RML and Rt perihilar region ? Septic emboli.   -Of note, he has not had positive blood cultures so far since October when he was treated for cervical spine infection  -Less likely to be a PNA. Would de-escalate zosyn to ceftriaxone and continue vancomycin, pharmacy to dose or switch to daptomycin for safety -Follow up blood cultures  -Monitor CBC, CMP, vancomycin trough  -ESR and CRP ( ordered )  -Check Urine GC and RPR   Rest of the management as per the primary team. Please call with questions or concerns.  Thank you for the  consult __________________________________________________________________________________________________________ HPI and Hospital Course: Daniel Raymond is a 35 Y O Male with h/o active substance abuse and hepatitis C who presented to the ED on 2/12 with complaints of significant posterior neck pain.   Patient was admitted back at [facility] in June 2021 with initial concerns for infection in CR.   Follow up in September at [facility] with C2-3 facet septic joint with bony erosions, osteo and myositis. Admitted at [facility] in October for facet septic arthritis/discitis and osteomyelitis. No surgical intervention done. No positive blood cultures. He was treated empirically with Vancomycin and ceftriaxone until 12/12/20 when he left AMA and was given Linezolid and levofloxacin on discharge to continue until end of 6 weeks 11/28. Patient says he took the antibiotics as prescribed after discharge from the hospital.   He then presented to [facility] on 1/8-1/12 with significant posterior back pain and left AMA. CT cervical spine there with concerns of ongoing septic facet joint. Blood  cx 01/29/20 NG in 5 days. He then presented to [facility] with similar complaints.   Says last IV drug was heroin and that was injected 5 days ago.  He says posterior neck pain has worsening in the last 1-2 weeks. This is a/w fevers, chills and sweats. Cough is minimal, SOB is minimal. Denies any chest pain. Had some nausea. NBNB vomiting and loose stool in the beginning but not anymore. Denies GU complaints. Denies any rashes or pain and swelling in the other joints     ROS: General- Denies loss of appetite and loss of weight HEENT - Denies headache, blurry vision,  sinus pain Chest - Denies any chest pain, SOB or cough CVS- Denies any dizziness/lightheadedness, syncopal attacks, palpitations Abdomen- Denies any nausea, vomiting, abdominal pain, hematochezia and diarrhea Neuro - Denies any weakness, numbness, tingling  sensation Psych - Denies any changes in mood irritability or depressive symptoms GU- Denies any burning, dysuria, hematuria or increased frequency of urination Skin - denies any rashes/lesions MSK - denies any joint pain/swelling, restricted ROM of neck ( chronic)  Past Medical History:  Diagnosis Date  . Hepatitis C   . IV drug abuse ([place])   . Polysubstance dependence including opioid type drug with complication, episodic abuse, with unspecified complication ([place])   . Subacute osteomyelitis of cervical spine ([facility])   . Tobacco dependence    Past Surgical History:  Procedure Laterality Date  . ANKLE SURGERY       Scheduled Meds: . buprenorphine-naloxone  1 tablet Sublingual BID  . cloNIDine  0.1 mg Oral QID   Followed by  . [START ON 03/07/2020] cloNIDine  0.1 mg Oral BH-qamhs   Followed by  . [START ON 03/10/2020] cloNIDine  0.1 mg Oral QAC breakfast  . enoxaparin (LOVENOX) injection  40 mg Subcutaneous Q24H  . folic acid  1 mg Oral Daily  . nicotine  14 mg Transdermal Daily  . thiamine  100 mg Oral Daily   Continuous Infusions: . piperacillin-tazobactam (ZOSYN)  IV 3.375 g (03/06/20 0545)  . vancomycin Stopped (03/06/20 0700)   PRN Meds:.acetaminophen **OR** acetaminophen, albuterol, buprenorphine-naloxone, dicyclomine, haloperidol lactate, hydrOXYzine, loperamide, LORazepam, methocarbamol, ondansetron **OR** ondansetron (ZOFRAN) IV, polyethylene glycol, temazepam  Allergies  Allergen Reactions  . Ibuprofen Nausea And Vomiting, Nausea Only and Other (See Comments)    Stomach pains   Social History   Socioeconomic History  . Marital status: Unknown    Spouse name: Not on file  . Number of children: Not on file  . Years of education: Not on file  . Highest education level: Not on file  Occupational History  . Occupation: unemployed  Tobacco Use  . Smoking status: Current Every Day Smoker    Packs/day: 1.00    Years: 21.00    Pack years: 21.00    Types:  Cigarettes  . Smokeless tobacco: Never Used  Vaping Use  . Vaping Use: Never used  Substance and Sexual Activity  . Alcohol use: Not Currently  . Drug use: Yes    Types: Methamphetamines, Heroin, Marijuana  . Sexual activity: Not on file  Other Topics Concern  . Not on file  Social History Narrative  . Not on file   Social Determinants of Health   Financial Resource Strain: Not on file  Food Insecurity: Not on file  Transportation Needs: Not on file  Physical Activity: Not on file  Stress: Not on file  Social Connections: Not on file  Intimate Partner Violence: Not on file    Vitals  BP 125/81 (BP Location: Right Arm)   Pulse 73   Temp (!) 97.5 F (36.4 C) (Oral) Comment: I concur with student findings  Resp  20   Wt 72.6 kg   SpO2 100%   BMI 22.97 kg/m    Physical Exam Constitutional:  eating breakfast, appears to be comfortable     Comments:   Cardiovascular:     Rate and Rhythm: Normal rate and regular rhythm.     Heart sounds: Systolic murmur+  Pulmonary:     Effort: Pulmonary effort is normal.     Comments: clear air fields   Abdominal:     Palpations: Abdomen is soft.     Tenderness: Non tender  Musculoskeletal:        General: Posterior cervical c spine tenderness+  Skin:    Comments: no obvious rashes, lesions  Neurological:     General: No focal deficit present.   Psychiatric:        Mood and Affect: Mood normal.    Pertinent Microbiology Results for orders placed or performed during the hospital encounter of 03/04/20  Blood Culture (routine x 2)     Status: None (Preliminary result)   Collection Time: 03/04/20  7:03 PM   Specimen: BLOOD  Result Value Ref Range Status   Specimen Description BLOOD SITE NOT SPECIFIED  Final   Special Requests   Final    BOTTLES DRAWN AEROBIC AND ANAEROBIC Blood Culture adequate volume   Culture   Final    NO GROWTH < 24 HOURS Performed at [facility], [place] [address], [place], [place]  [address]    Report Status PENDING  Incomplete  Blood Culture (routine x 2)     Status: None (Preliminary result)   Collection Time: 03/04/20  7:03 PM   Specimen: BLOOD  Result Value Ref Range Status   Specimen Description BLOOD SITE NOT SPECIFIED  Final   Special Requests   Final    BOTTLES DRAWN AEROBIC AND ANAEROBIC Blood Culture adequate volume   Culture   Final    NO GROWTH < 24 HOURS Performed at [facility], [place] [address] [place], [place] [address]    Report Status PENDING  Incomplete  Resp Panel by RT-PCR (Flu A&B, Covid) Nasopharyngeal Swab     Status: None   Collection Time: 03/05/20  1:15 AM   Specimen: Nasopharyngeal Swab; Nasopharyngeal(NP) swabs in vial transport medium  Result Value Ref Range Status   SARS Coronavirus 2 by RT PCR NEGATIVE NEGATIVE Final    Comment: (NOTE) SARS-CoV-2 target nucleic acids are NOT DETECTED.  The SARS-CoV-2 RNA is generally detectable in upper respiratory specimens during the acute phase of infection. The lowest concentration of SARS-CoV-2 viral copies this assay can detect is 138 copies/mL. A negative result does not preclude SARS-Cov-2 infection and should not be used as the sole basis for treatment or other patient management decisions. A negative result may occur with  improper specimen collection/handling, submission of specimen other than nasopharyngeal swab, presence of viral mutation(s) within the areas targeted by this assay, and inadequate number of viral copies(<138 copies/mL). A negative result must be combined with clinical observations, patient history, and epidemiological information. The expected result is Negative.  Fact Sheet for Patients:  EntrepreneurPulse.com.au  Fact Sheet for Healthcare Providers:  IncredibleEmployment.be  This test is no t yet approved or cleared by the [place] FDA and  has been authorized for detection and/or diagnosis of SARS-CoV-2 by FDA  under an Emergency Use Authorization (EUA). This EUA will remain  in effect (meaning this test can be used) for the duration of the COVID-19 declaration under Section 564(b)(1)  of the Act, 21 U.S.C.section 360bbb-3(b)(1), unless the authorization is terminated  or revoked sooner.       Influenza A by PCR NEGATIVE NEGATIVE Final   Influenza B by PCR NEGATIVE NEGATIVE Final    Comment: (NOTE) The Xpert Xpress SARS-CoV-2/FLU/RSV plus assay is intended as an aid in the diagnosis of influenza from Nasopharyngeal swab specimens and should not be used as a sole basis for treatment. Nasal washings and aspirates are unacceptable for Xpert Xpress SARS-CoV-2/FLU/RSV testing.  Fact Sheet for Patients: EntrepreneurPulse.com.au  Fact Sheet for Healthcare Providers: IncredibleEmployment.be  This test is not yet approved or cleared by the [place] FDA and has been authorized for detection and/or diagnosis of SARS-CoV-2 by FDA under an Emergency Use Authorization (EUA). This EUA will remain in effect (meaning this test can be used) for the duration of the COVID-19 declaration under Section 564(b)(1) of the Act, 21 U.S.C. section 360bbb-3(b)(1), unless the authorization is terminated or revoked.  Performed at [facility], [place] [address] [place], [place] [address]   Urine culture     Status: None   Collection Time: 03/05/20  1:18 AM   Specimen: In/Out Cath Urine  Result Value Ref Range Status   Specimen Description IN/OUT CATH URINE  Final   Special Requests NONE  Final   Culture   Final    NO GROWTH Performed at [facility], [place] [address], [place], [place] [address]    Report Status 03/06/2020 FINAL  Final     Pertinent Lab seen by me: CBC Latest Ref Rng & Units 03/06/2020 03/04/2020 12/13/2019  WBC 4.0 - 10.5 K/uL 4.4 9.7 4.0  Hemoglobin 13.0 - 17.0 g/dL 9.0(L) 9.2(L) 10.9(L)  Hematocrit 39.0 - 52.0 % 27.8(L) 29.7(L) 32.6(L)   Platelets 150 - 400 K/uL 214 272 190   CMP Latest Ref Rng & Units 03/06/2020 03/05/2020 03/04/2020  Glucose 70 - 99 mg/dL 125(H) 116(H) 132(H)  BUN 6 - 20 mg/dL _0 Creatinine 0.61 - 1.24 mg/dL 0.86 0.91 0.91  Sodium 135 - 145 mmol/L 134(L) 137 134(L)  Potassium 3.5 - 5.1 mmol/L 3.3(L) 3.5 3.9  Chloride 98 - 111 mmol/L 101 103 99  CO2 22 - 32 mmol/L _1 Calcium 8.9 - 10.3 mg/dL 8.7(L) 8.6(L) 9.4  Total Protein 6.5 - 8.1 g/dL - 6.0(L) 7.0  Total Bilirubin 0.3 - 1.2 mg/dL - 1.0 0.6  Alkaline Phos 38 - 126 U/L - 83 98  AST 15 - 41 U/L - 39 45(H)  ALT 0 - 44 U/L - 30 38    Pertinent Imagings/Other Imagings Plain films and CT images have been personally visualized and interpreted; radiology reports have been reviewed. Decision making incorporated into the Impression / Recommendations.  CT cervical spine obtained on 01/29/2020 at outside facility: CONCLUSION:   1. In comparison to CT dated 10/25/2019, there is increased conspicuity of severe degenerative/erosive changes of the left C2/C3 facet joint concerning for ongoing septic facet joint.  2. Similar sequela of prior of the C5-6 intervertebral space including advanced degenerative changes, C5 vertebral body height loss, and focal kyphosis.   MR thoracic spine wwo contrast 03/04/20 IMPRESSION: Degenerative endplate edema and enhancement at the T9-10 level, worse on the left. Endplate osteophytes and shallow protrusion of the disc more prominent on the left. Moderate foraminal narrowing on the left could possibly affect the left T9 nerve. Degenerative change at this level is similar but slightly more pronounced than was seen on  the CT of 05/07/2019. I do not think the findings at this level are likely due to spinal infection. Of course, very early infection could not be excluded, and follow-up may be warranted.   MR cervical spine 03/04/20 IMPRESSION: 1. No definite sign of infection in the cervical spine. 2. 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 Canal narrowing with AP diameter of 8 mm and slight indentation of the ventral cord. No abnormal cord signal at this level. Mild bilateral foraminal narrowing, not grossly compressive. 3. C2-3: Chronic facet arthropathy on the left. Mild edema and enhancement. This could be due to degenerative arthropathy or chronic sequela of septic facet arthritis. Mild foraminal narrowing on the left. 4. C3-4: Left foraminal narrowing because of osteophytic encroachment from facet osteoarthritis. 5. C4-5: Left foraminal narrowing because of osteophytic encroachment from facet arthritis. 6. C6-7: Chronic disc degeneration. Endplate changes of sclerosis and mild enhancement. No compressive central canal stenosis. Bilateral foraminal narrowing due to osteophytic encroachment. Findings at this level are favored to be degenerative. Cannot rule out the possibility of resolved infection but that is not favored.  TTE 03/05/20 1. No distinct valvular vegetations visualized, however, there is thickening of the mitral valve, aortic valve (appears bicuspid) and the tricuspid valve (mild). Recommend TEE for further evaluation if clinical suspicion for endocarditis is present. 2. Left ventricular ejection fraction, by estimation, is 60 to 65%. The left ventricle has normal function. The left ventricle has no regional wall motion abnormalities. Left ventricular diastolic parameters were normal. 3. Right ventricular systolic function is normal. The right ventricular size is normal. 4. The mitral valve is mildly thickened. Trivial mitral valve regurgitation. 5. TV appears mildly thickened. Mild tricuspid regurgitation. 6. The aortic valve appears bicuspid. There is moderate thickening of the aortic valve. Aortic valve regurgitation is mild. 7. Aortic dilatation noted. There is borderline dilatation of the ascending aorta, measuring  36 mm. 8. The inferior vena cava is normal in size with greater than 50% respiratory variability, suggesting right atrial pressure of 3 mmHg. Comparison(s): No prior Echocardiogram. Conclusion(s)/Recommendation(s): No evidence of valvular vegetations on this transthoracic echocardiogram. Would recommend a transesophageal echocardiogram to exclude infective endocarditis if clinically indicated.   I have spent 60 minutes for this patient encounter including review of prior medical records with greater than 50% of time being face to face and coordination of their care.  Electronically signed by:   [name], MD Infectious Disease Physician [facility] Pager: [phone]

## 2020-03-06 NOTE — Progress Notes (Signed)
    [facility] has been requested to perform a transesophageal echocardiogram on 03/07/20 for Bacteremia.  After careful review of history and examination, the risks and benefits of transesophageal echocardiogram have been explained including risks of esophageal damage, perforation (1:10,000 risk), bleeding, pharyngeal hematoma as well as other potential complications associated with conscious sedation including aspiration, arrhythmia, respiratory failure and death. Alternatives to treatment were discussed, questions were answered. Patient is willing to proceed.   Of note, K+ 3.3 today. Will need to recheck BMET tomorrow  Laverda Page, NP-C 03/06/2020 4:22 PM

## 2020-03-07 ENCOUNTER — Inpatient Hospital Stay (HOSPITAL_COMMUNITY): Payer: Self-pay

## 2020-03-07 ENCOUNTER — Encounter (HOSPITAL_COMMUNITY)
Admission: EM | Discharge status: Against Medical Advice | Payer: Self-pay | Source: Home / Self Care | Attending: Internal Medicine

## 2020-03-07 ENCOUNTER — Inpatient Hospital Stay (HOSPITAL_COMMUNITY): Payer: Self-pay | Admitting: Certified Registered"

## 2020-03-07 ENCOUNTER — Encounter (HOSPITAL_COMMUNITY): Payer: Self-pay | Admitting: Internal Medicine

## 2020-03-07 DIAGNOSIS — I340 Nonrheumatic mitral (valve) insufficiency: Secondary | ICD-10-CM

## 2020-03-07 DIAGNOSIS — R7881 Bacteremia: Secondary | ICD-10-CM

## 2020-03-07 HISTORY — PX: TEE WITHOUT CARDIOVERSION: SHX5443

## 2020-03-07 LAB — BASIC METABOLIC PANEL
Anion gap: 7 (ref 5–15)
BUN: 14 mg/dL (ref 6–20)
CO2: 23 mmol/L (ref 22–32)
Calcium: 8.6 mg/dL — ABNORMAL LOW (ref 8.9–10.3)
Chloride: 108 mmol/L (ref 98–111)
Creatinine, Ser: 0.76 mg/dL (ref 0.61–1.24)
GFR, Estimated: >60 mL/min (ref >60)
Glucose, Bld: 128 mg/dL — ABNORMAL HIGH (ref 70–99)
Potassium: 3.6 mmol/L (ref 3.5–5.1)
Sodium: 138 mmol/L (ref 135–145)

## 2020-03-07 LAB — GC/CHLAMYDIA PROBE AMP (~~LOC~~) NOT AT ARMC
Chlamydia: NEGATIVE
Comment: NEGATIVE
Comment: NORMAL
Neisseria Gonorrhea: NEGATIVE

## 2020-03-07 LAB — RPR: RPR Ser Ql: NONREACTIVE

## 2020-03-07 LAB — SEDIMENTATION RATE: Sed Rate: 15 mm/hr (ref 0–16)

## 2020-03-07 LAB — C-REACTIVE PROTEIN: CRP: 1.1 mg/dL — ABNORMAL HIGH (ref <1.0)

## 2020-03-07 SURGERY — ECHOCARDIOGRAM, TRANSESOPHAGEAL
Anesthesia: Monitor Anesthesia Care

## 2020-03-07 MED ORDER — PROPOFOL 500 MG/50ML IV EMUL
INTRAVENOUS | Status: DC | PRN
  Administered 2020-03-07: 200 ug/kg/min via INTRAVENOUS

## 2020-03-07 MED ORDER — MAGNESIUM SULFATE 2 GM/50ML IV SOLN
2.0000 g | Freq: Once | INTRAVENOUS | Status: AC
  Administered 2020-03-07: 2 g via INTRAVENOUS
  Filled 2020-03-07: qty 50

## 2020-03-07 MED ORDER — PROPOFOL 10 MG/ML IV BOLUS
INTRAVENOUS | Status: DC | PRN
  Administered 2020-03-07: 40 mg via INTRAVENOUS
  Administered 2020-03-07: 60 mg via INTRAVENOUS

## 2020-03-07 MED ORDER — BUTAMBEN-TETRACAINE-BENZOCAINE 2-2-14 % EX AERO
INHALATION_SPRAY | CUTANEOUS | Status: DC | PRN
  Administered 2020-03-07: 2 via TOPICAL

## 2020-03-07 NOTE — Progress Notes (Signed)
Progress Note    Daniel Raymond  ZOX:[id number] DOB: 12-Jun-1985  DOA: 03/04/2020 PCP: Patient, No Pcp Per    Brief Narrative:     Medical records reviewed and are as summarized below:  Daniel Raymond is an 35 y.o. male with hx of substance abuse, hepatitis C who presented to the ER on 03/04/2020 secondary to significant back pain.  Patient was diagnosed with osteomyelitis of the cervical spine several months ago, appears to have been diagnosed in June 2021 at [facility].  Suspected source related to his IV drug use, uses heroin, methamphetamine, and marijuana.  Unfortunately he continues to use.  He has had multiple hospitalizations for similar presentations.  He gets started on IV antibiotics however eventually leaves AMA.  He was last hospitalized at [facility] in November 2021- -left AMA and said he did not take the abx given    Assessment/Plan:   Principal Problem:   Sepsis ([place]) Active Problems:   IV drug abuse ([place])   Tobacco dependence   Subacute osteomyelitis of cervical spine ([place])   Sepsis, possibly secondary to osteomyelitis -Initial presentation, febrile (39.3 C), tachycardic, heart rate 119-- could be withdrawal as well -Continue Vanco and Zosyn for now -MRI:No definite sign of infection in the cervical spine - ESR and CRP done 2/14 -Blood cultures obtained on admission and NGTD -Echocardiogram: No distinct valvular vegetations visualized, however, there is  thickening of the mitral valve, aortic valve (appears bicuspid) and the  tricuspid valve (mild). Recommend TEE for further evaluation if clinical  suspicion for endocarditis is present.  - ID consult appreciated -TEE requested for 2/15   Substance abuse -Reported history of methamphetamines, heroin, marijuana -Ativan prn - can use suboxone but may hasten withdrawal -Will monitor on COWS protocol (5-12 mild symptoms; 13-24 moderate symptoms; 25-36 moderately severe symptoms; >36 severe  symptoms) -TOC team consult for substance abuse education/support -prn orders from the Clonidine withdrawal order set were also ordered.  Tobacco abuse -Recommend cessation -nicotine patch ordered if patient allows     Family Communication/Anticipated D/C date and plan/Code Status   DVT prophylaxis: Lovenox ordered. Code Status: Full Code.  Disposition Plan: Status is: Inpatient  Remains inpatient appropriate because:Inpatient level of care appropriate due to severity of illness   Dispo: The patient is from: Home              Anticipated d/c is to: Home              Anticipated d/c date is: > 3 days              Patient currently is not medically stable to d/c.   Difficult to place patient Yes    Medical Consultants:    ID  Cards (TEE only)     Subjective:   Happy his blood cultures are negative thus far.  Willing to consider rehab for drug use  Objective:    Vitals:   03/06/20 0546 03/06/20 0840 03/06/20 2002 03/07/20 0501  BP: 111/82 125/81 124/68 126/84  Pulse: 64 73 81 76  Resp: 20 20 20 20   Temp: 98.2 F (36.8 C) (!) 97.5 F (36.4 C) 97.8 F (36.6 C) 98.1 F (36.7 C)  TempSrc: Axillary Oral Oral Axillary  SpO2: 100% 100% 99% 100%  Weight:        Intake/Output Summary (Last 24 hours) at 03/07/2020 1040 Last data filed at 03/07/2020 0800 Gross per 24 hour  Intake 853.39 ml  Output 950 ml  Net -96.61 ml   Filed Weights   03/04/20 2018  Weight: 72.6 kg    Exam:  General: Appearance:    male in no acute distress, able to carry on conversation and making more eye contact     Lungs:      respirations unlabored  Heart:    Normal heart rate. Normal rhythm. No murmurs, rubs, or gallops.   MS:   All extremities are intact.   Neurologic:   Awake, alert, oriented x 3.      Data Reviewed:   I have personally reviewed following labs and imaging studies:  Labs: Labs show the following:   Basic Metabolic Panel: Recent Labs  Lab  03/04/20 1823 03/05/20 0505 03/06/20 0137 03/07/20 0143  NA 134* 137 134* 138  K 3.9 3.5 3.3* 3.6  CL 99 103 101 108  CO2 26 26 22 23   GLUCOSE 132* 116* 125* 128*  BUN 16 12 16 14   CREATININE 0.91 0.91 0.86 0.76  CALCIUM 9.4 8.6* 8.7* 8.6*  MG  --  1.7  --   --   PHOS  --  2.8  --   --    GFR Estimated Creatinine Clearance: 133.6 mL/min (by C-G formula based on SCr of 0.76 mg/dL). Liver Function Tests: Recent Labs  Lab 03/04/20 1823 03/05/20 0505  AST 45* 39  ALT 38 30  ALKPHOS 98 83  BILITOT 0.6 1.0  PROT 7.0 6.0*  ALBUMIN 3.9 2.9*   No results for input(s): LIPASE, AMYLASE in the last 168 hours. No results for input(s): AMMONIA in the last 168 hours. Coagulation profile Recent Labs  Lab 03/04/20 1903  INR 1.0    CBC: Recent Labs  Lab 03/04/20 1823 03/06/20 0137  WBC 9.7 4.4  NEUTROABS 8.1*  --   HGB 9.2* 9.0*  HCT 29.7* 27.8*  MCV 86.1 84.0  PLT 272 214   Cardiac Enzymes: No results for input(s): CKTOTAL, CKMB, CKMBINDEX, TROPONINI in the last 168 hours. BNP (last 3 results) No results for input(s): PROBNP in the last 8760 hours. CBG: No results for input(s): GLUCAP in the last 168 hours. D-Dimer: No results for input(s): DDIMER in the last 72 hours. Hgb A1c: No results for input(s): HGBA1C in the last 72 hours. Lipid Profile: No results for input(s): CHOL, HDL, LDLCALC, TRIG, CHOLHDL, LDLDIRECT in the last 72 hours. Thyroid function studies: Recent Labs    03/04/20 1903  TSH 0.713   Anemia work up: No results for input(s): VITAMINB12, FOLATE, FERRITIN, TIBC, IRON, RETICCTPCT in the last 72 hours. Sepsis Labs: Recent Labs  Lab 03/04/20 1822 03/04/20 1823 03/04/20 1903 03/06/20 0137  WBC  --  9.7  --  4.4  LATICACIDVEN 1.2  --  1.0  --     Microbiology Recent Results (from the past 240 hour(s))  Blood Culture (routine x 2)     Status: None (Preliminary result)   Collection Time: 03/04/20  7:03 PM   Specimen: BLOOD  Result Value  Ref Range Status   Specimen Description BLOOD SITE NOT SPECIFIED  Final   Special Requests   Final    BOTTLES DRAWN AEROBIC AND ANAEROBIC Blood Culture adequate volume   Culture   Final    NO GROWTH 2 DAYS Performed at [facility], [address] [place], [place] [address]    Report Status PENDING  Incomplete  Blood Culture (routine x 2)     Status: None (Preliminary result)   Collection Time: 03/04/20  7:03 PM   Specimen: BLOOD  Result Value Ref Range Status   Specimen Description BLOOD SITE NOT SPECIFIED  Final   Special Requests   Final    BOTTLES DRAWN AEROBIC AND ANAEROBIC Blood Culture adequate volume   Culture   Final    NO GROWTH 2 DAYS Performed at [facility], [address] [place], [place] [address]    Report Status PENDING  Incomplete  Resp Panel by RT-PCR (Flu A&B, Covid) Nasopharyngeal Swab     Status: None   Collection Time: 03/05/20  1:15 AM   Specimen: Nasopharyngeal Swab; Nasopharyngeal(NP) swabs in vial transport medium  Result Value Ref Range Status   SARS Coronavirus 2 by RT PCR NEGATIVE NEGATIVE Final    Comment: (NOTE) SARS-CoV-2 target nucleic acids are NOT DETECTED.  The SARS-CoV-2 RNA is generally detectable in upper respiratory specimens during the acute phase of infection. The lowest concentration of SARS-CoV-2 viral copies this assay can detect is 138 copies/mL. A negative result does not preclude SARS-Cov-2 infection and should not be used as the sole basis for treatment or other patient management decisions. A negative result may occur with  improper specimen collection/handling, submission of specimen other than nasopharyngeal swab, presence of viral mutation(s) within the areas targeted by this assay, and inadequate number of viral copies(<138 copies/mL). A negative result must be combined with clinical observations, patient history, and epidemiological information. The expected result is Negative.  Fact Sheet for Patients:   EntrepreneurPulse.com.au  Fact Sheet for Healthcare Providers:  IncredibleEmployment.be  This test is no t yet approved or cleared by the [place] FDA and  has been authorized for detection and/or diagnosis of SARS-CoV-2 by FDA under an Emergency Use Authorization (EUA). This EUA will remain  in effect (meaning this test can be used) for the duration of the COVID-19 declaration under Section 564(b)(1) of the Act, 21 U.S.C.section 360bbb-3(b)(1), unless the authorization is terminated  or revoked sooner.       Influenza A by PCR NEGATIVE NEGATIVE Final   Influenza B by PCR NEGATIVE NEGATIVE Final    Comment: (NOTE) The Xpert Xpress SARS-CoV-2/FLU/RSV plus assay is intended as an aid in the diagnosis of influenza from Nasopharyngeal swab specimens and should not be used as a sole basis for treatment. Nasal washings and aspirates are unacceptable for Xpert Xpress SARS-CoV-2/FLU/RSV testing.  Fact Sheet for Patients: EntrepreneurPulse.com.au  Fact Sheet for Healthcare Providers: IncredibleEmployment.be  This test is not yet approved or cleared by the [place] FDA and has been authorized for detection and/or diagnosis of SARS-CoV-2 by FDA under an Emergency Use Authorization (EUA). This EUA will remain in effect (meaning this test can be used) for the duration of the COVID-19 declaration under Section 564(b)(1) of the Act, 21 U.S.C. section 360bbb-3(b)(1), unless the authorization is terminated or revoked.  Performed at [facility], [place] [address], [place], [place] [address]   Urine culture     Status: None   Collection Time: 03/05/20  1:18 AM   Specimen: In/Out Cath Urine  Result Value Ref Range Status   Specimen Description IN/OUT CATH URINE  Final   Special Requests NONE  Final   Culture   Final    NO GROWTH Performed at [facility], [place] [address], [place], [place]  [address]    Report Status 03/06/2020 FINAL  Final    Procedures and diagnostic studies:  No results found.  Medications:   . buprenorphine-naloxone  1 tablet  Sublingual BID  . cloNIDine  0.1 mg Oral QID   Followed by  . cloNIDine  0.1 mg Oral BH-qamhs   Followed by  . [START ON 03/10/2020] cloNIDine  0.1 mg Oral QAC breakfast  . enoxaparin (LOVENOX) injection  40 mg Subcutaneous Q24H  . folic acid  1 mg Oral Daily  . nicotine  14 mg Transdermal Daily  . thiamine  100 mg Oral Daily   Continuous Infusions: . sodium chloride 20 mL/hr at 03/07/20 0800  . cefTRIAXone (ROCEPHIN)  IV Stopped (03/06/20 2158)  . magnesium sulfate bolus IVPB 2 g (03/07/20 1030)  . vancomycin Stopped (03/07/20 0333)     LOS: 3 days   Geradine Girt  [facility]   How to contact the [facility] Attending or Consulting provider [place] or covering provider during after hours [place], for this patient?  1. Check the care team in [facility] and look for a) attending/consulting [facility] provider listed and b) the [facility] team listed 2. Log into [email or web address] and use [place]'s universal password to access. If you do not have the password, please contact the hospital operator. 3. Locate the [facility] provider you are looking for under [facility] and page to a number that you can be directly reached. 4. If you still have difficulty reaching the provider, please page the [facility] (Director on Call) for the Hospitalists listed on amion for assistance.  03/07/2020, 10:40 AM

## 2020-03-07 NOTE — Anesthesia Preprocedure Evaluation (Signed)
Anesthesia Evaluation  Patient identified by MRN, date of birth, ID band Patient awake    Reviewed: Allergy & Precautions, H&P , NPO status , Patient's Chart, lab work & pertinent test results  Airway Mallampati: II   Neck ROM: full    Dental   Pulmonary Current Smoker and Patient abstained from smoking.,    breath sounds clear to auscultation       Cardiovascular  Rhythm:regular Rate:Normal  bacteremia   Neuro/Psych    GI/Hepatic (+)     substance abuse  IV drug use, Hepatitis -, C  Endo/Other    Renal/GU      Musculoskeletal   Abdominal   Peds  Hematology  (+) anemia ,   Anesthesia Other Findings   Reproductive/Obstetrics                             Anesthesia Physical Anesthesia Plan  ASA: II  Anesthesia Plan: MAC   Post-op Pain Management:    Induction: Intravenous  PONV Risk Score and Plan: 1 and Propofol infusion and Treatment may vary due to age or medical condition  Airway Management Planned: Nasal Cannula  Additional Equipment:   Intra-op Plan:   Post-operative Plan:   Informed Consent: I have reviewed the patients History and Physical, chart, labs and discussed the procedure including the risks, benefits and alternatives for the proposed anesthesia with the patient or authorized representative who has indicated his/her understanding and acceptance.     Dental advisory given  Plan Discussed with: CRNA, Anesthesiologist and Surgeon  Anesthesia Plan Comments:         Anesthesia Quick Evaluation

## 2020-03-07 NOTE — Progress Notes (Addendum)
[facility] Infectious Diseases Follow Up Note  Patient Identification: Patient Name: Daniel Raymond MRN: [id number] [place] Date: 03/04/2020  5:27 PM Age: 35 y.o.Today's Date: 03/07/2020   Reason for Visit: concernf or endocarditis   Principal Problem:   Sepsis [facility]) Active Problems:   IV drug abuse ([place])   Tobacco dependence   Subacute osteomyelitis of cervical spine ([place])  Antibiotics: Vancomycin 2/12- c                    Zosyn 2/12-2/14                    Ceftriaxone 2/14-c  Lines/Tubes: PIVs   Assessment # H/o Cervical facet septic arthritis/osteomyelitis with worsening posterior neck pain  -He was treated empirically with Vancomycin and ceftriaxone until 12/12/20 when he left AMA and was given Linezolid and levofloxacin on discharge for 6 days to continue until end of 6 weeks 11/28. He told me he took the antibiotics as prescribed ( linezolid and levofloxacin for 6 days) after discharge from the hospital. However, he seems to have denied taking PO antibiotics to other providers per chart review.  -MRI c spine here with concerns of chronic sequelae of septic facet arthritis and no definitive sign of infection in the c spine/chronic sequela of septic facet arthritis -TEE prelim with no vegetations and endocarditis - Blood cx NG in 2 days   # Active IVDU- RPR NR, urine GC negative  # Hepatitis C - to be treated as OP  # Fevers - only one occurrence, normal WBC count and otherwise stable.  Recommendations  -TEE prelim with no vegetations and endocarditis -Given patient has completed 6 weeks of treatment in November ( left AMA 6 days prior to end date and completed remaining 6 days of treatment with PO antibiotics upon discharge), normal ESR and CRP with no definite evidence of c spine infection in MRI, no positive blood cultures, it is  less likely he has new c spine infection -Can DC antibiotics -Will need assistance with  substance use upon discharge  Rest of the management as per the primary team. Thank you for the consult. Please page with pertinent questions or concerns.  ______________________________________________________________________ Subjective patient seen and examined at the bedside. He is complaining of pain at the IV line site in his rt upper extremity. Neck pain is better than yesterday. Planned to go for TEE today. Denies N/V/D. Rt UE numbness for last 2-3 months    Vitals BP 126/84 (BP Location: Left Arm)   Pulse 76   Temp 98.1 F (36.7 C) (Axillary)   Resp 20   Wt 72.6 kg   SpO2 100%   BMI 22.97 kg/m    Physical Exam Constitutional: not in acute distress Comments:   Cardiovascular:  Rate and Rhythm: Normal rateand regular rhythm.  Heart sounds: Systolic murmur+  Pulmonary:  Effort: Pulmonary effort is normal.  Comments: clear air fields   Abdominal:  Palpations: Abdomen is soft.  Tenderness: Non tender  Musculoskeletal:  General: No c spine tenderness Skin: Comments: no obvious rashes, lesions  Neurological:  General: No focal deficitpresent. RUE 4/5 and LUE 5/5 power   Psychiatric:  Mood and Affect: Moodnormal.   Pertinent Microbiology Results for orders placed or performed during the hospital encounter of 03/04/20  Blood Culture (routine x 2)     Status: None (Preliminary result)   Collection Time: 03/04/20  7:03 PM   Specimen: BLOOD  Result Value Ref Range Status  Specimen Description BLOOD SITE NOT SPECIFIED  Final   Special Requests   Final    BOTTLES DRAWN AEROBIC AND ANAEROBIC Blood Culture adequate volume   Culture   Final    NO GROWTH 2 DAYS Performed at [facility], [address] [place], [place] [address]    Report Status PENDING  Incomplete  Blood Culture (routine x 2)     Status: None (Preliminary result)   Collection Time: 03/04/20  7:03 PM   Specimen: BLOOD  Result Value Ref Range  Status   Specimen Description BLOOD SITE NOT SPECIFIED  Final   Special Requests   Final    BOTTLES DRAWN AEROBIC AND ANAEROBIC Blood Culture adequate volume   Culture   Final    NO GROWTH 2 DAYS Performed at [facility], [address], [place], [place] [address]    Report Status PENDING  Incomplete  Resp Panel by RT-PCR (Flu A&B, Covid) Nasopharyngeal Swab     Status: None   Collection Time: 03/05/20  1:15 AM   Specimen: Nasopharyngeal Swab; Nasopharyngeal(NP) swabs in vial transport medium  Result Value Ref Range Status   SARS Coronavirus 2 by RT PCR NEGATIVE NEGATIVE Final    Comment: (NOTE) SARS-CoV-2 target nucleic acids are NOT DETECTED.  The SARS-CoV-2 RNA is generally detectable in upper respiratory specimens during the acute phase of infection. The lowest concentration of SARS-CoV-2 viral copies this assay can detect is 138 copies/mL. A negative result does not preclude SARS-Cov-2 infection and should not be used as the sole basis for treatment or other patient management decisions. A negative result may occur with  improper specimen collection/handling, submission of specimen other than nasopharyngeal swab, presence of viral mutation(s) within the areas targeted by this assay, and inadequate number of viral copies(<138 copies/mL). A negative result must be combined with clinical observations, patient history, and epidemiological information. The expected result is Negative.  Fact Sheet for Patients:  EntrepreneurPulse.com.au  Fact Sheet for Healthcare Providers:  IncredibleEmployment.be  This test is no t yet approved or cleared by the [place] FDA and  has been authorized for detection and/or diagnosis of SARS-CoV-2 by FDA under an Emergency Use Authorization (EUA). This EUA will remain  in effect (meaning this test can be used) for the duration of the COVID-19 declaration under Section 564(b)(1) of the Act,  21 U.S.C.section 360bbb-3(b)(1), unless the authorization is terminated  or revoked sooner.       Influenza A by PCR NEGATIVE NEGATIVE Final   Influenza B by PCR NEGATIVE NEGATIVE Final    Comment: (NOTE) The Xpert Xpress SARS-CoV-2/FLU/RSV plus assay is intended as an aid in the diagnosis of influenza from Nasopharyngeal swab specimens and should not be used as a sole basis for treatment. Nasal washings and aspirates are unacceptable for Xpert Xpress SARS-CoV-2/FLU/RSV testing.  Fact Sheet for Patients: EntrepreneurPulse.com.au  Fact Sheet for Healthcare Providers: IncredibleEmployment.be  This test is not yet approved or cleared by the [place] FDA and has been authorized for detection and/or diagnosis of SARS-CoV-2 by FDA under an Emergency Use Authorization (EUA). This EUA will remain in effect (meaning this test can be used) for the duration of the COVID-19 declaration under Section 564(b)(1) of the Act, 21 U.S.C. section 360bbb-3(b)(1), unless the authorization is terminated or revoked.  Performed at [facility], [place] [address], [place], [place] [address]   Urine culture     Status: None   Collection Time: 03/05/20  1:18 AM  Specimen: In/Out Cath Urine  Result Value Ref Range Status   Specimen Description IN/OUT CATH URINE  Final   Special Requests NONE  Final   Culture   Final    NO GROWTH Performed at [facility], [address], [place], [place] [address]    Report Status 03/06/2020 FINAL  Final    Pertinent Lab CBC Latest Ref Rng & Units 03/06/2020 03/04/2020 12/13/2019  WBC 4.0 - 10.5 K/uL 4.4 9.7 4.0  Hemoglobin 13.0 - 17.0 g/dL 9.0(L) 9.2(L) 10.9(L)  Hematocrit 39.0 - 52.0 % 27.8(L) 29.7(L) 32.6(L)  Platelets 150 - 400 K/uL 214 272 190   CMP Latest Ref Rng & Units 03/07/2020 03/06/2020 03/05/2020  Glucose 70 - 99 mg/dL 128(H) 125(H) 116(H)  BUN 6 - 20 mg/dL 14 16 12   Creatinine 0.61 - 1.24 mg/dL 0.76  0.86 0.91  Sodium 135 - 145 mmol/L 138 134(L) 137  Potassium 3.5 - 5.1 mmol/L 3.6 3.3(L) 3.5  Chloride 98 - 111 mmol/L 108 101 103  CO2 22 - 32 mmol/L 23 22 26   Calcium 8.9 - 10.3 mg/dL 8.6(L) 8.7(L) 8.6(L)  Total Protein 6.5 - 8.1 g/dL - - 6.0(L)  Total Bilirubin 0.3 - 1.2 mg/dL - - 1.0  Alkaline Phos 38 - 126 U/L - - 83  AST 15 - 41 U/L - - 39  ALT 0 - 44 U/L - - 30     Pertinent Imaging today Plain films and CT images have been personally visualized and interpreted; radiology reports have been reviewed. Decision making incorporated into the Impression / Recommendations.  I have spent approx 30 minutes for this patient encounter including review of prior medical records with greater than 50% of time being face to face and coordination of their care.  Electronically signed by:   [name], MD Infectious Disease Physician [facility] Infectious Disease Pager: [phone]

## 2020-03-07 NOTE — Progress Notes (Signed)
  Echocardiogram Echocardiogram Transesophageal has been performed.  Daniel Raymond 03/07/2020, 3:14 PM

## 2020-03-07 NOTE — Progress Notes (Signed)
Pharmacy Antibiotic Note  Daniel Raymond is a 35 y.o. male admitted on 03/04/2020 with sepsis.  Pharmacy has been consulted for vancomycin dosing.  Has hx of spinal infection/osteomyelitis - left AMA in Oct 2021 on linezolid/levaquin at that time. Now presenting with back pain - WBC WNL, Tmax 102.8. Scr 0.76 (CrCl>100 mL/min).  Blood cultures NGTD, TEE pending  Plan: Continue Vancomycin 1250 mg IV every 12 hours (est AUC 462, using Scr 0.9) Vancomycin levels this afternoon Ceftriaxone 2gm IV q24h Monitor renal fx, cx results, clinical pic, and vanc levels as indicated   Weight: 72.6 kg (160 lb 0.9 oz)  Temp (24hrs), Avg:97.8 F (36.6 C), Min:97.5 F (36.4 C), Max:98.1 F (36.7 C)  Recent Labs  Lab 03/04/20 1822 03/04/20 1823 03/04/20 1903 03/05/20 0505 03/06/20 0137 03/07/20 0143  WBC  --  9.7  --   --  4.4  --   CREATININE  --  0.91  --  0.91 0.86 0.76  LATICACIDVEN 1.2  --  1.0  --   --   --     Estimated Creatinine Clearance: 133.6 mL/min (by C-G formula based on SCr of 0.76 mg/dL).    Allergies  Allergen Reactions  . Ibuprofen Nausea And Vomiting, Nausea Only and Other (See Comments)    Stomach pains    Antimicrobials this admission: Vancomycin 2/12 >>  Zosyn 2/12 >> 2/14 CTX 2/14>>  Dose adjustments this admission: N/A   Microbiology results: 2/12 BCx: sent 2/12 UCx: sent  2/12 COVID PCR: sent  [name] A. Jeanella Craze, PharmD, BCPS, FNKF Clinical Pharmacist [place] Please utilize Amion for appropriate phone number to reach the unit pharmacist [facility])   After 10:00 PM, call Main Pharmacy [phone]

## 2020-03-07 NOTE — H&P (Signed)
Daniel Raymond is a 35 y.o. male who has presented today for surgery, with the diagnosis of osteomyelitis, bicuspid aortic valve.  The various methods of treatment have been discussed with the patient and family. After consideration of risks, benefits and other options for treatment, the patient has consented to  Procedure(s): TRANSESOPHAGEAL ECHOCARDIOGRAM (TEE) (N/A) as a surgical intervention .  The patient's history has been reviewed, patient examined, no change in status, stable for surgery.  I have reviewed the patient's chart and labs.  Questions were answered to the patient's satisfaction.    Najae Rathert C. Duke Salvia, MD, [facility]  03/07/2020 2:08 PM

## 2020-03-07 NOTE — Transfer of Care (Signed)
Immediate Anesthesia Transfer of Care Note  Patient: Daniel Raymond  Procedure(s) Performed: TRANSESOPHAGEAL ECHOCARDIOGRAM (TEE) (N/A )  Patient Location: Endoscopy Unit  Anesthesia Type:MAC  Level of Consciousness: drowsy  Airway & Oxygen Therapy: Patient Spontanous Breathing  Post-op Assessment: Report given to RN, Post -op Vital signs reviewed and stable and Patient moving all extremities  Post vital signs: Reviewed and stable  Last Vitals:  Vitals Value Taken Time  BP 109/69 03/07/20 1450  Temp 36.1 C 03/07/20 1450  Pulse 66 03/07/20 1454  Resp 19 03/07/20 1454  SpO2 99 % 03/07/20 1454  Vitals shown include unvalidated device data.  Last Pain:  Vitals:   03/07/20 1450  TempSrc: Temporal  PainSc: 0-No pain      Patients Stated Pain Goal: 0 (11/23/13 9458)  Complications: No complications documented.

## 2020-03-07 NOTE — Anesthesia Procedure Notes (Signed)
Procedure Name: MAC Date/Time: 03/07/2020 2:30 PM Performed by: Amadeo Garnet, CRNA Pre-anesthesia Checklist: Patient identified, Emergency Drugs available, Suction available and Patient being monitored Patient Re-evaluated:Patient Re-evaluated prior to induction Oxygen Delivery Method: Nasal cannula Preoxygenation: Pre-oxygenation with 100% oxygen Induction Type: IV induction Placement Confirmation: positive ETCO2 Dental Injury: Teeth and Oropharynx as per pre-operative assessment

## 2020-03-07 NOTE — CV Procedure (Signed)
Brief TEE Note  LVEF 60-65% Bicuspid aortic valve.  Fusion of the left and right coronary cusps.  No aortic stenosis.  Mild aortic regurgitation. No endocarditis.  No LA/LAA thrombus or masses.  For additional details see full report.  Gissela Bloch C. Duke Salvia, MD, [facility] 03/07/2020  2:48 PM

## 2020-03-07 NOTE — Progress Notes (Signed)
Patient called RN to bedside. Upon arrival, patient asked this nurse to remove IV and stated that he was leaving. This nurse informed patient that he still needs treatment and that the doctor does not feel he is ready for discharge. Patient states "I don't have an infection in my heart, and I am ready to leave." Patient educated about leaving AMA. Patient states "I am sick and just need a fix. I know that I shouldn't, but it is what I want right now." [name], DO notified. IV removed.

## 2020-03-08 NOTE — Discharge Summary (Signed)
[facility]      Physician Discharge Summary  Esmeralda LinksBryan Riegler EAV:[id number] DOB: Jul 21, 1985 DOA: 03/04/2020  PCP: Daniel Raymond, No Pcp Per  Admit date: 03/04/2020 Discharge date: 03/08/2020    Recommendations for Outpatient Follow-Up:   1. Left AMA  2. TEE and blood cultures negative   Discharge Diagnosis:   Principal Problem:   Sepsis (HCC) Active Problems:   IV drug abuse (HCC)   Tobacco dependence   Subacute osteomyelitis of cervical spine [facility](HCC)     Hospital Course by Problem:   Left AMA after TEE    Medical Consultants:   ID Cards (TEE)   Discharge Exam:   Vitals:   03/07/20 1500 03/07/20 1535  BP: 109/64 123/73  Pulse: 61 (!) 106  Resp: 17 15  Temp:  98 F (36.7 C)  SpO2: 99% 95%   Vitals:   03/07/20 1331 03/07/20 1450 03/07/20 1500 03/07/20 1535  BP: 124/69 109/69 109/64 123/73  Pulse: (!) 58 71 61 (!) 106  Resp: 18 (!) 21 17 15   Temp: 97.7 F (36.5 C) (!) 97 F (36.1 C)  98 F (36.7 C)  TempSrc: Oral Temporal    SpO2: 100% 98% 99% 95%  Weight: 68 kg     Height: 5\' 10"  (1.778 m)       Left AMA  The results of significant diagnostics from this hospitalization (including imaging, microbiology, ancillary and laboratory) are listed below for reference.     Procedures and Diagnostic Studies:   MR Cervical Spine W or Wo Contrast  Result Date: 03/04/2020 CLINICAL DATA:  Acute neck pain with suspicion of infection. EXAM: MRI CERVICAL SPINE WITHOUT AND WITH CONTRAST TECHNIQUE: Multiplanar and multiecho pulse sequences of the cervical spine, to include the craniocervical junction and cervicothoracic junction, were obtained without and with intravenous contrast. CONTRAST:  7.565mL GADAVIST GADOBUTROL 1 MMOL/ML IV SOLN COMPARISON:  12/03/2019.  11/18/2019.  05/07/2019. FINDINGS: Alignment: Chronic kyphotic curvature with the apex at C5-6. Vertebrae: See below for descriptions at each level. Cord: No cord compression or primary cord lesion. Effacement of the  ventral subarachnoid space at the C5-6 level with slight indentation of the ventral cord. AP diameter of the canal in the midline 8 mm. Posterior Fossa, vertebral arteries, paraspinal tissues: Negative Disc levels: Foramen magnum sufficiently patent. Ordinary osteoarthritis at the C1-2 articulation. C2-3: Chronic facet arthropathy on the left. Endplate osteophytes and mild bulging of the disc. Mild facet arthritis on the right. Mild edema and enhancement. The findings are nonspecific and could be due to degenerative arthropathy or chronic sequela of septic facet arthritis. Left foraminal narrowing. C3-4: Endplate osteophytes and bulging of the disc. Facet osteoarthritis on the left. Left foraminal narrowing. C4-5: No disc space pathology. Facet osteoarthritis on the left. Mild left foraminal narrowing. C5-6: Chronic loss of disc height with chronic bone fusion. Kyphotic deformity. Vertebral body endplate encroachment upon the canal with AP diameter of 8 mm, effacing the subarachnoid space and indenting the ventral cord slightly. Mild bilateral foraminal narrowing, not grossly compressive. C6-7: Chronic disc degeneration. Endplate changes of sclerosis and mild enhancement. Bilateral foraminal narrowing due to osteophytic encroachment. No compressive central canal stenosis. Findings at this level are favored to be degenerative. Cannot rule out the possibility of resolved infection but that is not favored. C7-T1: Endplate osteophytes and mild bulging of the disc. No compressive stenosis. IMPRESSION: 1. No definite sign of infection in the cervical spine. 2. 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 Canal  narrowing with AP diameter of 8 mm and slight indentation of the ventral cord. No abnormal cord signal at this level. Mild bilateral foraminal narrowing, not grossly compressive. 3. C2-3: Chronic facet arthropathy on the left. Mild edema and enhancement. This could be  due to degenerative arthropathy or chronic sequela of septic facet arthritis. Mild foraminal narrowing on the left. 4. C3-4: Left foraminal narrowing because of osteophytic encroachment from facet osteoarthritis. 5. C4-5: Left foraminal narrowing because of osteophytic encroachment from facet arthritis. 6. C6-7: Chronic disc degeneration. Endplate changes of sclerosis and mild enhancement. No compressive central canal stenosis. Bilateral foraminal narrowing due to osteophytic encroachment. Findings at this level are favored to be degenerative. Cannot rule out the possibility of resolved infection but that is not favored. Electronically Signed   By: Paulina Fusi M.D.   On: 03/04/2020 22:28   MR THORACIC SPINE W WO CONTRAST  Result Date: 03/04/2020 CLINICAL DATA:  Back pain.  Question infection. EXAM: MRI THORACIC WITHOUT AND WITH CONTRAST TECHNIQUE: Multiplanar and multiecho pulse sequences of the thoracic spine were obtained without and with intravenous contrast. CONTRAST:  7.18mL GADAVIST GADOBUTROL 1 MMOL/ML IV SOLN COMPARISON:  CT 05-Jun-2019 FINDINGS: Alignment:  Mild curvature convex to the right. Vertebrae: Endplate edema and enhancement at the T9-10 level, worse on the left. Right side is preserved. Findings are favored to be degenerative. Degenerative changes were seen on the CT of 2019-06-05. Lack 0 pronounced progression and lack of disc space T2 signal or enhancement argue against infection. No paravertebral collection. Cord:  No cord compression or primary cord lesion. Paraspinal and other soft tissues: Negative. No paravertebral inflammatory findings. Disc levels: T1-2: Normal T2-3 through T8-9: Disc bulges. No compressive stenosis of the canal or foramina. T9-10: Endplate osteophytes and shallow protrusion of the disc more prominent on the left. Narrowing of the ventral subarachnoid space but no compression of the cord. Moderate foraminal narrowing on the left could possibly affect the left T9 nerve.  See above discussion, favoring degenerative change over infection. T10-11, T11-12 and T12-L1: Noncompressive disc bulges. IMPRESSION: Degenerative endplate edema and enhancement at the T9-10 level, worse on the left. Endplate osteophytes and shallow protrusion of the disc more prominent on the left. Moderate foraminal narrowing on the left could possibly affect the left T9 nerve. Degenerative change at this level is similar but slightly more pronounced than was seen on the CT of Jun 05, 2019. I do not think the findings at this level are likely due to spinal infection. Of course, very early infection could not be excluded, and follow-up may be warranted. Electronically Signed   By: Paulina Fusi M.D.   On: 03/04/2020 22:34   DG Chest Port 1 View  Result Date: 03/04/2020 CLINICAL DATA:  Questionable sepsis - evaluate for abnormality Daniel Raymond reports hospitalization months ago for spinal infection/osteomyelitis and left AMA. Severe upper back pain. EXAM: PORTABLE CHEST 1 VIEW COMPARISON:  Radiograph 07/31/2017 FINDINGS: Low lung volumes. Prominent heart size likely accentuated by low lung volumes. Vague opacity in the right mid lung as well as questionable right perihilar opacity, partially obscured by overlying EKG leads. No pneumothorax or pleural effusion. No pulmonary edema. No acute osseous abnormalities are seen on this portable AP view. IMPRESSION: 1. Low lung volumes with vague opacity in the right mid lung and questionable right perihilar opacity. Findings may represent pneumonia in the appropriate clinical setting. 2. Prominent heart size likely accentuated by low lung volumes. Consider PA and lateral views when Daniel Raymond is able. Electronically Signed  By: Narda Rutherford M.D.   On: 03/04/2020 19:13   ECHOCARDIOGRAM COMPLETE  Result Date: 03/05/2020    ECHOCARDIOGRAM REPORT   Daniel Raymond Name:   RYLEN SWINDLER Date of Exam: 03/05/2020 Medical Rec #:  [id number]   Height:       70.0 in Accession #:    [id number]   Weight:       160.1 lb Date of Birth:  May 28, 1985   BSA:          1.899 m Daniel Raymond Age:    34 years    BP:           133/89 mmHg Daniel Raymond Gender: M           HR:           68 bpm. Exam Location:  Inpatient Procedure: 2D Echo Indications:    bacteremia  History:        Daniel Raymond has no prior history of Echocardiogram examinations.                 Sepsis; Risk Factors:Current Smoker and IV drug use.  Sonographer:    Delcie Roch Referring Phys: [id number] Kieth Brightly MACNEIL  Sonographer Comments: Image acquisition challenging due to uncooperative Daniel Raymond. IMPRESSIONS  1. No distinct valvular vegetations visualized, however, there is thickening of the mitral valve, aortic valve (appears bicuspid) and the tricuspid valve (mild). Recommend TEE for further evaluation if clinical suspicion for endocarditis is present.  2. Left ventricular ejection fraction, by estimation, is 60 to 65%. The left ventricle has normal function. The left ventricle has no regional wall motion abnormalities. Left ventricular diastolic parameters were normal.  3. Right ventricular systolic function is normal. The right ventricular size is normal.  4. The mitral valve is mildly thickened. Trivial mitral valve regurgitation.  5. TV appears mildly thickened. Mild tricuspid regurgitation.  6. The aortic valve appears bicuspid. There is moderate thickening of the aortic valve. Aortic valve regurgitation is mild.  7. Aortic dilatation noted. There is borderline dilatation of the ascending aorta, measuring 36 mm.  8. The inferior vena cava is normal in size with greater than 50% respiratory variability, suggesting right atrial pressure of 3 mmHg. Comparison(s): No prior Echocardiogram. Conclusion(s)/Recommendation(s): No evidence of valvular vegetations on this transthoracic echocardiogram. Would recommend a transesophageal echocardiogram to exclude infective endocarditis if clinically indicated. FINDINGS  Left Ventricle: Left ventricular ejection  fraction, by estimation, is 60 to 65%. The left ventricle has normal function. The left ventricle has no regional wall motion abnormalities. The left ventricular internal cavity size was normal in size. There is  no left ventricular hypertrophy. Left ventricular diastolic parameters were normal. Right Ventricle: The right ventricular size is normal. No increase in right ventricular wall thickness. Right ventricular systolic function is normal. Left Atrium: Left atrial size was normal in size. Right Atrium: Right atrial size was normal in size. Pericardium: There is no evidence of pericardial effusion. Mitral Valve: The mitral valve is grossly normal. There is mild thickening of the mitral valve leaflet(s). Trivial mitral valve regurgitation. Tricuspid Valve: TV appears mildly thickened. The tricuspid valve is normal in structure. Tricuspid valve regurgitation is mild. Aortic Valve: The aortic valve is bicuspid. There is moderate thickening of the aortic valve. Aortic valve regurgitation is mild. Aortic regurgitation PHT measures 335 msec. Pulmonic Valve: The pulmonic valve was normal in structure. Pulmonic valve regurgitation is trivial. Aorta: Aortic dilatation noted. There is borderline dilatation of the ascending aorta, measuring 36 mm. Venous: The inferior vena  cava is normal in size with greater than 50% respiratory variability, suggesting right atrial pressure of 3 mmHg. IAS/Shunts: No atrial level shunt detected by color flow Doppler.  LEFT VENTRICLE PLAX 2D LVIDd:         4.60 cm  Diastology LVIDs:         3.00 cm  LV e' medial:    12.40 cm/s LV PW:         1.00 cm  LV E/e' medial:  9.8 LV IVS:        0.90 cm  LV e' lateral:   16.10 cm/s LVOT diam:     2.40 cm  LV E/e' lateral: 7.5 LV SV:         121 LV SV Index:   64 LVOT Area:     4.52 cm  RIGHT VENTRICLE             IVC RV S prime:     17.20 cm/s  IVC diam: 1.80 cm TAPSE (M-mode): 3.8 cm LEFT ATRIUM             Index       RIGHT ATRIUM           Index LA  diam:        3.40 cm 1.79 cm/m  RA Area:     14.40 cm LA Vol (A2C):   62.7 ml 33.02 ml/m RA Volume:   35.60 ml  18.75 ml/m LA Vol (A4C):   37.3 ml 19.64 ml/m LA Biplane Vol: 48.6 ml 25.60 ml/m  AORTIC VALVE LVOT Vmax:   134.00 cm/s LVOT Vmean:  88.600 cm/s LVOT VTI:    0.267 m AI PHT:      335 msec  AORTA Ao Root diam: 3.60 cm Ao Asc diam:  3.60 cm MITRAL VALVE MV Area (PHT): 4.06 cm     SHUNTS MV Decel Time: 187 msec     Systemic VTI:  0.27 m MV E velocity: 121.00 cm/s  Systemic Diam: 2.40 cm MV A velocity: 76.60 cm/s MV E/A ratio:  1.58 Laurance Flatten MD Electronically signed by Laurance Flatten MD Signature Date/Time: 03/05/2020/12:09:56 PM    Final      Labs:   Basic Metabolic Panel: Recent Labs  Lab 03/04/20 1823 03/05/20 0505 03/06/20 0137 03/07/20 0143  NA 134* 137 134* 138  K 3.9 3.5 3.3* 3.6  CL 99 103 101 108  CO2 GLUCOSE 132* 116* 125* 128*  BUN CREATININE 0.91 0.91 0.86 0.76  CALCIUM 9.4 8.6* 8.7* 8.6*  MG  --  1.7  --   --   PHOS  --  2.8  --   --    GFR Estimated Creatinine Clearance: 125.1 mL/min (by C-G formula based on SCr of 0.76 mg/dL). Liver Function Tests: Recent Labs  Lab 03/04/20 1823 03/05/20 0505  AST 45* 39  ALT 38 30  ALKPHOS 98 83  BILITOT 0.6 1.0  PROT 7.0 6.0*  ALBUMIN 3.9 2.9*   No results for input(s): LIPASE, AMYLASE in the last 168 hours. No results for input(s): AMMONIA in the last 168 hours. Coagulation profile Recent Labs  Lab 03/04/20 1903  INR 1.0    CBC: Recent Labs  Lab 03/04/20 1823 03/06/20 0137  WBC 9.7 4.4  NEUTROABS 8.1*  --   HGB 9.2* 9.0*  HCT 29.7* 27.8*  MCV 86.1 84.0  PLT 272 214   Cardiac Enzymes: No results for input(s): CKTOTAL,  CKMB, CKMBINDEX, TROPONINI in the last 168 hours. BNP: Invalid input(s): POCBNP CBG: No results for input(s): GLUCAP in the last 168 hours. D-Dimer No results for input(s): DDIMER in the last 72 hours. Hgb A1c No results for input(s):  HGBA1C in the last 72 hours. Lipid Profile No results for input(s): CHOL, HDL, LDLCALC, TRIG, CHOLHDL, LDLDIRECT in the last 72 hours. Thyroid function studies No results for input(s): TSH, T4TOTAL, T3FREE, THYROIDAB in the last 72 hours.  Invalid input(s): FREET3 Anemia work up No results for input(s): VITAMINB12, FOLATE, FERRITIN, TIBC, IRON, RETICCTPCT in the last 72 hours. Microbiology Recent Results (from the past 240 hour(s))  Blood Culture (routine x 2)     Status: None (Preliminary result)   Collection Time: 03/04/20  7:03 PM   Specimen: BLOOD  Result Value Ref Range Status   Specimen Description BLOOD SITE NOT SPECIFIED  Final   Special Requests   Final    BOTTLES DRAWN AEROBIC AND ANAEROBIC Blood Culture adequate volume   Culture   Final    NO GROWTH 4 DAYS Performed at [facility], [address], [place], [place] [address]    Report Status PENDING  Incomplete  Blood Culture (routine x 2)     Status: None (Preliminary result)   Collection Time: 03/04/20  7:03 PM   Specimen: BLOOD  Result Value Ref Range Status   Specimen Description BLOOD SITE NOT SPECIFIED  Final   Special Requests   Final    BOTTLES DRAWN AEROBIC AND ANAEROBIC Blood Culture adequate volume   Culture   Final    NO GROWTH 4 DAYS Performed at [facility], [address], [place], [place] [address]    Report Status PENDING  Incomplete  Resp Panel by RT-PCR (Flu A&B, Covid) Nasopharyngeal Swab     Status: None   Collection Time: 03/05/20  1:15 AM   Specimen: Nasopharyngeal Swab; Nasopharyngeal(NP) swabs in vial transport medium  Result Value Ref Range Status   SARS Coronavirus 2 by RT PCR NEGATIVE NEGATIVE Final    Comment: (NOTE) SARS-CoV-2 target nucleic acids are NOT DETECTED.  The SARS-CoV-2 RNA is generally detectable in upper respiratory specimens during the acute phase of infection. The lowest concentration of SARS-CoV-2 viral copies this assay can detect is 138 copies/mL. A  negative result does not preclude SARS-Cov-2 infection and should not be used as the sole basis for treatment or other Daniel Raymond management decisions. A negative result may occur with  improper specimen collection/handling, submission of specimen other than nasopharyngeal swab, presence of viral mutation(s) within the areas targeted by this assay, and inadequate number of viral copies(<138 copies/mL). A negative result must be combined with clinical observations, Daniel Raymond history, and epidemiological information. The expected result is Negative.  Fact Sheet for Patients:  BloggerCourse.com  Fact Sheet for Healthcare Providers:  SeriousBroker.it  This test is no t yet approved or cleared by the [place] FDA and  has been authorized for detection and/or diagnosis of SARS-CoV-2 by FDA under an Emergency Use Authorization (EUA). This EUA will remain  in effect (meaning this test can be used) for the duration of the COVID-19 declaration under Section 564(b)(1) of the Act, 21 U.S.C.section 360bbb-3(b)(1), unless the authorization is terminated  or revoked sooner.       Influenza A by PCR NEGATIVE NEGATIVE Final   Influenza B by PCR NEGATIVE NEGATIVE Final    Comment: (NOTE) The Xpert Xpress SARS-CoV-2/FLU/RSV plus assay is intended as an aid in  the diagnosis of influenza from Nasopharyngeal swab specimens and should not be used as a sole basis for treatment. Nasal washings and aspirates are unacceptable for Xpert Xpress SARS-CoV-2/FLU/RSV testing.  Fact Sheet for Patients: BloggerCourse.com  Fact Sheet for Healthcare Providers: SeriousBroker.it  This test is not yet approved or cleared by the [place] FDA and has been authorized for detection and/or diagnosis of SARS-CoV-2 by FDA under an Emergency Use Authorization (EUA). This EUA will remain in effect (meaning this test can  be used) for the duration of the COVID-19 declaration under Section 564(b)(1) of the Act, 21 U.S.C. section 360bbb-3(b)(1), unless the authorization is terminated or revoked.  Performed at [facility], [address], [place], [place] [address]   Urine culture     Status: None   Collection Time: 03/05/20  1:18 AM   Specimen: In/Out Cath Urine  Result Value Ref Range Status   Specimen Description IN/OUT CATH URINE  Final   Special Requests NONE  Final   Culture   Final    NO GROWTH Performed at [facility], [address] [place], [place] [address]    Report Status 03/06/2020 FINAL  Final     Discharge Instructions:    Allergies as of 03/07/2020      Reactions   Ibuprofen Nausea And Vomiting, Nausea Only, Other (See Comments)   Stomach pains      Medication List    You have not been prescribed any medications.         Signed:  Joseph Art DO  [facility] 03/08/2020, 8:26 AM

## 2020-03-08 NOTE — Anesthesia Postprocedure Evaluation (Signed)
Anesthesia Post Note  Patient: Daniel Raymond  Procedure(s) Performed: TRANSESOPHAGEAL ECHOCARDIOGRAM (TEE) (N/A )     Patient location during evaluation: Endoscopy Anesthesia Type: MAC Level of consciousness: awake and alert Pain management: pain level controlled Vital Signs Assessment: post-procedure vital signs reviewed and stable Respiratory status: spontaneous breathing, nonlabored ventilation, respiratory function stable and patient connected to nasal cannula oxygen Cardiovascular status: stable and blood pressure returned to baseline Postop Assessment: no apparent nausea or vomiting Anesthetic complications: no   No complications documented.  Last Vitals:  Vitals:   03/07/20 1500 03/07/20 1535  BP: 109/64 123/73  Pulse: 61 (!) 106  Resp: 17 15  Temp:  36.7 C  SpO2: 99% 95%    Last Pain:  Vitals:   03/07/20 1510  TempSrc:   PainSc: 0-No pain                 Kseniya Grunden S

## 2020-03-09 ENCOUNTER — Encounter (HOSPITAL_COMMUNITY): Payer: Self-pay | Admitting: Cardiovascular Disease

## 2020-03-09 LAB — CULTURE, BLOOD (ROUTINE X 2)
Culture: NO GROWTH
Culture: NO GROWTH
Special Requests: ADEQUATE
Special Requests: ADEQUATE
